# Patient Record
Sex: Female | Born: 1935 | Race: White | Hispanic: No | Smoking: Never smoker
Health system: Southern US, Community
[De-identification: ages and names within clinical notes are randomized; demographics above are authoritative.]

## PROBLEM LIST (undated history)

## (undated) DIAGNOSIS — F0390 Unspecified dementia without behavioral disturbance: Secondary | ICD-10-CM

## (undated) DIAGNOSIS — M810 Age-related osteoporosis without current pathological fracture: Secondary | ICD-10-CM

## (undated) DIAGNOSIS — I4821 Permanent atrial fibrillation: Secondary | ICD-10-CM

## (undated) DIAGNOSIS — W19XXXA Unspecified fall, initial encounter: Secondary | ICD-10-CM

## (undated) DIAGNOSIS — S22000A Wedge compression fracture of unspecified thoracic vertebra, initial encounter for closed fracture: Secondary | ICD-10-CM

## (undated) DIAGNOSIS — I5022 Chronic systolic (congestive) heart failure: Secondary | ICD-10-CM

## (undated) DIAGNOSIS — I110 Hypertensive heart disease with heart failure: Secondary | ICD-10-CM

## (undated) DIAGNOSIS — E785 Hyperlipidemia, unspecified: Secondary | ICD-10-CM

## (undated) DIAGNOSIS — E039 Hypothyroidism, unspecified: Secondary | ICD-10-CM

## (undated) DIAGNOSIS — I272 Pulmonary hypertension, unspecified: Secondary | ICD-10-CM

## (undated) DIAGNOSIS — R600 Localized edema: Secondary | ICD-10-CM

## (undated) DIAGNOSIS — I10 Essential (primary) hypertension: Secondary | ICD-10-CM

## (undated) HISTORY — DX: Permanent atrial fibrillation: I48.21

## (undated) HISTORY — DX: Pulmonary hypertension, unspecified: I27.20

## (undated) HISTORY — DX: Localized edema: R60.0

## (undated) HISTORY — PX: BREAST LUMPECTOMY: SHX2

---

## 1959-08-13 HISTORY — PX: OOPHORECTOMY: SHX86

## 1997-10-31 ENCOUNTER — Other Ambulatory Visit: Admission: RE | Admit: 1997-10-31 | Discharge: 1997-10-31 | Payer: Self-pay | Admitting: Endocrinology

## 1999-01-10 ENCOUNTER — Ambulatory Visit (HOSPITAL_COMMUNITY): Admission: RE | Admit: 1999-01-10 | Discharge: 1999-01-10 | Payer: Self-pay | Admitting: Endocrinology

## 1999-01-10 ENCOUNTER — Encounter: Payer: Self-pay | Admitting: Endocrinology

## 1999-03-01 ENCOUNTER — Other Ambulatory Visit: Admission: RE | Admit: 1999-03-01 | Discharge: 1999-03-01 | Payer: Self-pay | Admitting: Endocrinology

## 1999-08-17 ENCOUNTER — Ambulatory Visit (HOSPITAL_COMMUNITY): Admission: RE | Admit: 1999-08-17 | Discharge: 1999-08-17 | Payer: Self-pay | Admitting: *Deleted

## 1999-08-17 ENCOUNTER — Encounter (INDEPENDENT_AMBULATORY_CARE_PROVIDER_SITE_OTHER): Payer: Self-pay | Admitting: Specialist

## 2000-03-12 ENCOUNTER — Encounter: Payer: Self-pay | Admitting: Endocrinology

## 2000-03-12 ENCOUNTER — Ambulatory Visit (HOSPITAL_COMMUNITY): Admission: RE | Admit: 2000-03-12 | Discharge: 2000-03-12 | Payer: Self-pay | Admitting: Endocrinology

## 2000-03-24 ENCOUNTER — Other Ambulatory Visit: Admission: RE | Admit: 2000-03-24 | Discharge: 2000-03-24 | Payer: Self-pay | Admitting: Endocrinology

## 2001-04-01 ENCOUNTER — Other Ambulatory Visit: Admission: RE | Admit: 2001-04-01 | Discharge: 2001-04-01 | Payer: Self-pay | Admitting: Endocrinology

## 2001-04-08 ENCOUNTER — Ambulatory Visit (HOSPITAL_COMMUNITY): Admission: RE | Admit: 2001-04-08 | Discharge: 2001-04-08 | Payer: Self-pay | Admitting: Endocrinology

## 2001-04-08 ENCOUNTER — Encounter: Payer: Self-pay | Admitting: Endocrinology

## 2001-05-18 ENCOUNTER — Encounter: Payer: Self-pay | Admitting: Endocrinology

## 2001-05-18 ENCOUNTER — Ambulatory Visit (HOSPITAL_COMMUNITY): Admission: RE | Admit: 2001-05-18 | Discharge: 2001-05-18 | Payer: Self-pay | Admitting: Endocrinology

## 2001-05-21 ENCOUNTER — Encounter: Payer: Self-pay | Admitting: Endocrinology

## 2001-05-21 ENCOUNTER — Encounter: Admission: RE | Admit: 2001-05-21 | Discharge: 2001-05-21 | Payer: Self-pay | Admitting: Endocrinology

## 2002-04-05 ENCOUNTER — Other Ambulatory Visit: Admission: RE | Admit: 2002-04-05 | Discharge: 2002-04-05 | Payer: Self-pay | Admitting: Endocrinology

## 2002-06-02 ENCOUNTER — Ambulatory Visit (HOSPITAL_COMMUNITY): Admission: RE | Admit: 2002-06-02 | Discharge: 2002-06-02 | Payer: Self-pay | Admitting: *Deleted

## 2002-06-08 ENCOUNTER — Encounter: Admission: RE | Admit: 2002-06-08 | Discharge: 2002-06-08 | Payer: Self-pay | Admitting: Endocrinology

## 2002-06-08 ENCOUNTER — Encounter: Payer: Self-pay | Admitting: Endocrinology

## 2003-01-13 ENCOUNTER — Emergency Department (HOSPITAL_COMMUNITY): Admission: EM | Admit: 2003-01-13 | Discharge: 2003-01-13 | Payer: Self-pay | Admitting: Emergency Medicine

## 2003-01-13 ENCOUNTER — Encounter: Payer: Self-pay | Admitting: Emergency Medicine

## 2003-06-15 ENCOUNTER — Ambulatory Visit (HOSPITAL_COMMUNITY): Admission: RE | Admit: 2003-06-15 | Discharge: 2003-06-15 | Payer: Self-pay | Admitting: Endocrinology

## 2003-06-29 ENCOUNTER — Encounter: Admission: RE | Admit: 2003-06-29 | Discharge: 2003-06-29 | Payer: Self-pay | Admitting: Endocrinology

## 2004-06-18 ENCOUNTER — Ambulatory Visit (HOSPITAL_COMMUNITY): Admission: RE | Admit: 2004-06-18 | Discharge: 2004-06-18 | Payer: Self-pay | Admitting: Endocrinology

## 2004-06-29 ENCOUNTER — Encounter: Admission: RE | Admit: 2004-06-29 | Discharge: 2004-06-29 | Payer: Self-pay | Admitting: Endocrinology

## 2005-04-25 ENCOUNTER — Encounter: Admission: RE | Admit: 2005-04-25 | Discharge: 2005-04-25 | Payer: Self-pay | Admitting: Endocrinology

## 2005-08-14 ENCOUNTER — Encounter: Admission: RE | Admit: 2005-08-14 | Discharge: 2005-08-14 | Payer: Self-pay | Admitting: Endocrinology

## 2005-09-02 ENCOUNTER — Encounter: Admission: RE | Admit: 2005-09-02 | Discharge: 2005-09-02 | Payer: Self-pay | Admitting: Endocrinology

## 2006-08-12 HISTORY — PX: CATARACT EXTRACTION: SUR2

## 2007-07-24 ENCOUNTER — Ambulatory Visit (HOSPITAL_COMMUNITY): Admission: RE | Admit: 2007-07-24 | Discharge: 2007-07-24 | Payer: Self-pay | Admitting: *Deleted

## 2007-07-24 ENCOUNTER — Encounter (INDEPENDENT_AMBULATORY_CARE_PROVIDER_SITE_OTHER): Payer: Self-pay | Admitting: *Deleted

## 2007-08-07 ENCOUNTER — Encounter: Admission: RE | Admit: 2007-08-07 | Discharge: 2007-08-07 | Payer: Self-pay | Admitting: Endocrinology

## 2008-08-08 ENCOUNTER — Encounter: Admission: RE | Admit: 2008-08-08 | Discharge: 2008-08-08 | Payer: Self-pay | Admitting: Endocrinology

## 2009-05-12 ENCOUNTER — Encounter: Admission: RE | Admit: 2009-05-12 | Discharge: 2009-05-12 | Payer: Self-pay | Admitting: Endocrinology

## 2009-08-25 ENCOUNTER — Encounter: Admission: RE | Admit: 2009-08-25 | Discharge: 2009-08-25 | Payer: Self-pay | Admitting: Endocrinology

## 2009-08-31 ENCOUNTER — Encounter: Admission: RE | Admit: 2009-08-31 | Discharge: 2009-08-31 | Payer: Self-pay | Admitting: Endocrinology

## 2010-01-31 ENCOUNTER — Encounter: Payer: Self-pay | Admitting: Pulmonary Disease

## 2010-02-15 ENCOUNTER — Encounter: Payer: Self-pay | Admitting: Pulmonary Disease

## 2010-02-15 ENCOUNTER — Ambulatory Visit (HOSPITAL_COMMUNITY): Admission: RE | Admit: 2010-02-15 | Discharge: 2010-02-15 | Payer: Self-pay | Admitting: Cardiovascular Disease

## 2010-03-05 ENCOUNTER — Encounter: Payer: Self-pay | Admitting: Pulmonary Disease

## 2010-03-05 ENCOUNTER — Encounter: Admission: RE | Admit: 2010-03-05 | Discharge: 2010-03-05 | Payer: Self-pay | Admitting: Cardiovascular Disease

## 2010-03-21 DIAGNOSIS — I4821 Permanent atrial fibrillation: Secondary | ICD-10-CM | POA: Insufficient documentation

## 2010-03-22 ENCOUNTER — Ambulatory Visit: Payer: Self-pay | Admitting: Pulmonary Disease

## 2010-03-22 DIAGNOSIS — R0609 Other forms of dyspnea: Secondary | ICD-10-CM | POA: Insufficient documentation

## 2010-03-22 DIAGNOSIS — I10 Essential (primary) hypertension: Secondary | ICD-10-CM | POA: Insufficient documentation

## 2010-03-22 DIAGNOSIS — R0989 Other specified symptoms and signs involving the circulatory and respiratory systems: Secondary | ICD-10-CM

## 2010-03-22 DIAGNOSIS — I2721 Secondary pulmonary arterial hypertension: Secondary | ICD-10-CM | POA: Insufficient documentation

## 2010-03-22 DIAGNOSIS — J309 Allergic rhinitis, unspecified: Secondary | ICD-10-CM | POA: Insufficient documentation

## 2010-03-23 ENCOUNTER — Ambulatory Visit: Payer: Self-pay | Admitting: Internal Medicine

## 2010-04-05 ENCOUNTER — Ambulatory Visit: Payer: Self-pay | Admitting: Pulmonary Disease

## 2010-04-24 ENCOUNTER — Telehealth (INDEPENDENT_AMBULATORY_CARE_PROVIDER_SITE_OTHER): Payer: Self-pay | Admitting: *Deleted

## 2010-05-07 ENCOUNTER — Telehealth (INDEPENDENT_AMBULATORY_CARE_PROVIDER_SITE_OTHER): Payer: Self-pay | Admitting: *Deleted

## 2010-05-16 ENCOUNTER — Ambulatory Visit (HOSPITAL_COMMUNITY): Admission: RE | Admit: 2010-05-16 | Discharge: 2010-05-16 | Payer: Self-pay | Admitting: Cardiovascular Disease

## 2010-05-16 ENCOUNTER — Encounter (INDEPENDENT_AMBULATORY_CARE_PROVIDER_SITE_OTHER): Payer: Self-pay | Admitting: Internal Medicine

## 2010-06-08 ENCOUNTER — Encounter: Admission: RE | Admit: 2010-06-08 | Discharge: 2010-06-08 | Payer: Self-pay | Admitting: Cardiovascular Disease

## 2010-06-13 ENCOUNTER — Ambulatory Visit (HOSPITAL_COMMUNITY): Admission: RE | Admit: 2010-06-13 | Discharge: 2010-06-14 | Payer: Self-pay | Admitting: Cardiovascular Disease

## 2010-06-13 HISTORY — PX: CARDIAC CATHETERIZATION: SHX172

## 2010-06-20 ENCOUNTER — Encounter: Payer: Self-pay | Admitting: Pulmonary Disease

## 2010-07-27 ENCOUNTER — Ambulatory Visit: Payer: Self-pay | Admitting: Pulmonary Disease

## 2010-07-30 ENCOUNTER — Telehealth: Payer: Self-pay | Admitting: Pulmonary Disease

## 2010-08-24 ENCOUNTER — Ambulatory Visit
Admission: RE | Admit: 2010-08-24 | Discharge: 2010-08-24 | Payer: Self-pay | Source: Home / Self Care | Attending: Pulmonary Disease | Admitting: Pulmonary Disease

## 2010-09-11 NOTE — Progress Notes (Signed)
Summary: PT HAS STOPPED USING FORADIL  Phone Note Call from Patient Call back at Home Phone [phone]   Caller: Patient Call For: [facility] Summary of Call: pt states that since speaking to nurse on 9/13 (re: foradil) pt had problems w/ foradil. says that her bp on 9/20 was 159 / 102 and pulse was 67. pt says that on 9/19 she was very dizzy after using foradil and so she has not used it since 9/20. pt also states that she had mucle cramps in both arms after taking the foradil 9/19 and still had them the next day as well. (hasn't had since).  Initial call taken by: Tivis Ringer, CNA,  May 07, 2010 2:37 PM  Follow-up for Phone Call        called and spoke with pt.  pt states when she last called KC to give an update on Foradil (see phone note from 9-13 for complete details) pt states the Foradil was helping her breathing.  However, pt states on 04/30/2010 she took her morning dose of Foradil and states shortly after that she got dizzy and noticed muscle cramps in both arms.  Her bp was also elevated to 159/102.  Pt states she stopped taking the Foradil after this happened (hasn't restarted) and states her dizziness is gone, muscle cramps are gone and bp is back down to "normal."  Pt wonders if these were side effects to the [place].  pt would like to try something different if possible. Will forward message to [facility] to address.  Aundra Millet Reynolds LPN  May 07, 2010 2:54 PM   Additional Follow-up for Phone Call Additional follow up Details #1::        tell her it is ok to stay off foradil.  I do not think her lungs are the main culprit with her sob. Additional Follow-up by: Barbaraann Share MD,  May 07, 2010 4:52 PM    Additional Follow-up for Phone Call Additional follow up Details #2::    called and spoke with pt.  pt aware of KC's recs to ok to stay of Foradil.  Pt verbalized understanding.  Instructed pt to call our office if she has increased sob and feels she needs to be seen  sooner than her scheduled ov with [facility] in Jan 2012.  Pt was agreeable to this.  Aundra Millet Reynolds LPN  May 07, 2010 4:58 PM

## 2010-09-11 NOTE — Progress Notes (Signed)
Summary: foradil helping dyspnea  Phone Note Call from Patient   Caller: Patient Call For: clance Summary of Call: pt was told to call with update on foradil , breathing better should she continue taking . has note been sent to cardiologist? Initial call taken by: Rickard Patience,  April 24, 2010 10:58 AM  Follow-up for Phone Call        pt states she has seen some inprovement on foradil she states on a scale of 1-10 one being no SOB, 10 being the worst ever, she states she feels she is between a 4-5. She is able to walk further before she has to stop when she is exercising. Pt is requesting refill of foradil be sent to [facility] [address]. Carron Curie CMA  April 24, 2010 11:05 AM   Additional Follow-up for Phone Call Additional follow up Details #1::        let her know that a note has already been sent to cardiologist. she can have a refill on foradil #60, 3 fills she needs ov with me in 4mos to check on her progress. Additional Follow-up by: Barbaraann Share MD,  April 25, 2010 4:18 PM    Additional Follow-up for Phone Call Additional follow up Details #2::    Rx sent.  Called, spoke with pt. Informed her of above per KC.  She verbalized understanding.  ROV scheduled for 1.13.12 at 11am -- pt aware.   Follow-up by: Gweneth Dimitri RN,  April 25, 2010 4:27 PM  New/Updated Medications: FORADIL AEROLIZER 12 MCG CAPS (FORMOTEROL FUMARATE) Inhale 1 inhalation in am and in the pm Prescriptions: FORADIL AEROLIZER 12 MCG CAPS (FORMOTEROL FUMARATE) Inhale 1 inhalation in am and in the pm  #60 x 3   Entered by:   Gweneth Dimitri RN   Authorized by:   Barbaraann Share MD   Signed by:   Gweneth Dimitri RN on 04/25/2010   Method used:   Electronically to        [facility] [phone]* (retail)       [address]       [place], [place]  [address]       Ph: [phone] or [phone]       Fax: [phone]   RxID:   [id number]

## 2010-09-11 NOTE — Miscellaneous (Signed)
Summary: Orders Update pft charges  Clinical Lists Changes  Orders: Added new Service order of Carbon Monoxide diffusing w/capacity (94720) - Signed Added new Service order of Lung Volumes (94240) - Signed Added new Service order of Spirometry (Pre & Post) (94060) - Signed 

## 2010-09-11 NOTE — Assessment & Plan Note (Signed)
Summary: rov for dyspnea   Visit Type:  Follow-up Copy to:  [facility] Primary Provider/Referring Provider:  Dr. Juleen China  CC:  Dyspnea follow-up with PFT's.  the patient says her breathing is the same...no better or worse.Marland Kitchen  History of Present Illness: The pt comes in today for f/u of her recent ct chest and pfts as part of a w/u for doe.  She was found to have no ISLD on HRCT, and minimal scarring.  She did have a 3mm nodule noted, and significant kyphosis.  Her pfts showed minimal obstruction, no restriction, and a moderate decrease in dlco which corrected with alveolar volume adjustment.  I have reviewed the data in detail with her, and answered all of her questions.  Current Medications (verified): 1)  Digoxin 0.125 Mg Tabs (Digoxin) .... Take 1 Tablet By Mouth Once A Day 2)  Simvastatin 40 Mg Tabs (Simvastatin) .... Take 1 Tablet By Mouth Once A Day 3)  Metoprolol Tartrate 100 Mg Tabs (Metoprolol Tartrate) .... Take 1 Tablet By Mouth Two Times A Day 4)  Synthroid 125 Mcg Tabs (Levothyroxine Sodium) .... Take 1 Tablet By Mouth Once A Day 5)  Coumadin 2 Mg Tabs (Warfarin Sodium) .... Take As Directed By Coumadin Clinic 6)  Furosemide 40 Mg Tabs (Furosemide) .... Take 1 Tablet By Mouth Once A Day 7)  Fish Oil 1200 Mg Caps (Omega-3 Fatty Acids) .... Take 1 Tablet By Mouth Once A Day 8)  Calcium Plus Vitamin D3 600-500 Mg-Unit Caps (Calcium Carb-Cholecalciferol) .... Take 1 Tablet By Mouth Two Times A Day  Allergies (verified): No Known Drug Allergies  Past History:  Past medical, surgical, family and social histories (including risk factors) reviewed, and no changes noted (except as noted below).  Past Medical History: Reviewed history from 03/22/2010 and no changes required.  HYPERTENSION (ICD-401.9) ALLERGIC RHINITIS (ICD-477.9) Hx of ATRIAL FIBRILLATION (ICD-427.31)    Past Surgical History: Reviewed history from 03/22/2010 and no changes required. L breast surgery  1970s L ovary and tube removed 1974  Family History: Reviewed history from 03/22/2010 and no changes required. heart disease: mother- deceased at age 39 cancer: brother (melanoma) TB: paternal uncle  Social History: Reviewed history from 03/22/2010 and no changes required. Patient never smoked.  pt is widowed and lives alone. pt had 1 adopted son, now deceased. pt is retired.  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   Review of Systems       The patient complains of irregular heartbeats, hand/feet swelling, and joint stiffness or pain.  The patient denies shortness of breath with activity, shortness of breath at rest, productive cough, non-productive cough, coughing up blood, chest pain, acid heartburn, indigestion, loss of appetite, weight change, abdominal pain, difficulty swallowing, sore throat, tooth/dental problems, headaches, nasal congestion/difficulty breathing through nose, sneezing, itching, ear ache, anxiety, depression, rash, change in color of mucus, and fever.    Vital Signs:  Patient profile:   77 year old female Height:      63 inches (160.02 cm) Weight:      157 pounds (71.36 kg) BMI:     27.91 O2 Sat:      95 % on Room air Temp:     97.7 degrees F (36.50 degrees C) oral Pulse rate:   101 / minute BP sitting:   128 / 78  (left arm) Cuff size:   regular  Vitals Entered By: Michel Bickers CMA (April 05, 2010 9:56 AM)  O2 Sat  at Rest %:  95 O2 Flow:  Room air CC: Dyspnea follow-up with PFT's.  the patient says her breathing is the same...no better or worse. Comments Medications reviewed with the patient. Daytime phone verified. Michel Bickers CMA  April 05, 2010 10:03 AM   Physical Exam  General:  wd female in nad Lungs:  faint basilar crackles, no wheezing or rhonchi Heart:  irreg rhythm with rate approx 110. Extremities:  + edema, no cyanosis  Neurologic:  alert and oriented, moves all 4.   Impression &  Recommendations:  Problem # 1:  DYSPNEA ON EXERTION (ICD-786.09) the pt has no ISLD on HRCT, and her pfts show minimal obstruction and no restricton.  I suspect her degree of obstruction has nothing to do with her symptom of dyspnea.  I am willing to give her a trial of a LABA to see if she sees improvement in her breathing, but if she does not, no further pulmonary workup is recommended.  I believe she has pulmonary venous htn that is cardiac in origin, but will leave the decision regarding right heart cath to her cardiologist.    Other Orders: Est. Patient Level IV ([address])  Patient Instructions: 1)  will give you a trial of foradil  one inhalation am and pm for next 2 weeks.  Please call and let me know if you think things are improved. 2)  will send a note back to your cardiologist detailing my findings so that he can review.

## 2010-09-11 NOTE — Assessment & Plan Note (Signed)
Summary: consult for dyspnea   Copy to:  [facility] Primary Provider/Referring Provider:  Dr. Juleen China  CC:  Pulmonary Consult.  History of Present Illness: The pt is an 77y/o female who I have been asked to see for dyspnea.  She has noticed over the last 2 years a gradual onset of doe.  She has no sob at rest, and is able to walk distances if she is able to take breaks and rest stops.  She describes a one block doe on flat ground at a moderate pace, and will get winded bringing groceries in from the car and with sweeping a room of her house.  She denies any cough or congestion, and has no mucus production.  She has had a recent cxr which shows prominent bronchovascular markings bilaterally, CM, and significant kyphosis.  She has had a recent echo which showed normal LV systolic function, but ?impaired relaxation.  Her LA was moderate to severely dilated, and her PA pressure was estimated at 36mm (mildly elevated).  She has also had spirometry which shows a decrease in her FVC and FEV1, but her ratio was normal.  She did not have an expiratory time of 6 seconds, therefore the results are essentially uninterpretable.  The pt is unsure if she has ever been on amiodarone, but denies chronic macrodantin use.  She has not had any unusual occupational exposures.  She denies any h/o autoimmune disease, and only has minimal arthritis symptoms.  She has noted an unusual feeling in her rib cage during breathing, and felt it was due to her worsening spine issues.  Preventive Screening-Counseling & Management  Alcohol-Tobacco     Smoking Status: never  Medications Prior to Update: 1)  Digoxin 0.125 Mg Tabs (Digoxin) .... Take 1 Tablet By Mouth Once A Day 2)  Simvastatin 40 Mg Tabs (Simvastatin) .... Take 1 Tablet By Mouth Once A Day 3)  Metoprolol Tartrate 100 Mg Tabs (Metoprolol Tartrate) .... Take 1 Tablet By Mouth Two Times A Day 4)  Synthroid 125 Mcg Tabs (Levothyroxine Sodium) .... Take 1 Tablet By  Mouth Once A Day 5)  Coumadin 2 Mg Tabs (Warfarin Sodium) .... Take As Directed By Coumadin Clinic 6)  Furosemide 40 Mg Tabs (Furosemide) .... Take 1 Tablet By Mouth Once A Day 7)  Fish Oil 1200 Mg Caps (Omega-3 Fatty Acids) .... Take 1 Tablet By Mouth Once A Day 8)  Calcium Plus Vitamin D3 600-500 Mg-Unit Caps (Calcium Carb-Cholecalciferol) .... Take 1 Tablet By Mouth Two Times A Day  Allergies (verified): No Known Drug Allergies  Past History:  Past Medical History:  HYPERTENSION (ICD-401.9) ALLERGIC RHINITIS (ICD-477.9) Hx of ATRIAL FIBRILLATION (ICD-427.31)    Past Surgical History: L breast surgery 1970s L ovary and tube removed 1974  Family History: heart disease: mother- deceased at age 53 cancer: brother (melanoma) TB: paternal uncle  Social History: Patient never smoked.  pt is widowed and lives alone. pt had 1 adopted son, now deceased. pt is retired.  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 Smoking Status:  never  Review of Systems       The patient complains of shortness of breath with activity, irregular heartbeats, loss of appetite, sneezing, itching, and hand/feet swelling.  The patient denies shortness of breath at rest, productive cough, non-productive cough, coughing up blood, chest pain, acid heartburn, indigestion, weight change, abdominal pain, difficulty swallowing, sore throat, tooth/dental problems, headaches, nasal congestion/difficulty breathing through nose, ear ache, anxiety, depression,  joint stiffness or pain, rash, change in color of mucus, and fever.    Vital Signs:  Patient profile:   77 year old female Height:      63 inches Weight:      156.50 pounds BMI:     27.82 O2 Sat:      94 % on Room air Temp:     97.8 degrees F oral Pulse rate:   94 / minute BP sitting:   132 / 76  (left arm) Cuff size:   regular  Vitals Entered By: Arman Filter LPN (March 22, 2010 9:48 AM)  O2 Flow:  Room air CC:  Pulmonary Consult Comments Medications reviewed with patient Arman Filter LPN  March 22, 2010 9:48 AM    Physical Exam  General:  ow female in nad Eyes:  PERRLA and EOMI.   Nose:  narrowed but patent Mouth:  clear  Neck:  no jvd, LN.  mild prominence of thyroid Chest Wall:  significant kyphosis Lungs:  minimal left basilar crackles, decreased depth inspiration, otherwise clear Heart:  irreg rate and rhythm, cvr. Abdomen:  soft and nontender, bs+ Extremities:  1-2+  edema, no cyanosis pulses intact distally Neurologic:  alert and oriented, moves    Impression & Recommendations:  Problem # 1:  DYSPNEA ON EXERTION (ICD-786.09) the pt has significant doe, but it is unclear if this is due to a pulmonary issue.  Her spirometry is really uninterpretable due to effort and technique, and she will need full pfts anyway for evaluation.  Her cxr shows prominent bronchovascular markings, commonly seen in pulmonary venous hypertension ( esp. seen with MV disease), but I will reserve judgement until I see a HRCT to evaluate lung parenchyma.  She also has signficant kyphosis, and this is a common cause of dyspnea related to restictive disease as well.  Will see the pt back once her ct and full pfts are done.  Problem # 2:  PULMONARY HYPERTENSION, SECONDARY (ICD-416.8) the pt has mildly elevated pulmonary artery pressures by echo, and in the face of a significantly dilated LA, is most likely pulmonary VENOUS in origin.  This could be verified by right heart catheterization.    Other Orders: Consultation Level V [phone]) Radiology Referral (Radiology) Pulmonary Referral (Pulmonary)  Patient Instructions: 1)  will check a scan of lung to evaluate for possible scarring 2)  will schedule for full breathing tests, and see you back on same day to review.

## 2010-09-11 NOTE — Letter (Signed)
Summary: [facility]  [facility]   Imported By: Lester Grayhawk 03/28/2010 08:57:14  _____________________________________________________________________  External Attachment:    Type:   Image     Comment:   External Document

## 2010-09-13 NOTE — Progress Notes (Signed)
Summary: nos appt  Phone Note Call from Patient   Caller: [email or web address]  Call For: clance Summary of Call: In ref to nos from 12/16, pt has appt on 1/13. Initial call taken by: Darletta Moll,  July 30, 2010 11:29 AM

## 2010-09-13 NOTE — Letter (Signed)
Summary: [facility]  [facility]   Imported By: Lester Weston 07/25/2010 10:39:11  _____________________________________________________________________  External Attachment:    Type:   Image     Comment:   External Document

## 2010-09-13 NOTE — Assessment & Plan Note (Signed)
Summary: rov for dyspnea   Visit Type:  Follow-up Copy to:  [facility] Primary Provider/Referring Provider:  Dr. Juleen China  CC:  4 month follow up. Pt states her breathing has been doing "good". Pt denies any cough. Pt states she wheezes when she is lying flat on her back. . Pt states she stopped her inhaler bc she had hand pain. Pt is never smoker.  History of Present Illness: The pt comes in today for f/u of her doe.  She has had pfts and other pulmonary w/u, with very mild airflow obstruction on pfts.  She was tried on foradil and felt there was some improvement in her breathing, but did not think the change was worth the side effects from the medication.  She has also had a right heart cath, and this showed mild pulmonary htn (mean 30) and mildly elevated PCWP.  She has a history of mitral insuff. that is mild to moderate by echo, and did have a mild v-wave at RHC of 21mm.  Her PVR calculated to be < 3 wood units, which really doesn't qualify her for vasodilator medications, especially given her age and mild pulmonary htn.  The pt denies any cough or congestion at this time.  Current Medications (verified): 1)  Digoxin 0.125 Mg Tabs (Digoxin) .... Take 1 Tablet By Mouth Once A Day 2)  Simvastatin 40 Mg Tabs (Simvastatin) .... Take 1 Tablet By Mouth Once A Day 3)  Metoprolol Tartrate 100 Mg Tabs (Metoprolol Tartrate) .... Take 1 Tablet By Mouth Two Times A Day 4)  Synthroid 125 Mcg Tabs (Levothyroxine Sodium) .... Take 1 Tablet By Mouth Once A Day 5)  Coumadin 2 Mg Tabs (Warfarin Sodium) .... Take As Directed By Coumadin Clinic.Marland Kitchen 4 Mg On Mondays and 2 Mg The Rest of The Week 6)  Furosemide 40 Mg Tabs (Furosemide) .... Take 1 Tablet By Mouth Once A Day 7)  Fish Oil 1200 Mg Caps (Omega-3 Fatty Acids) .... Take 1 Tablet By Mouth Once A Day 8)  Calcium Plus Vitamin D3 600-500 Mg-Unit Caps (Calcium Carb-Cholecalciferol) .... Take 1 Tablet By Mouth Two Times A Day 9)  Tylenol Extra Strength 500 Mg  Tabs (Acetaminophen) .... As Needed  Allergies (verified): No Known Drug Allergies  Past History:  Past medical, surgical, family and social histories (including risk factors) reviewed, and no changes noted (except as noted below).  Past Medical History:  HYPERTENSION (ICD-401.9) ALLERGIC RHINITIS (ICD-477.9) Hx of ATRIAL FIBRILLATION (ICD-427.31) DOE--ct chest with ISLD, pfts with mild obst, minimal restriction, and mod decrease dlco.  On coumadin            chronically for afib.  RHC with pcwp 17, v wave 21, mean pa 30, pvr 265 wood units    Past Surgical History: Reviewed history from 03/22/2010 and no changes required. L breast surgery 1970s L ovary and tube removed 1974  Family History: Reviewed history from 03/22/2010 and no changes required. heart disease: mother- deceased at age 53 cancer: brother (melanoma) TB: paternal uncle  Social History: Reviewed history from 03/22/2010 and no changes required. Patient never smoked.  pt is widowed and lives alone. pt had 1 adopted son, now deceased. pt is retired.  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   Review of Systems       The patient complains of shortness of breath with activity, weight change, nasal congestion/difficulty breathing through nose, and joint stiffness or pain.  The patient denies shortness  of breath at rest, productive cough, non-productive cough, coughing up blood, chest pain, irregular heartbeats, acid heartburn, indigestion, loss of appetite, abdominal pain, difficulty swallowing, sore throat, tooth/dental problems, headaches, sneezing, itching, ear ache, anxiety, depression, hand/feet swelling, rash, change in color of mucus, and fever.    Vital Signs:  Patient profile:   77 year old female Height:      63 inches Weight:      149 pounds BMI:     26.49 O2 Sat:      96 % on Room air Temp:     98.4 degrees F oral Pulse rate:   79 / minute BP sitting:   124 / 78   (left arm) Cuff size:   regular  Vitals Entered By: Carver Fila (August 24, 2010 11:06 AM)  O2 Flow:  Room air CC: 4 month follow up. Pt states her breathing has been doing "good". Pt denies any cough. Pt states she wheezes when she is lying flat on her back. . Pt states she stopped her inhaler bc she had hand pain. Pt is never smoker Comments meds and allergies updated Phone number updated Carver Fila  August 24, 2010 11:06 AM    Physical Exam  General:  wd female in nad Nose:  no purulence or discharge noted. Lungs:  totally clear to auscultation Heart:  rrr Extremities:  1+ edema LE, no cyanosis  Neurologic:  alert and oriented, moves all 4.   Impression & Recommendations:  Problem # 1:  DYSPNEA ON EXERTION (ICD-786.09) Assessment Deteriorated I think the pt's doe is multifactorial.  She does have very mild obstructive disease with some response to bronchodilators, but does not want to take chronic meds due to side effects.  The pt also is deconditioned, and has mild pulmonary htn with mitral insuff.  Her pulmonary htn is not overly significant, and given her age would not treat with vasodilators.  I would be curious to know what her V-wave is during exertional activities, and whether this is a larger part of her sob than we think.  I have recommended to her that she have a rescue inhaler available to use as needed.  The only other thing I would consider is whether her Beta Blocker dose should be decreased given her mild obstructive disease??  Will leave that to her cardiologist and primary md.  Problem # 2:  PULMONARY HYPERTENSION, SECONDARY (ICD-416.8) as above.  Medications Added to Medication List This Visit: 1)  Coumadin 2 Mg Tabs (Warfarin sodium) .... Take as directed by coumadin clinic.. 4 mg on mondays and 2 mg the rest of the week 2)  Tylenol Extra Strength 500 Mg Tabs (Acetaminophen) .... As needed 3)  Ventolin Hfa 108 (90 Base) Mcg/act Aers (Albuterol sulfate) ....  2 puffs every 4-6 hours as needed  Other Orders: Est. Patient Level IV (75643)  Patient Instructions: 1)  stay off foradil 2)  will start on ventolin inhaler, 2 puffs up to every 6hrs if needed for worsening sob or emergencies. 3)  work on increasing activities. 4)  would be happy to see again if having worsening symptoms.  Just let me know.  Prescriptions: VENTOLIN HFA 108 (90 BASE) MCG/ACT  AERS (ALBUTEROL SULFATE) 2 puffs every 4-6 hours as needed  #1 x 6   Entered and Authorized by:   Barbaraann Share MD   Signed by:   Barbaraann Share MD on 08/24/2010   Method used:   Print then Give to Patient  RxID:   [id number]    Immunization History:  Influenza Immunization History:    Influenza:  historical (05/12/2010)

## 2010-10-08 ENCOUNTER — Other Ambulatory Visit: Payer: Self-pay | Admitting: Endocrinology

## 2010-10-08 DIAGNOSIS — Z1231 Encounter for screening mammogram for malignant neoplasm of breast: Secondary | ICD-10-CM

## 2010-10-23 ENCOUNTER — Ambulatory Visit
Admission: RE | Admit: 2010-10-23 | Discharge: 2010-10-23 | Disposition: A | Discharge status: Home / Self Care | Payer: Medicare HMO | Source: Ambulatory Visit | Attending: Endocrinology | Admitting: Endocrinology

## 2010-10-23 DIAGNOSIS — Z1231 Encounter for screening mammogram for malignant neoplasm of breast: Secondary | ICD-10-CM

## 2010-10-23 LAB — PROTIME-INR
INR: 1 (ref 0.00–1.49)
INR: 1.07 (ref 0.00–1.49)
Prothrombin Time: 13.4 seconds (ref 11.6–15.2)
Prothrombin Time: 14.1 seconds (ref 11.6–15.2)

## 2010-10-23 LAB — POCT I-STAT 3, VENOUS BLOOD GAS (G3P V)
Acid-Base Excess: 2 mmol/L (ref 0.0–2.0)
Acid-base deficit: 2 mmol/L (ref 0.0–2.0)
Bicarbonate: 23.5 mEq/L (ref 20.0–24.0)
Bicarbonate: 26.8 mEq/L — ABNORMAL HIGH (ref 20.0–24.0)
O2 Saturation: 66 %
O2 Saturation: 69 %
TCO2: 25 mmol/L (ref 0–100)
TCO2: 28 mmol/L (ref 0–100)
pCO2, Ven: 42.4 mmHg — ABNORMAL LOW (ref 45.0–50.0)
pCO2, Ven: 42.7 mmHg — ABNORMAL LOW (ref 45.0–50.0)
pH, Ven: 7.352 — ABNORMAL HIGH (ref 7.250–7.300)
pH, Ven: 7.406 — ABNORMAL HIGH (ref 7.250–7.300)
pO2, Ven: 36 mmHg (ref 30.0–45.0)
pO2, Ven: 36 mmHg (ref 30.0–45.0)

## 2010-10-23 LAB — BASIC METABOLIC PANEL
BUN: 10 mg/dL (ref 6–23)
CO2: 29 mEq/L (ref 19–32)
Calcium: 8.9 mg/dL (ref 8.4–10.5)
Chloride: 108 mEq/L (ref 96–112)
Creatinine, Ser: 0.78 mg/dL (ref 0.4–1.2)
GFR calc Af Amer: >60 mL/min (ref >60)
GFR calc non Af Amer: >60 mL/min (ref >60)
Glucose, Bld: 97 mg/dL (ref 70–99)
Potassium: 3.6 mEq/L (ref 3.5–5.1)
Sodium: 142 mEq/L (ref 135–145)

## 2010-10-23 LAB — POCT I-STAT 3, ART BLOOD GAS (G3+)
Acid-Base Excess: 1 mmol/L (ref 0.0–2.0)
Bicarbonate: 24.7 mEq/L — ABNORMAL HIGH (ref 20.0–24.0)
O2 Saturation: 95 %
TCO2: 26 mmol/L (ref 0–100)
pCO2 arterial: 37 mmHg (ref 35.0–45.0)
pH, Arterial: 7.433 — ABNORMAL HIGH (ref 7.350–7.400)
pO2, Arterial: 71 mmHg — ABNORMAL LOW (ref 80.0–100.0)

## 2010-10-23 LAB — CBC
HCT: 35.4 % — ABNORMAL LOW (ref 36.0–46.0)
Hemoglobin: 11.6 g/dL — ABNORMAL LOW (ref 12.0–15.0)
MCH: 29.5 pg (ref 26.0–34.0)
MCHC: 32.8 g/dL (ref 30.0–36.0)
MCV: 90.1 fL (ref 78.0–100.0)
Platelets: 160 10*3/uL (ref 150–400)
RBC: 3.93 MIL/uL (ref 3.87–5.11)
RDW: 14.1 % (ref 11.5–15.5)
WBC: 5.2 10*3/uL (ref 4.0–10.5)

## 2010-11-14 ENCOUNTER — Other Ambulatory Visit: Payer: Self-pay | Admitting: Endocrinology

## 2010-11-14 DIAGNOSIS — E041 Nontoxic single thyroid nodule: Secondary | ICD-10-CM

## 2010-11-16 ENCOUNTER — Ambulatory Visit
Admission: RE | Admit: 2010-11-16 | Discharge: 2010-11-16 | Disposition: A | Discharge status: Home / Self Care | Payer: Medicare HMO | Source: Ambulatory Visit | Attending: Endocrinology | Admitting: Endocrinology

## 2010-11-16 DIAGNOSIS — E041 Nontoxic single thyroid nodule: Secondary | ICD-10-CM

## 2010-12-25 NOTE — Op Note (Signed)
NAME:  Wendy Flowers, Wendy Flowers                   ACCOUNT NO.:  [id number]   MEDICAL RECORD NO.:  [id number]          PATIENT TYPE:  AMB   LOCATION:  ENDO                         FACILITY:  [facility]   PHYSICIAN:  Georgiana Spinner, M.D.    DATE OF BIRTH:  [birth year]   DATE OF PROCEDURE:  DATE OF DISCHARGE:                               OPERATIVE REPORT   PROCEDURE:  Colonoscopy.   INDICATIONS:  Colon polyps.   ANESTHESIA:  Fentanyl 70 mcg, Versed 5 mg.   PROCEDURE:  With the patient mildly sedated in the left lateral  decubitus position, the Pentax videoscopic colonoscope was inserted in  the rectum and passed under direct vision through a diverticular filled  sigmoid colon to reach the cecum, identified by ileocecal valve and  appendiceal orifice, both of which were photographed.  From this point  the colonoscope was slowly withdrawn, taking circumferential views of  the colonic mucosa, stopping in the descending colon where two small  polyps were seen, photographed, and removed using hot biopsy forceps  technique, setting of 20/150 blended current.  We then stopped in the  rectum which appeared normal on direct and showed hemorrhoids on  retroflexed view.  The endoscope was straightened and withdrawn.  The  patient's vital signs and pulse oximeter remained stable.  The patient  tolerated the procedure well without apparent complications.   FINDINGS:  1. Significant diverticulosis of the sigmoid colon.  2. Polyps of the descending colon.  3. Internal hemorrhoids.   PLAN:  Await biopsy report.  The patient will call me for results and  follow up with me as an outpatient.  Of note, the polyp tissue bled just  from touching the polyp, so we will check a pro time to determine the  anticoagulation status before recommending when to restart the Coumadin.           ______________________________  Georgiana Spinner, M.D.     GMO/MEDQ  D:  07/24/2007  T:  07/24/2007  Job:  [id number]

## 2010-12-28 NOTE — Op Note (Signed)
   NAME:  Wendy Flowers, Wendy Flowers                             ACCOUNT NO.:  [id number]   MEDICAL RECORD NO.:  [id number]                   PATIENT TYPE:  AMB   LOCATION:  ENDO                                 FACILITY:  [facility]   PHYSICIAN:  Georgiana Spinner, M.D.                 DATE OF BIRTH:  [birth year]   DATE OF PROCEDURE:  DATE OF DISCHARGE:                                 OPERATIVE REPORT   PROCEDURE:  Colonoscopy.   INDICATIONS FOR PROCEDURE:  Colon polyps.   ANESTHESIA:  Demerol 50, Versed 6 mg.   DESCRIPTION OF PROCEDURE:  With the patient mildly sedated in the left  lateral decubitus position, the Olympus videoscopic colonoscope was inserted  in the rectum and passed through a tortuous diverticular filled sigmoid  colon to the cecum identified by the ileocecal valve and appendiceal orifice  both of which were photographed. From this point, the colonoscope was slowly  withdrawn taking circumferential views of the entire colonic mucosa stopping  in the rectum which appeared normal on direct and retroflexed view. The  endoscope was straightened and withdrawn. The patient's vital signs and  pulse oximeter remained stable. The patient tolerated the procedure well  without apparent complications.   FINDINGS:  Diverticulosis of sigmoid colon, otherwise, unremarkable exam.   PLAN:  Repeat examination in 5-10 years.                                                 Georgiana Spinner, M.D.    GMO/MEDQ  D:  06/02/2002  T:  06/02/2002  Job:  [id number]

## 2012-02-04 HISTORY — PX: US ECHOCARDIOGRAPHY: HXRAD669

## 2012-02-14 ENCOUNTER — Other Ambulatory Visit: Payer: Self-pay | Admitting: Endocrinology

## 2012-02-14 DIAGNOSIS — Z1231 Encounter for screening mammogram for malignant neoplasm of breast: Secondary | ICD-10-CM

## 2012-02-25 ENCOUNTER — Ambulatory Visit
Admission: RE | Admit: 2012-02-25 | Discharge: 2012-02-25 | Disposition: A | Discharge status: Home / Self Care | Payer: Medicare HMO | Source: Ambulatory Visit | Attending: Endocrinology | Admitting: Endocrinology

## 2012-02-25 DIAGNOSIS — Z1231 Encounter for screening mammogram for malignant neoplasm of breast: Secondary | ICD-10-CM

## 2013-03-16 ENCOUNTER — Encounter: Payer: Self-pay | Admitting: *Deleted

## 2013-03-17 ENCOUNTER — Ambulatory Visit (INDEPENDENT_AMBULATORY_CARE_PROVIDER_SITE_OTHER): Payer: Medicare HMO | Admitting: Cardiovascular Disease

## 2013-03-17 ENCOUNTER — Encounter: Payer: Self-pay | Admitting: Cardiovascular Disease

## 2013-03-17 VITALS — BP 124/82 | HR 75 | Resp 16 | Ht 61.25 in | Wt 152.8 lb

## 2013-03-17 DIAGNOSIS — R42 Dizziness and giddiness: Secondary | ICD-10-CM

## 2013-03-17 DIAGNOSIS — I4891 Unspecified atrial fibrillation: Secondary | ICD-10-CM

## 2013-03-17 DIAGNOSIS — R0602 Shortness of breath: Secondary | ICD-10-CM

## 2013-03-17 DIAGNOSIS — I2789 Other specified pulmonary heart diseases: Secondary | ICD-10-CM

## 2013-03-17 MED ORDER — MECLIZINE HCL 32 MG PO TABS: 32.0000 mg | ORAL_TABLET | Freq: Three times a day (TID) | ORAL | Status: DC | PRN

## 2013-03-17 NOTE — Patient Instructions (Addendum)
Your physician recommends that you schedule a follow-up appointment in: 6 months - call if you don't start feeling better.  Non-Cardiac CT scanning, (CAT scanning), is a noninvasive, special x-ray that produces cross-sectional images of the body using x-rays and a computer. CT scans help physicians diagnose and treat medical conditions. For some CT exams, a contrast material is used to enhance visibility in the area of the body being studied. CT scans provide greater clarity and reveal more details than regular x-ray exams.   A prescription for Meclinizine has been sent to your pharmacy for pick-up.  Take 3 times a day for dizziness as needed.

## 2013-03-18 ENCOUNTER — Other Ambulatory Visit: Payer: Self-pay | Admitting: Cardiovascular Disease

## 2013-03-18 ENCOUNTER — Encounter: Payer: Self-pay | Admitting: Cardiovascular Disease

## 2013-03-18 DIAGNOSIS — R42 Dizziness and giddiness: Secondary | ICD-10-CM | POA: Insufficient documentation

## 2013-03-18 MED ORDER — MECLIZINE HCL 25 MG PO TABS: 25.0000 mg | ORAL_TABLET | Freq: Three times a day (TID) | ORAL | Status: DC | PRN

## 2013-03-18 NOTE — Assessment & Plan Note (Signed)
She has mild to moderate pulmonary hypertension of uncertain etiology, possibly related to restrictive lung disease secondary to kyphosis. This does not appear to be a progressive problem. There was no evidence of heart failure by right heart catheterization. Degree of dyspnea is unchanged over time

## 2013-03-18 NOTE — Telephone Encounter (Signed)
New Rx sent in (corrected)

## 2013-03-18 NOTE — Progress Notes (Signed)
Patient ID: Wendy Flowers, female   DOB: 07-28-1927, 79 y.o.   MRN: [id number]     Reason for office visit New onset of vertigo  Wendy Flowers has long-standing mild to moderate pulmonary artery hypertension and permanent atrial fibrillation but presents today with complaints of vertigo. She was lying in bed on Monday morning and all of a sudden experienced a sensation of a "jolt". Immediately the room started spinning around her and she seems to very consistently described a counterclockwise pattern of rotation. She had the desire to immediately get out of bed but motion made her symptoms worse. She managed to get out of bed to a chair and sit down and check her heart rate which was 53 beats per minutes. She had mild nausea. She found her gait to be unsteady in her ability to judge distances altered. She seems to be describing some degree of ataxia. Her symptoms have improved but she still feels unsteady and uncoordinated. She did not have any vomiting although she did have some mild nausea at the peak of her symptoms.  Her chronic functional class I shortness of breath has not changed. She has intermittent, mild lower extremity edema, She has not had any bleeding problems while on warfarin anticoagulation. Her anticoagulation is followed by Virgina Evener at [facility].    Allergies  Allergen Reactions  . Foradil (Formoterol)     Current Outpatient Prescriptions  Medication Sig Dispense Refill  . Calcium Carb-Cholecalciferol (CALCIUM + D3) 600-200 MG-UNIT TABS Take 1 tablet by mouth 2 (two) times daily.      . digoxin (LANOXIN) 0.125 MG tablet Take 0.125 mg by mouth daily.      . furosemide (LASIX) 40 MG tablet Take 40 mg by mouth.      . levothyroxine (SYNTHROID, LEVOTHROID) 125 MCG tablet Take 125 mcg by mouth daily before breakfast.      . metoprolol (LOPRESSOR) 100 MG tablet Take 50 mg by mouth 2 (two) times daily.      . simvastatin (ZOCOR) 40 MG tablet Take 40 mg by mouth every evening.       . warfarin (COUMADIN) 4 MG tablet Take 2-4 mg by mouth as directed.      . meclizine (ANTIVERT) 25 MG tablet Take 1 tablet (25 mg total) by mouth 3 (three) times daily as needed.  30 tablet  0   No current facility-administered medications for this visit.    Past Medical History  Diagnosis Date  . Permanent atrial fibrillation   . Pulmonary hypertension   . Lower extremity edema     Past Surgical History  Procedure Laterality Date  . Oophorectomy  1961    left  . Breast lumpectomy  1073    left  . Cataract extraction  2008    bilateral  . Cardiac catheterization  06/13/2010    R & LHC: no CAD  . US echocardiography  02/04/2012    LA mod. dilated,RA mild-mod dilated,mild MR,mod. TR    Family History  Problem Relation Age of Onset  . Heart attack Mother   . Heart attack Father   . Diabetes Brother   . Cancer Brother     History   Social History  . Marital Status: Widowed    Spouse Name: N/A    Number of Children: N/A  . Years of Education: N/A   Occupational History  . Not on file.   Social History Main Topics  . Smoking status: Never Smoker   .  Smokeless tobacco: Never Used  . Alcohol Use: Yes     Comment: seldom; 03/17/2013-occasionally drinks a glass of wine before bed  . Drug Use: No  . Sexually Active: Not on file   Other Topics Concern  . Not on file   Social History Narrative  . No narrative on file    Review of systems: The patient specifically denies any chest pain at rest or with exertion, dyspnea at rest, orthopnea, paroxysmal nocturnal dyspnea, syncope, palpitations, intermittent claudication, unexplained weight gain, cough, hemoptysis or wheezing.  The patient also denies abdominal pain,  vomiting, dysphagia, diarrhea, constipation, polyuria, polydipsia, dysuria, hematuria, frequency, urgency, abnormal bleeding or bruising, fever, chills, unexpected weight changes, mood swings, change in skin or hair texture, change in voice quality, auditory  or visual problems, allergic reactions or rashes, new musculoskeletal complaints other than usual "aches and pains".   PHYSICAL EXAM BP 124/82  Pulse 75  Resp 16  Ht 5' 1.25" (1.556 m)  Wt 152 lb 12.8 oz (69.31 kg)  BMI 28.63 kg/m2  General: Alert, oriented x3, no distress Head: no evidence of trauma, PERRL, EOMI, no exophtalmos or lid lag, no myxedema, no xanthelasma; normal ears, nose and oropharynx Neck: normal jugular venous pulsations and no hepatojugular reflux; brisk carotid pulses without delay and no carotid bruits Chest: clear to auscultation, no signs of consolidation by percussion or palpation, normal fremitus, symmetrical and full respiratory excursions Cardiovascular: normal position and quality of the apical impulse, irregular rhythm, normal first and loud second heart sounds, no murmurs, rubs or gallops Abdomen: no tenderness or distention, no masses by palpation, no abnormal pulsatility or arterial bruits, normal bowel sounds, no hepatosplenomegaly Extremities: no clubbing, cyanosis; 1+ ankle bilateral edema; 2+ radial, ulnar and brachial pulses bilaterally; 2+ right femoral, posterior tibial and dorsalis pedis pulses; 2+ left femoral, posterior tibial and dorsalis pedis pulses; no subclavian or femoral bruits Neurological: grossly nonfocal   EKG: Atrial fibrillation with slow ventricular response otherwise normal tracing  Lipid Panel  Cholesterol 140, triglycerides 51, HDL 65, LDL 65 (October 2013)  BMET October 2013 creatinine 1.1 sodium 141 potassium 3.9 normal liver function tests glucose 103 normal thyroid function tests    Component Value Date/Time   NA 142 06/14/2010 0410   K 3.6 06/14/2010 0410   CL 108 06/14/2010 0410   CO2 29 06/14/2010 0410   GLUCOSE 97 06/14/2010 0410   BUN 10 06/14/2010 0410   CREATININE 0.78 06/14/2010 0410   CALCIUM 8.9 06/14/2010 0410   GFRNONAA >60 06/14/2010 0410   GFRAA  Value: >60        The eGFR has been calculated using the MDRD  equation. This calculation has not been validated in all clinical situations. eGFR's persistently <60 mL/min signify possible Chronic Kidney Disease. 06/14/2010 0410     ASSESSMENT AND PLAN PULMONARY HYPERTENSION, SECONDARY She has mild to moderate pulmonary hypertension of uncertain etiology, possibly related to restrictive lung disease secondary to kyphosis. This does not appear to be a progressive problem. There was no evidence of heart failure by right heart catheterization. Degree of dyspnea is unchanged over time  ATRIAL FIBRILLATION Permanent atrial fibrillation on chronic warfarin anticoagulation and beta blockers and digoxin for rate control. Her heart rate is actually excessively slow today and I worry about the potential toxic effects of digoxin in this octogenarian: have asked her to discontinue digoxin  Vertigo This may represent benign positional vertigo but the very abrupt onset of her symptoms and the absence of nausea  and nystagmus would be a little atypical. She may have some mild ataxia . She has permanent atrial fibrillation and is therefore at risk of stroke. I recommended that she have a head CT to exclude an embolic event. I have given her a prescription for meclizine to use as needed. If this doesn't help the problem and the CT is negative , she should see an ear nose and throat specialist.  Orders Placed This Encounter  Procedures  . CT Head Wo Contrast  . EKG 12-Lead   Meds ordered this encounter  Medications  . metoprolol (LOPRESSOR) 100 MG tablet    Sig: Take 50 mg by mouth 2 (two) times daily.  Marland Kitchen warfarin (COUMADIN) 4 MG tablet    Sig: Take 2-4 mg by mouth as directed.  . Calcium Carb-Cholecalciferol (CALCIUM + D3) 600-200 MG-UNIT TABS    Sig: Take 1 tablet by mouth 2 (two) times daily.  Marland Kitchen DISCONTD: meclizine (ANTIVERT) 32 MG tablet    Sig: Take 1 tablet (32 mg total) by mouth 3 (three) times daily as needed for dizziness.    Dispense:  30 tablet    Refill:   0    Arionna Hoggard  Thurmon Fair, MD, [facility] [phone] office [phone] pager

## 2013-03-18 NOTE — Assessment & Plan Note (Addendum)
This may represent benign positional vertigo but the very abrupt onset of her symptoms and the absence of nausea and nystagmus would be a little atypical. She may have some mild ataxia . She has permanent atrial fibrillation and is therefore at risk of stroke. I recommended that she have a head CT to exclude an embolic event. I have given her a prescription for meclizine to use as needed. If this doesn't help the problem and the CT is negative , she should see an ear nose and throat specialist.

## 2013-03-18 NOTE — Assessment & Plan Note (Signed)
Permanent atrial fibrillation on chronic warfarin anticoagulation and beta blockers and digoxin for rate control. Her heart rate is actually excessively slow today and I worry about the potential toxic effects of digoxin in this octogenarian: have asked her to discontinue digoxin

## 2013-03-18 NOTE — Telephone Encounter (Signed)
Was her yesterday-had an ear infection-Dr C said he was going to call her a prescription in-the medicine he prescribed the pharmacist said it did not come in that milligram-still have not gotten her mediiine.She says she really needs this asap.

## 2013-03-18 NOTE — Telephone Encounter (Signed)
New Rx sent in for Meclizine 25mg   TID PRN w/no refills.

## 2013-03-18 NOTE — Telephone Encounter (Signed)
Call to pharmacy and informed Meclizine comes in 12.5 mg or 25 mg.  Will need to resend Rx.    Message forwarded to Dr. Royann Shivers.

## 2013-03-22 ENCOUNTER — Ambulatory Visit
Admission: RE | Admit: 2013-03-22 | Discharge: 2013-03-22 | Disposition: A | Discharge status: Home / Self Care | Payer: Medicare HMO | Source: Ambulatory Visit | Attending: Cardiovascular Disease | Admitting: Cardiovascular Disease

## 2013-03-22 DIAGNOSIS — R42 Dizziness and giddiness: Secondary | ICD-10-CM

## 2013-05-14 ENCOUNTER — Ambulatory Visit (INDEPENDENT_AMBULATORY_CARE_PROVIDER_SITE_OTHER): Payer: Medicare HMO | Admitting: Cardiovascular Disease

## 2013-05-14 VITALS — BP 136/86 | Resp 16 | Ht 64.0 in | Wt 149.0 lb

## 2013-05-14 DIAGNOSIS — I2789 Other specified pulmonary heart diseases: Secondary | ICD-10-CM

## 2013-05-14 DIAGNOSIS — R413 Other amnesia: Secondary | ICD-10-CM

## 2013-05-14 DIAGNOSIS — I4891 Unspecified atrial fibrillation: Secondary | ICD-10-CM

## 2013-05-14 NOTE — Patient Instructions (Addendum)
Your physician recommends that you schedule a follow-up appointment in: 6 months  

## 2013-05-15 ENCOUNTER — Encounter: Payer: Self-pay | Admitting: Cardiovascular Disease

## 2013-05-15 DIAGNOSIS — R413 Other amnesia: Secondary | ICD-10-CM | POA: Insufficient documentation

## 2013-05-15 NOTE — Assessment & Plan Note (Signed)
Barring any bleeding complications she should remain on lifelong warfarin anticoagulation. Ventricular rate is well controlled.

## 2013-05-15 NOTE — Assessment & Plan Note (Signed)
Stable, mild to moderate pulmonary hypertension may be secondary to restrictive lung disease secondary to kyphosis. Continue observation. Vasodilator therapy is unlikely to be a beneficial intervention.

## 2013-05-15 NOTE — Assessment & Plan Note (Signed)
She has noticed short-term memory lapses recently. She believes this might be related to her vertigo, but I told her that this is unlikely. I am unable to discern whether she truly has some issues with dementia or simple absentmindedness by our interview today. I have encouraged her to discuss a Mini-Mental Status Examination when she next needs with her primary care physician, Dr. Juleen China.

## 2013-05-15 NOTE — Progress Notes (Signed)
Patient ID: Wendy Flowers, female   DOB: 07/24/1927, 79 y.o.   MRN: [id number]     Reason for office visit Pulmonary hypertension, permanent atrial fibrillation  Wendy Flowers has no change in her clinical status compared with her visit in October last year. She continues to have mild to moderate shortness of breath on exertion (functional class II) and mild to moderate lower shunting edema. She remains in atrial fibrillation which appears to be a permanent arrhythmia and has not had any bleeding complications while on warfarin anticoagulation. She has not had a stroke or TIA. She continues to have problems with ankle swelling but it is no different than the past. She has not had any sores on her legs or episodes of cellulitis.  She has mild to moderate pulmonary hypertension confirmed by right heart catheterization (PA pressure 49/20, mean 30, PA wedge pressure mean 16 mm Hg), but this appears to be stable in severity and is not clearly explained. The most likely cause is restrictive lung disease from fairly significant thoracic kyphoscoliosis in turn related to osteoporosis. She does not have a history of smoking does not have signs or symptoms of obstructive sleep apnea and has never had known venous thromboembolic events. A summary workup for inflammatory/autoimmune disorders in the past has been unrevealing. Her forced vital capacity is roughly 62% of predicted and does not change with bronchodilator therapy.    Allergies  Allergen Reactions  . Foradil [Formoterol]     Current Outpatient Prescriptions  Medication Sig Dispense Refill  . Calcium Carb-Cholecalciferol (CALCIUM + D3) 600-200 MG-UNIT TABS Take 1 tablet by mouth 2 (two) times daily.      . furosemide (LASIX) 40 MG tablet Take 40 mg by mouth.      . levothyroxine (SYNTHROID, LEVOTHROID) 125 MCG tablet Take 125 mcg by mouth daily before breakfast.      . metoprolol (LOPRESSOR) 100 MG tablet Take 50 mg by mouth 2 (two) times daily.      .  simvastatin (ZOCOR) 40 MG tablet Take 40 mg by mouth every evening.      . warfarin (COUMADIN) 4 MG tablet Take 2-4 mg by mouth as directed.      . meclizine (ANTIVERT) 25 MG tablet Take 1 tablet (25 mg total) by mouth 3 (three) times daily as needed.  30 tablet  0   No current facility-administered medications for this visit.    Past Medical History  Diagnosis Date  . Permanent atrial fibrillation   . Pulmonary hypertension   . Lower extremity edema     Past Surgical History  Procedure Laterality Date  . Oophorectomy  1961    left  . Breast lumpectomy  1073    left  . Cataract extraction  2008    bilateral  . Cardiac catheterization  06/13/2010    R & LHC: no CAD  . US echocardiography  02/04/2012    LA mod. dilated,RA mild-mod dilated,mild MR,mod. TR    Family History  Problem Relation Age of Onset  . Heart attack Mother   . Heart attack Father   . Diabetes Brother   . Cancer Brother     History   Social History  . Marital Status: Widowed    Spouse Name: N/A    Number of Children: N/A  . Years of Education: N/A   Occupational History  . Not on file.   Social History Main Topics  . Smoking status: Never Smoker   . Smokeless tobacco: Never  Used  . Alcohol Use: Yes     Comment: seldom; 03/17/2013-occasionally drinks a glass of wine before bed  . Drug Use: No  . Sexual Activity: Not on file   Other Topics Concern  . Not on file   Social History Narrative  . No narrative on file    Review of systems: The patient specifically denies any chest pain at rest or with exertion, dyspnea at rest, orthopnea, paroxysmal nocturnal dyspnea, syncope, palpitations, focal neurological deficits, intermittent claudication,unexplained weight gain, cough, hemoptysis or wheezing.  The patient also denies abdominal pain, nausea, vomiting, dysphagia, diarrhea, constipation, polyuria, polydipsia, dysuria, hematuria, frequency, urgency, abnormal bleeding or bruising, fever, chills,  unexpected weight changes, mood swings, change in skin or hair texture, change in voice quality, auditory or visual problems, allergic reactions or rashes, new musculoskeletal complaints other than usual "aches and pains".   PHYSICAL EXAM BP 136/86  Resp 16  Ht 5\' 4"  (1.626 m)  Wt 149 lb (67.586 kg)  BMI 25.56 kg/m2  General: Alert, oriented x3, no distress Head: no evidence of trauma, PERRL, EOMI, no exophtalmos or lid lag, no myxedema, no xanthelasma; normal ears, nose and oropharynx Neck: normal jugular venous pulsations and no hepatojugular reflux; brisk carotid pulses without delay and no carotid bruits Chest: clear to auscultation, no signs of consolidation by percussion or palpation, normal fremitus, symmetrical and full respiratory excursions; she has moderate to severe thoracic kyphoscoliosis Cardiovascular: normal position and quality of the apical impulse, regular rhythm, normal first and second heart sounds, no  rubs or gallops, grade 2/6 holosystolic murmur at the left lower sternal border Abdomen: no tenderness or distention, no masses by palpation, no abnormal pulsatility or arterial bruits, normal bowel sounds, no hepatosplenomegaly Extremities: no clubbing, cyanosis or edema; 2+ radial, ulnar and brachial pulses bilaterally; 2+ right femoral, posterior tibial and dorsalis pedis pulses; 2+ left femoral, posterior tibial and dorsalis pedis pulses; no subclavian or femoral bruits Neurological: grossly nonfocal   EKG: Atrial fibrillation otherwise unremarkable  Lipid Panel  No results found for this basename: chol, trig, hdl, cholhdl, vldl, ldlcalc    BMET    Component Value Date/Time   NA 142 06/14/2010 0410   K 3.6 06/14/2010 0410   CL 108 06/14/2010 0410   CO2 29 06/14/2010 0410   GLUCOSE 97 06/14/2010 0410   BUN 10 06/14/2010 0410   CREATININE 0.78 06/14/2010 0410   CALCIUM 8.9 06/14/2010 0410   GFRNONAA >60 06/14/2010 0410   GFRAA  Value: >60        The eGFR has been  calculated using the MDRD equation. This calculation has not been validated in all clinical situations. eGFR's persistently <60 mL/min signify possible Chronic Kidney Disease. 06/14/2010 0410     ASSESSMENT AND PLAN PULMONARY HYPERTENSION, SECONDARY Stable, mild to moderate pulmonary hypertension may be secondary to restrictive lung disease secondary to kyphosis. Continue observation. Vasodilator therapy is unlikely to be a beneficial intervention.  ATRIAL FIBRILLATION Barring any bleeding complications she should remain on lifelong warfarin anticoagulation. Ventricular rate is well controlled.    Junious Silk, MD, [facility] [phone] office [phone] pager

## 2013-05-17 ENCOUNTER — Encounter: Payer: Self-pay | Admitting: Cardiovascular Disease

## 2013-11-09 ENCOUNTER — Telehealth: Payer: Self-pay | Admitting: Cardiovascular Disease

## 2013-11-25 ENCOUNTER — Ambulatory Visit: Payer: Medicare HMO | Admitting: Cardiovascular Disease

## 2013-12-02 NOTE — Telephone Encounter (Signed)
Closed encounter °

## 2013-12-20 ENCOUNTER — Ambulatory Visit (INDEPENDENT_AMBULATORY_CARE_PROVIDER_SITE_OTHER): Payer: Medicare HMO | Admitting: Cardiovascular Disease

## 2013-12-20 ENCOUNTER — Encounter: Payer: Self-pay | Admitting: Cardiovascular Disease

## 2013-12-20 VITALS — BP 124/88 | HR 97 | Resp 20 | Ht 62.0 in | Wt 154.2 lb

## 2013-12-20 DIAGNOSIS — I2789 Other specified pulmonary heart diseases: Secondary | ICD-10-CM

## 2013-12-20 DIAGNOSIS — I4891 Unspecified atrial fibrillation: Secondary | ICD-10-CM

## 2013-12-20 NOTE — Assessment & Plan Note (Signed)
Ventricular rate control is borderline at almost 100 beats per minute. She'll call us after she gets home to tell us whether she is taking the tartrate or the succinate. May have to slightly increase the dose. She is appropriately anticoagulated

## 2013-12-20 NOTE — Assessment & Plan Note (Signed)
Suspect this is related to restrictive lung disease. 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 She has no history of acute pericarditis, chest trauma or surgery or other potential etiologies. Even at this diagnosis was confirmed, she would not be a good candidate for surgery. Continue observation.

## 2013-12-20 NOTE — Progress Notes (Signed)
Patient ID: Wendy Flowers, female   DOB: 1926/12/26, 80 y.o.   MRN: [id number]     Reason for office visit Atrial fibrillation, pulmonary hypertension  Mrs. Behler has not had any new health challenges since her last appointment in October 2014.She continues to have mild to moderate shortness of breath on exertion (functional class II) and mild to moderate lower extremity edema. She remains in permanent atrial fibrillation and has not had any bleeding complications while on warfarin anticoagulation. She has not had a stroke or TIA. She continues to have problems with ankle swelling but it is no different than the past. She has not had any sores on her legs or episodes of cellulitis.  She has mild to moderate pulmonary hypertension confirmed by right heart catheterization (PA pressure 49/20, mean 30, PA wedge pressure mean 16 mm Hg), but this appears to be stable in severity and is not clearly explained. The most likely cause is restrictive lung disease from fairly significant thoracic kyphoscoliosis in turn related to osteoporosis. She does not have a history of smoking does not have signs or symptoms of obstructive sleep apnea and has never had known venous thromboembolic events. A summary workup for inflammatory/autoimmune disorders in the past has been unrevealing. Her forced vital capacity is roughly 62% of predicted and does not change with bronchodilator therapy. For the first time today I noticed very prominent jugular venous distention with overt Kussmaul sign. Previous studies have not really suggested a diagnosis of constrictive pericarditis, although the ability to make this diagnosis is hampered by her irregular rhythm. No mention is made of a pericardial abnormalities on the CT of the chest from 2011. As a little confusion as to whether her current med metoprolol prescription is for the tartrate or for the succinate formulation. The pharmacy records suggest that she has recently picked up the  metoprolol succinate 50 mg once daily. She will check her bottle when she gets home and call us.    Allergies  Allergen Reactions  . Foradil [Formoterol]     Current Outpatient Prescriptions  Medication Sig Dispense Refill  . Calcium Carb-Cholecalciferol (CALCIUM + D3) 600-200 MG-UNIT TABS Take 1 tablet by mouth 2 (two) times daily.      . furosemide (LASIX) 40 MG tablet Take 40 mg by mouth.      . levothyroxine (SYNTHROID, LEVOTHROID) 125 MCG tablet Take 125 mcg by mouth daily before breakfast.      . PROLIA 60 MG/ML SOLN injection Inject 60 mg into the skin every 6 (six) months.      . simvastatin (ZOCOR) 40 MG tablet Take 40 mg by mouth every evening.      . warfarin (COUMADIN) 4 MG tablet Take 2-4 mg by mouth as directed.      . metoprolol (LOPRESSOR) 100 MG tablet Take 50 mg by mouth 2 (two) times daily.      . metoprolol succinate (TOPROL-XL) 50 MG 24 hr tablet        No current facility-administered medications for this visit.    Past Medical History  Diagnosis Date  . Permanent atrial fibrillation   . Pulmonary hypertension   . Lower extremity edema     Past Surgical History  Procedure Laterality Date  . Oophorectomy  1961    left  . Breast lumpectomy  1073    left  . Cataract extraction  2008    bilateral  . Cardiac catheterization  06/13/2010    R & LHC: no CAD  .  US echocardiography  02/04/2012    LA mod. dilated,RA mild-mod dilated,mild MR,mod. TR    Family History  Problem Relation Age of Onset  . Heart attack Mother   . Heart attack Father   . Diabetes Brother   . Cancer Brother     History   Social History  . Marital Status: Widowed    Spouse Name: N/A    Number of Children: N/A  . Years of Education: N/A   Occupational History  . Not on file.   Social History Main Topics  . Smoking status: Never Smoker   . Smokeless tobacco: Never Used  . Alcohol Use: Yes     Comment: seldom; 03/17/2013-occasionally drinks a glass of wine before bed  .  Drug Use: No  . Sexual Activity: Not on file   Other Topics Concern  . Not on file   Social History Narrative  . No narrative on file    Review of systems: The patient specifically denies any chest pain at rest or with exertion, dyspnea at rest, orthopnea, paroxysmal nocturnal dyspnea, syncope, palpitations, focal neurological deficits, intermittent claudication,unexplained weight gain, cough, hemoptysis or wheezing.  The patient also denies abdominal pain, nausea, vomiting, dysphagia, diarrhea, constipation, polyuria, polydipsia, dysuria, hematuria, frequency, urgency, abnormal bleeding or bruising, fever, chills, unexpected weight changes, mood swings, change in skin or hair texture, change in voice quality, auditory or visual problems, allergic reactions or rashes, new musculoskeletal complaints other than usual "aches and pains".   PHYSICAL EXAM BP 124/88  Pulse 97  Resp 20  Ht $R'5\' 2"'ha$  (1.575 m)  Wt 154 lb 3.2 oz (69.945 kg)  BMI 28.20 kg/m2 General: Alert, oriented x3, no distress  Head: no evidence of trauma, PERRL, EOMI, no exophtalmos or lid lag, no myxedema, no xanthelasma; normal ears, nose and oropharynx  Neck: Marked Kussmaul's sign, with flat jugular veins during expiration and 6-7 cm elevation in jugular venous pulsations during inspiration and no hepatojugular reflux; brisk carotid pulses without delay and no carotid bruits  Chest: clear to auscultation, no signs of consolidation by percussion or palpation, normal fremitus, symmetrical and full respiratory excursions; she has moderate to severe thoracic kyphoscoliosis  Cardiovascular: normal position and quality of the apical impulse, irregular rhythm, normal first and second heart sounds, no rubs or gallops, grade 2/6 holosystolic murmur at the left lower sternal border  Abdomen: no tenderness or distention, no masses by palpation, no abnormal pulsatility or arterial bruits, normal bowel sounds, no hepatosplenomegaly    Extremities: no clubbing, cyanosis or edema; 2+ radial, ulnar and brachial pulses bilaterally; 2+ right femoral, posterior tibial and dorsalis pedis pulses; 2+ left femoral, posterior tibial and dorsalis pedis pulses; no subclavian or femoral bruits  Neurological: grossly nonfocal  EKG: Atrial fibrillation otherwise unremarkable   BMET    Component Value Date/Time   NA 142 06/14/2010 0410   K 3.6 06/14/2010 0410   CL 108 06/14/2010 0410   CO2 29 06/14/2010 0410   GLUCOSE 97 06/14/2010 0410   BUN 10 06/14/2010 0410   CREATININE 0.78 06/14/2010 0410   CALCIUM 8.9 06/14/2010 0410   GFRNONAA >60 06/14/2010 0410   GFRAA  Value: >60        The eGFR has been calculated using the MDRD equation. This calculation has not been validated in all clinical situations. eGFR's persistently <60 mL/min signify possible Chronic Kidney Disease. 06/14/2010 0410     ASSESSMENT AND PLAN ATRIAL FIBRILLATION Ventricular rate control is borderline at almost 100 beats per  minute. She'll call us after she gets home to tell us whether she is taking the tartrate or the succinate. May have to slightly increase the dose. She is appropriately anticoagulated  PULMONARY HYPERTENSION, SECONDARY Suspect this is related to restrictive lung disease. 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 She has no history of acute pericarditis, chest trauma or surgery or other potential etiologies. Even at this diagnosis was confirmed, she would not be a good candidate for surgery. Continue observation.   Orders Placed This Encounter  Procedures  . EKG 12-Lead   Meds ordered this encounter  Medications  . PROLIA 60 MG/ML SOLN injection    Sig: Inject 60 mg into the skin every 6 (six) months.  . metoprolol succinate (TOPROL-XL) 50 MG 24 hr tablet    Sig:     Elaysia Devargas  Sanda Klein, MD, [facility] [phone] office [phone] pager

## 2013-12-20 NOTE — Patient Instructions (Signed)
Your physician wants you to follow-up in: 1 year. You will receive a reminder letter in the mail two months in advance. If you don't receive a letter, please call our office to schedule the follow-up appointment.  ** please call our office to clarify which metoprolol you take (metoprolol tartrate or metoprolol succinate).

## 2013-12-22 ENCOUNTER — Telehealth: Payer: Self-pay | Admitting: Cardiovascular Disease

## 2013-12-22 NOTE — Telephone Encounter (Signed)
Good. Continue same medication.

## 2013-12-22 NOTE — Telephone Encounter (Signed)
RN called patient. Patient is aware to continue with current medication

## 2013-12-22 NOTE — Telephone Encounter (Signed)
She was suppose to let Dr C know which Metoprolol she was taking. She takes Metoprolol Succ-ER.

## 2013-12-22 NOTE — Telephone Encounter (Signed)
RN spoke to patient. RN confirmed with Wendy Flowers. She states she has been taking METOPROLOL SUCC 50 mg (taking 1/2 of 50 mg tablet in the morning and 1/2 of 50 mg tablet in the evening.) RN corrected patient 's medication list.Discontinue lopressor on her list  Defer to Dr Sallyanne Kuster, if he wants to Register inform RN.

## 2014-02-05 ENCOUNTER — Inpatient Hospital Stay (HOSPITAL_COMMUNITY): Payer: Medicare HMO

## 2014-02-05 ENCOUNTER — Encounter (HOSPITAL_COMMUNITY): Payer: Self-pay | Admitting: Emergency Medicine

## 2014-02-05 ENCOUNTER — Inpatient Hospital Stay (HOSPITAL_COMMUNITY)
Admission: EM | Admit: 2014-02-05 | Discharge: 2014-02-06 | DRG: 194 | Disposition: A | Discharge status: Home / Self Care | Payer: Medicare HMO | Attending: Internal Medicine | Admitting: Internal Medicine

## 2014-02-05 ENCOUNTER — Emergency Department (HOSPITAL_COMMUNITY): Payer: Medicare HMO

## 2014-02-05 DIAGNOSIS — Z79899 Other long term (current) drug therapy: Secondary | ICD-10-CM

## 2014-02-05 DIAGNOSIS — I311 Chronic constrictive pericarditis: Secondary | ICD-10-CM | POA: Diagnosis present

## 2014-02-05 DIAGNOSIS — Z7901 Long term (current) use of anticoagulants: Secondary | ICD-10-CM

## 2014-02-05 DIAGNOSIS — I428 Other cardiomyopathies: Secondary | ICD-10-CM | POA: Diagnosis present

## 2014-02-05 DIAGNOSIS — I4891 Unspecified atrial fibrillation: Secondary | ICD-10-CM

## 2014-02-05 DIAGNOSIS — R0989 Other specified symptoms and signs involving the circulatory and respiratory systems: Secondary | ICD-10-CM

## 2014-02-05 DIAGNOSIS — R42 Dizziness and giddiness: Secondary | ICD-10-CM

## 2014-02-05 DIAGNOSIS — I059 Rheumatic mitral valve disease, unspecified: Secondary | ICD-10-CM | POA: Diagnosis present

## 2014-02-05 DIAGNOSIS — I2789 Other specified pulmonary heart diseases: Secondary | ICD-10-CM

## 2014-02-05 DIAGNOSIS — I482 Chronic atrial fibrillation, unspecified: Secondary | ICD-10-CM

## 2014-02-05 DIAGNOSIS — I4821 Permanent atrial fibrillation: Secondary | ICD-10-CM | POA: Diagnosis present

## 2014-02-05 DIAGNOSIS — R413 Other amnesia: Secondary | ICD-10-CM

## 2014-02-05 DIAGNOSIS — I10 Essential (primary) hypertension: Secondary | ICD-10-CM

## 2014-02-05 DIAGNOSIS — H8110 Benign paroxysmal vertigo, unspecified ear: Secondary | ICD-10-CM | POA: Diagnosis present

## 2014-02-05 DIAGNOSIS — J309 Allergic rhinitis, unspecified: Secondary | ICD-10-CM

## 2014-02-05 DIAGNOSIS — R0609 Other forms of dyspnea: Secondary | ICD-10-CM

## 2014-02-05 DIAGNOSIS — I5023 Acute on chronic systolic (congestive) heart failure: Secondary | ICD-10-CM

## 2014-02-05 DIAGNOSIS — I509 Heart failure, unspecified: Secondary | ICD-10-CM

## 2014-02-05 DIAGNOSIS — J189 Pneumonia, unspecified organism: Principal | ICD-10-CM

## 2014-02-05 LAB — COMPREHENSIVE METABOLIC PANEL
ALBUMIN: 3.6 g/dL (ref 3.5–5.2)
ALK PHOS: 61 U/L (ref 39–117)
ALT: 15 U/L (ref 0–35)
AST: 20 U/L (ref 0–37)
BUN: 18 mg/dL (ref 6–23)
CO2: 27 mEq/L (ref 19–32)
Calcium: 9.1 mg/dL (ref 8.4–10.5)
Chloride: 104 mEq/L (ref 96–112)
Creatinine, Ser: 0.79 mg/dL (ref 0.50–1.10)
GFR calc Af Amer: 86 mL/min — ABNORMAL LOW (ref >90)
GFR calc non Af Amer: 74 mL/min — ABNORMAL LOW (ref >90)
GLUCOSE: 114 mg/dL — AB (ref 70–99)
POTASSIUM: 3.8 meq/L (ref 3.7–5.3)
SODIUM: 144 meq/L (ref 137–147)
TOTAL PROTEIN: 6.9 g/dL (ref 6.0–8.3)
Total Bilirubin: 0.7 mg/dL (ref 0.3–1.2)

## 2014-02-05 LAB — CBC WITH DIFFERENTIAL/PLATELET
BASOS PCT: 1 % (ref 0–1)
Basophils Absolute: 0 10*3/uL (ref 0.0–0.1)
EOS ABS: 0.1 10*3/uL (ref 0.0–0.7)
Eosinophils Relative: 1 % (ref 0–5)
HCT: 43.2 % (ref 36.0–46.0)
Hemoglobin: 14.1 g/dL (ref 12.0–15.0)
LYMPHS ABS: 1.1 10*3/uL (ref 0.7–4.0)
Lymphocytes Relative: 25 % (ref 12–46)
MCH: 29.6 pg (ref 26.0–34.0)
MCHC: 32.6 g/dL (ref 30.0–36.0)
MCV: 90.6 fL (ref 78.0–100.0)
Monocytes Absolute: 0.5 10*3/uL (ref 0.1–1.0)
Monocytes Relative: 11 % (ref 3–12)
NEUTROS ABS: 2.8 10*3/uL (ref 1.7–7.7)
NEUTROS PCT: 62 % (ref 43–77)
PLATELETS: 196 10*3/uL (ref 150–400)
RBC: 4.77 MIL/uL (ref 3.87–5.11)
RDW: 14.5 % (ref 11.5–15.5)
WBC: 4.4 10*3/uL (ref 4.0–10.5)

## 2014-02-05 LAB — TROPONIN I

## 2014-02-05 LAB — PROTIME-INR
INR: 2.28 — ABNORMAL HIGH (ref 0.00–1.49)
Prothrombin Time: 25.1 seconds — ABNORMAL HIGH (ref 11.6–15.2)

## 2014-02-05 LAB — URINALYSIS, ROUTINE W REFLEX MICROSCOPIC
BILIRUBIN URINE: NEGATIVE
GLUCOSE, UA: NEGATIVE mg/dL
Hgb urine dipstick: NEGATIVE
KETONES UR: NEGATIVE mg/dL
Leukocytes, UA: NEGATIVE
Nitrite: NEGATIVE
PH: 7 (ref 5.0–8.0)
Protein, ur: NEGATIVE mg/dL
Specific Gravity, Urine: 1.016 (ref 1.005–1.030)
Urobilinogen, UA: 1 mg/dL (ref 0.0–1.0)

## 2014-02-05 LAB — I-STAT TROPONIN, ED: Troponin i, poc: 0 ng/mL (ref 0.00–0.08)

## 2014-02-05 LAB — PRO B NATRIURETIC PEPTIDE: Pro B Natriuretic peptide (BNP): 899.9 pg/mL — ABNORMAL HIGH (ref 0–450)

## 2014-02-05 LAB — TSH: TSH: 0.897 u[IU]/mL (ref 0.350–4.500)

## 2014-02-05 MED ORDER — DEXTROSE 5 % IV SOLN
500.0000 mg | INTRAVENOUS | Status: DC
  Filled 2014-02-05: qty 500

## 2014-02-05 MED ORDER — WARFARIN SODIUM 2 MG PO TABS
2.0000 mg | ORAL_TABLET | ORAL | Status: DC
  Administered 2014-02-05: 2 mg via ORAL
  Filled 2014-02-05 (×2): qty 1

## 2014-02-05 MED ORDER — CALCIUM CARBONATE-VITAMIN D 500-200 MG-UNIT PO TABS
1.0000 | ORAL_TABLET | Freq: Two times a day (BID) | ORAL | Status: DC
  Administered 2014-02-05 – 2014-02-06 (×2): 1 via ORAL
  Filled 2014-02-05 (×3): qty 1

## 2014-02-05 MED ORDER — ACETAMINOPHEN 325 MG PO TABS: 650.0000 mg | ORAL_TABLET | Freq: Four times a day (QID) | ORAL | Status: DC | PRN

## 2014-02-05 MED ORDER — SODIUM CHLORIDE 0.9 % IJ SOLN
3.0000 mL | Freq: Two times a day (BID) | INTRAMUSCULAR | Status: DC
  Administered 2014-02-05: 3 mL via INTRAVENOUS
  Administered 2014-02-06: 10:00:00 via INTRAVENOUS

## 2014-02-05 MED ORDER — CEFTRIAXONE SODIUM 1 G IJ SOLR: 1.0000 g | Freq: Once | INTRAMUSCULAR | Status: DC

## 2014-02-05 MED ORDER — WARFARIN SODIUM 4 MG PO TABS: 4.0000 mg | ORAL_TABLET | ORAL | Status: DC

## 2014-02-05 MED ORDER — ACETAMINOPHEN 650 MG RE SUPP: 650.0000 mg | Freq: Four times a day (QID) | RECTAL | Status: DC | PRN

## 2014-02-05 MED ORDER — FUROSEMIDE 40 MG PO TABS
40.0000 mg | ORAL_TABLET | Freq: Every day | ORAL | Status: DC
  Administered 2014-02-06: 40 mg via ORAL
  Filled 2014-02-05 (×2): qty 1

## 2014-02-05 MED ORDER — WARFARIN - PHARMACIST DOSING INPATIENT: Freq: Every day | Status: DC

## 2014-02-05 MED ORDER — ONDANSETRON HCL 4 MG/2ML IJ SOLN: 4.0000 mg | Freq: Four times a day (QID) | INTRAMUSCULAR | Status: DC | PRN

## 2014-02-05 MED ORDER — SIMVASTATIN 40 MG PO TABS
40.0000 mg | ORAL_TABLET | Freq: Every evening | ORAL | Status: DC
  Filled 2014-02-05 (×2): qty 1

## 2014-02-05 MED ORDER — CEFTRIAXONE SODIUM 1 G IJ SOLR
1.0000 g | INTRAMUSCULAR | Status: DC
  Filled 2014-02-05: qty 10

## 2014-02-05 MED ORDER — LEVOTHYROXINE SODIUM 125 MCG PO TABS
125.0000 ug | ORAL_TABLET | Freq: Every day | ORAL | Status: DC
  Administered 2014-02-06: 125 ug via ORAL
  Filled 2014-02-05 (×2): qty 1

## 2014-02-05 MED ORDER — METOPROLOL SUCCINATE ER 50 MG PO TB24
50.0000 mg | ORAL_TABLET | Freq: Every day | ORAL | Status: DC
  Administered 2014-02-06: 50 mg via ORAL
  Filled 2014-02-05 (×2): qty 1

## 2014-02-05 MED ORDER — AZITHROMYCIN 250 MG PO TABS: 500.0000 mg | ORAL_TABLET | Freq: Once | ORAL | Status: DC

## 2014-02-05 MED ORDER — ONDANSETRON HCL 4 MG PO TABS: 4.0000 mg | ORAL_TABLET | Freq: Four times a day (QID) | ORAL | Status: DC | PRN

## 2014-02-05 NOTE — ED Notes (Signed)
Pt has returned from being out of the department; pt placed back on monitor, continuous pulse oximetry and blood pressure cuff 

## 2014-02-05 NOTE — ED Notes (Signed)
Pt is aware of need of urine specimen; does not have to urinate at this time; family at bedside

## 2014-02-05 NOTE — ED Notes (Signed)
Patient did not have LOC however she complained of dizziness and lightheadedness.  She denies pain, N/V or any other symptoms.  She advised EMS she was driving to [place] to get something to eat and she started feeling lightheadedness and dizzy so she pulled over.  The patient called EMS herself.  The patient does have a #18 to the left AC no meds given.  The patient was transported here to be evaluated.

## 2014-02-05 NOTE — ED Provider Notes (Signed)
CSN: [id number]     Arrival date & time 02/05/14  1125 History   First MD Initiated Contact with Patient 02/05/14 1132     Chief Complaint  Patient presents with  . Dizziness    Patient did not have LOC however she complained of dizziness and lightheadedness.  She denies pain, N/V or any other symptoms.     (Consider location/radiation/quality/duration/timing/severity/associated sxs/prior Treatment) HPI Comments: The patient is an 80 year old female past history of chronic A. fib currently on Coumadin presents emergency room chief complaint of dizziness since today. The patient reports while driving she had an abrupt onset of and vertigo-like symptoms. She reports similar symptoms in the past approximately 2 years ago and was diagnosed with vertigo. She reports lightheadedness for the past 2 days without syncope. The patient reports increase in lower extremity edema for the last several days. She denies headache, numbness, weakness, abdominal pain, nausea, vomiting, shortness breath, chest pain. PCP: Dwan Bolt, MD  The history is provided by the patient. No language interpreter was used.    Past Medical History  Diagnosis Date  . Permanent atrial fibrillation   . Pulmonary hypertension   . Lower extremity edema    Past Surgical History  Procedure Laterality Date  . Oophorectomy  1961    left  . Breast lumpectomy  1073    left  . Cataract extraction  2008    bilateral  . Cardiac catheterization  06/13/2010    R & LHC: no CAD  . US echocardiography  02/04/2012    LA mod. dilated,RA mild-mod dilated,mild MR,mod. TR   Family History  Problem Relation Age of Onset  . Heart attack Mother   . Heart attack Father   . Diabetes Brother   . Cancer Brother    History  Substance Use Topics  . Smoking status: Never Smoker   . Smokeless tobacco: Never Used  . Alcohol Use: Yes     Comment: seldom; 03/17/2013-occasionally drinks a glass of wine before bed   OB History   Grav  Para Term Preterm Abortions TAB SAB Ect Mult Living                 Review of Systems  Constitutional: Negative for fever and chills.  Respiratory: Negative for shortness of breath.   Cardiovascular: Positive for leg swelling. Negative for chest pain.  Gastrointestinal: Negative for nausea, vomiting and abdominal pain.  Neurological: Positive for dizziness and light-headedness. Negative for weakness, numbness and headaches.      Allergies  Foradil  Home Medications   Prior to Admission medications   Medication Sig Start Date End Date Taking? Authorizing Marriah Sanderlin  Calcium Carb-Cholecalciferol (CALCIUM + D3) 600-200 MG-UNIT TABS Take 1 tablet by mouth 2 (two) times daily.    Historical Zenola Dezarn, MD  furosemide (LASIX) 40 MG tablet Take 40 mg by mouth.    Historical Rajendra Spiller, MD  levothyroxine (SYNTHROID, LEVOTHROID) 125 MCG tablet Take 125 mcg by mouth daily before breakfast.    Historical Christia Coaxum, MD  metoprolol succinate (TOPROL-XL) 50 MG 24 hr tablet Take 1/2 tablet in morning and 1/2 tablet in the evening 11/17/13   Historical Laksh Hinners, MD  PROLIA 60 MG/ML SOLN injection Inject 60 mg into the skin every 6 (six) months. 12/08/13   Historical Izaiyah Kleinman, MD  simvastatin (ZOCOR) 40 MG tablet Take 40 mg by mouth every evening.    Historical Keymoni Mccaster, MD  warfarin (COUMADIN) 4 MG tablet Take 2-4 mg by mouth as directed.    Historical  Faith Patricelli, MD   BP 152/95  Pulse 104  Temp(Src) 98 F (36.7 C) (Oral)  Resp 20  SpO2 96% Physical Exam  Nursing note and vitals reviewed. Constitutional: She is oriented to person, place, and time. She appears well-developed and well-nourished.  Non-toxic appearance. She does not have a sickly appearance. She does not appear ill. No distress.  HENT:  Head: Normocephalic and atraumatic.  Eyes: EOM are normal. Pupils are equal, round, and reactive to light. Right eye exhibits no discharge. Left eye exhibits no discharge. No scleral icterus.  Neck: Normal  range of motion. Neck supple.  Cardiovascular: Normal rate and regular rhythm.   No murmur heard. 1+ pitting edema bilaterally, R>L.  Pulmonary/Chest: Effort normal. No respiratory distress. She has no wheezes. She has no rales. She exhibits no tenderness.  Crackles heard at bases L>R.  Abdominal: Soft. Bowel sounds are normal. She exhibits no distension. There is no tenderness. There is no rebound and no guarding.  Musculoskeletal: Normal range of motion.  Moves all 4 extremities.  Neurological: She is alert and oriented to person, place, and time. No cranial nerve deficit. Coordination normal.  Speech is clear and goal oriented, follows commands Cranial nerves III - XII grossly intact, no facial droop Normal strength in upper and lower extremities bilaterally, strong and equal grip strength Sensation normal to light touch Moves all 4 extremities without ataxia, coordination intact Normal finger to nose and rapid alternating movements  Skin: Skin is warm and dry. No rash noted. She is not diaphoretic.  Psychiatric: She has a normal mood and affect. Her behavior is normal. Thought content normal.    ED Course  Procedures (including critical care time) Labs Review Labs Reviewed - No data to display  Results for orders placed during the hospital encounter of 02/05/14  CBC WITH DIFFERENTIAL      Result Value Ref Range   WBC 4.4  4.0 - 10.5 K/uL   RBC 4.77  3.87 - 5.11 MIL/uL   Hemoglobin 14.1  12.0 - 15.0 g/dL   HCT 43.2  36.0 - 46.0 %   MCV 90.6  78.0 - 100.0 fL   MCH 29.6  26.0 - 34.0 pg   MCHC 32.6  30.0 - 36.0 g/dL   RDW 14.5  11.5 - 15.5 %   Platelets 196  150 - 400 K/uL   Neutrophils Relative % 62  43 - 77 %   Neutro Abs 2.8  1.7 - 7.7 K/uL   Lymphocytes Relative 25  12 - 46 %   Lymphs Abs 1.1  0.7 - 4.0 K/uL   Monocytes Relative 11  3 - 12 %   Monocytes Absolute 0.5  0.1 - 1.0 K/uL   Eosinophils Relative 1  0 - 5 %   Eosinophils Absolute 0.1  0.0 - 0.7 K/uL   Basophils  Relative 1  0 - 1 %   Basophils Absolute 0.0  0.0 - 0.1 K/uL  COMPREHENSIVE METABOLIC PANEL      Result Value Ref Range   Sodium 144  137 - 147 mEq/L   Potassium 3.8  3.7 - 5.3 mEq/L   Chloride 104  96 - 112 mEq/L   CO2 27  19 - 32 mEq/L   Glucose, Bld 114 (*) 70 - 99 mg/dL   BUN 18  6 - 23 mg/dL   Creatinine, Ser 0.79  0.50 - 1.10 mg/dL   Calcium 9.1  8.4 - 10.5 mg/dL   Total Protein 6.9  6.0 -  8.3 g/dL   Albumin 3.6  3.5 - 5.2 g/dL   AST 20  0 - 37 U/L   ALT 15  0 - 35 U/L   Alkaline Phosphatase 61  39 - 117 U/L   Total Bilirubin 0.7  0.3 - 1.2 mg/dL   GFR calc non Af Amer 74 (*) >90 mL/min   GFR calc Af Amer 86 (*) >90 mL/min  URINALYSIS, ROUTINE W REFLEX MICROSCOPIC      Result Value Ref Range   Color, Urine YELLOW  YELLOW   APPearance CLEAR  CLEAR   Specific Gravity, Urine 1.016  1.005 - 1.030   pH 7.0  5.0 - 8.0   Glucose, UA NEGATIVE  NEGATIVE mg/dL   Hgb urine dipstick NEGATIVE  NEGATIVE   Bilirubin Urine NEGATIVE  NEGATIVE   Ketones, ur NEGATIVE  NEGATIVE mg/dL   Protein, ur NEGATIVE  NEGATIVE mg/dL   Urobilinogen, UA 1.0  0.0 - 1.0 mg/dL   Nitrite NEGATIVE  NEGATIVE   Leukocytes, UA NEGATIVE  NEGATIVE  PROTIME-INR      Result Value Ref Range   Prothrombin Time 25.1 (*) 11.6 - 15.2 seconds   INR 2.28 (*) 0.00 - 1.49  PRO B NATRIURETIC PEPTIDE      Result Value Ref Range   Pro B Natriuretic peptide (BNP) 899.9 (*) 0 - 450 pg/mL  TROPONIN I      Result Value Ref Range   Troponin I <0.30  <0.30 ng/mL  I-STAT TROPOININ, ED      Result Value Ref Range   Troponin i, poc 0.00  0.00 - 0.08 ng/mL   Comment 3            Dg Chest 2 View  02/05/2014   CLINICAL DATA:  DIZZINESS  EXAM: CHEST - 2 VIEW  COMPARISON:  06/08/2010  FINDINGS: Nodular airspace consolidation in the lateral basal segment left lower lobe. Some increase in central pulmonary vascular congestion and coarse perihilar interstitial markings. Moderate cardiomegaly stable. Suspect small bilateral pleural  effusions. Multiple compression deformities in the mid and lower thoracic and lumbar spine.  IMPRESSION: 1. New left lower lobe focal consolidation, possibly early pneumonia. Recommend followup to confirm appropriate resolution and exclude neoplasm. 2. Small pleural effusions and cardiomegaly.   Electronically Signed   By: Arne Cleveland M.D.   On: 02/05/2014 13:29    Date: 02/05/2014  Rate: 108  Rhythm: atrial fibrillation  QRS Axis: right  Intervals: normal  ST/T Wave abnormalities: normal  Conduction Disutrbances:first-degree A-V block   Narrative Interpretation:   Old EKG Reviewed:       EKG Interpretation None      MDM   Final diagnoses:  Community acquired pneumonia  Atrial fibrillation, unspecified  Vertigo   Patient with history of A. fib complains of vertigo symptoms and lightheadedness for 2 days. Mild pitting edema on exam. Denies chest pain or shortness of breath. Her HR ranged from 100-115. X-ray shows left lower lobe consolidation, possible early pneumonia. And a small pleural effusion. BNP 899, elevated no previous value. UA without signs of infection. CBC unremarkable. CMP without concerning electrolyte abnormalities.  INR  2.28, therapeutic. Plan to admit the patient for CAP and lightheadedness.  Meds given in ED:  Medications  azithromycin (ZITHROMAX) 500 mg in dextrose 5 % 250 mL IVPB (not administered)  cefTRIAXone (ROCEPHIN) 1 g in dextrose 5 % 50 mL IVPB (not administered)    New Prescriptions   No medications on file  Lorrine Kin, PA-C 02/05/14 1627

## 2014-02-05 NOTE — ED Notes (Signed)
Pt wheeled to the bathroom to attempt to provide an urine specimen; family at bedside

## 2014-02-05 NOTE — ED Notes (Signed)
Family at bedside. 

## 2014-02-05 NOTE — H&P (Signed)
[facility] History and Physical  Wendy Flowers WUJ:[id number] DOB: 1926/09/22 DOA: 02/05/2014  Referring physician:  PCP: Dwan Bolt, MD  Specialists:   Chief Complaint: vertigo   HPI: Wendy Flowers is a 80 y.o. female with PMH of HTN,HPL, A fib on AC-coumadin, CHF, pulmonary HTN, ? constrictive pericarditis presented with sudden vertigo while driving;  She reports similar symptoms in the past approximately 2 years ago and was diagnosed with vertigo. She reports lightheadedness for the past 2 days without syncope; she also reported some mild productive cough, with exertional dyspnea; CXR done in ED showed possible pneumonia, and hospitalist called for admission; Pt denies fever, chill,s no chest pain;  No PND, orthopnea, +chronic pedal edema ;    Review of Systems: The patient denies anorexia, fever, weight loss,, vision loss, decreased hearing, hoarseness, chest pain, syncope, dyspnea on exertion, peripheral edema, balance deficits, hemoptysis, abdominal pain, melena, hematochezia, severe indigestion/heartburn, hematuria, incontinence, genital sores, muscle weakness, suspicious skin lesions, transient blindness, difficulty walking, depression, unusual weight change, abnormal bleeding, enlarged lymph nodes, angioedema, and breast masses.    Past Medical History  Diagnosis Date  . Permanent atrial fibrillation   . Pulmonary hypertension   . Lower extremity edema    Past Surgical History  Procedure Laterality Date  . Oophorectomy  1961    left  . Breast lumpectomy  1073    left  . Cataract extraction  2008    bilateral  . Cardiac catheterization  06/13/2010    R & LHC: no CAD  . US echocardiography  02/04/2012    LA mod. dilated,RA mild-mod dilated,mild MR,mod. TR   Social History:  reports that she has never smoked. She has never used smokeless tobacco. She reports that she drinks alcohol. She reports that she does not use illicit drugs. Home;  where does patient  live--home, ALF, SNF? and with whom if at home? Yes;  Can patient participate in ADLs?  Allergies  Allergen Reactions  . Foradil [Formoterol] Other (See Comments)    unknown    Family History  Problem Relation Age of Onset  . Heart attack Mother   . Heart attack Father   . Diabetes Brother   . Cancer Brother     (be sure to complete)  Prior to Admission medications   Medication Sig Start Date End Date Taking? Authorizing [name]  Calcium Carb-Cholecalciferol (CALCIUM + D3) 600-200 MG-UNIT TABS Take 1 tablet by mouth 2 (two) times daily.   Yes Historical Sergio Zawislak, MD  furosemide (LASIX) 40 MG tablet Take 40 mg by mouth.   Yes Historical Havilah Topor, MD  levothyroxine (SYNTHROID, LEVOTHROID) 125 MCG tablet Take 125 mcg by mouth daily before breakfast.   Yes Historical Laymond Postle, MD  metoprolol succinate (TOPROL-XL) 50 MG 24 hr tablet Take 1/2 tablet in morning and 1/2 tablet in the evening 11/17/13  Yes Historical Raysha Tilmon, MD  PROLIA 60 MG/ML SOLN injection Inject 60 mg into the skin every 6 (six) months. 12/08/13  Yes Historical Azariel Banik, MD  simvastatin (ZOCOR) 40 MG tablet Take 40 mg by mouth every evening.   Yes Historical Othal Kubitz, MD  warfarin (COUMADIN) 4 MG tablet Take 2-4 mg by mouth every morning. On Monday and Thursday 4mg .  All other days take 2mg    Yes Historical Maxamilian Amadon, MD   Physical Exam: Filed Vitals:   02/05/14 1312  BP: 146/90  Pulse: 111  Temp:   Resp: 18     General:  alert  Eyes: EOM-I  ENT: no oral  ulcers   Neck: supple, +JVD  Cardiovascular: s1,s2 irregular   Respiratory: BL crackles LL  Abdomen: soft, nt,nd   Skin: no rash   Musculoskeletal: pedal edema  Psychiatric: no hallucinations   Neurologic: CN 2-12 intact, motor 5/5 BL symmetric; +dix haulpike   Labs on Admission:  Basic Metabolic Panel:  Recent Labs Lab 02/05/14 1210  NA 144  K 3.8  CL 104  CO2 27  GLUCOSE 114*  BUN 18  CREATININE 0.79  CALCIUM 9.1   Liver Function  Tests:  Recent Labs Lab 02/05/14 1210  AST 20  ALT 15  ALKPHOS 61  BILITOT 0.7  PROT 6.9  ALBUMIN 3.6   No results found for this basename: LIPASE, AMYLASE,  in the last 168 hours No results found for this basename: AMMONIA,  in the last 168 hours CBC:  Recent Labs Lab 02/05/14 1210  WBC 4.4  NEUTROABS 2.8  HGB 14.1  HCT 43.2  MCV 90.6  PLT 196   Cardiac Enzymes:  Recent Labs Lab 02/05/14 1210  TROPONINI <0.30    BNP (last 3 results)  Recent Labs  02/05/14 1210  PROBNP 899.9*   CBG: No results found for this basename: GLUCAP,  in the last 168 hours  Radiological Exams on Admission: Dg Chest 2 View  02/05/2014   CLINICAL DATA:  DIZZINESS  EXAM: CHEST - 2 VIEW  COMPARISON:  06/08/2010  FINDINGS: Nodular airspace consolidation in the lateral basal segment left lower lobe. Some increase in central pulmonary vascular congestion and coarse perihilar interstitial markings. Moderate cardiomegaly stable. Suspect small bilateral pleural effusions. Multiple compression deformities in the mid and lower thoracic and lumbar spine.  IMPRESSION: 1. New left lower lobe focal consolidation, possibly early pneumonia. Recommend followup to confirm appropriate resolution and exclude neoplasm. 2. Small pleural effusions and cardiomegaly.   Electronically Signed   By: Arne Cleveland M.D.   On: 02/05/2014 13:29    EKG: Independently reviewed.   Assessment/Plan Principal Problem:   Community acquired pneumonia Active Problems:   Vertigo   Atrial fibrillation  80 y.o. female with PMH of HTN, HPL, A fib on AC-coumadin, CHF, pulmonary HTN, ? constrictive pericarditis presented with sudden vertigo while driving, CXR suspicious for developing pneumonia   1. Vertigo, positional likely BPPV; "+" dix haul pike; neuro exam no focal;  -obtain PT eval/epley; obtain MRI r/o CVA due to +a fib, although INR is therapeutic   2. ? Developing pneumonia; CXR LLQ infiltrate; ? underlying CHF;  afebrile; no leukocytosis  -started IV atx, obtain CT chest for better eval (h/o pulmonary nodule); if no infitrates deescalate atx  3. Acute on chronic CHF, ? Constrictive pericarditis;  -echo (2013) LVEF 55%, pulmonary HTN; dilated cardiomyopathy  -Pt is not in acute distress; cont PO diuresis, obtain echo ; daily weight, I/O 4. Permanent a fib; on coaumdin; cont BB, coumadin per pharmacy; check tsh    None;  if consultant consulted, please document name and whether formally or informally consulted  Code Status: full (must indicate code status--if unknown or must be presumed, indicate so) Family Communication:  D/w patient  (indicate person spoken with, if applicable, with phone number if by telephone) Disposition Plan: home pend clinical improvement  (indicate anticipated LOS)  Time spent: >35 minutes   Kinnie Feil [facility] Pager [phone]  If 7PM-7AM, please contact night-coverage [email or web address] Password [facility] 02/05/2014, 2:43 PM

## 2014-02-05 NOTE — ED Notes (Signed)
Pt undressed, in gown, on monitor, continuous pulse oximetry and blood pressure cuff 

## 2014-02-05 NOTE — Progress Notes (Signed)
ANTICOAGULATION CONSULT NOTE - Initial Consult  Pharmacy Consult for Warfarin Indication: atrial fibrillation  Allergies  Allergen Reactions  . Foradil [Formoterol] Other (See Comments)    unknown   Patient Measurements: Height: 5\' 2"  (157.5 cm) Weight: 152 lb 14.4 oz (69.355 kg) (scale C) IBW/kg (Calculated) : 50.1  Vital Signs: Temp: 98.7 F (37.1 C) (06/27 1700) Temp src: Oral (06/27 1700) BP: 152/93 mmHg (06/27 1700) Pulse Rate: 111 (06/27 1700)  Labs:  Recent Labs  02/05/14 1210  HGB 14.1  HCT 43.2  PLT 196  LABPROT 25.1*  INR 2.28*  CREATININE 0.79  TROPONINI <0.30   Estimated Creatinine Clearance: 46.9 ml/min (by C-G formula based on Cr of 0.79).  Medical History: Past Medical History  Diagnosis Date  . Permanent atrial fibrillation   . Pulmonary hypertension   . Lower extremity edema    Medications:  Prescriptions prior to admission  Medication Sig Dispense Refill  . warfarin (COUMADIN) 4 MG tablet Take 2-4 mg by mouth every morning. On Monday and Thursday 4mg .  All other days take 2mg        Assessment: 80 yo female with Afib on chronic Warfarin therapy.  She presents with vertigo which she had a similar episode a few years ago.  Her H/H is stable as well as her platelets.  No noted bleeding complications.  Her current PT/INR is therapeutic at 25.1sec/2.28.  Goal of Therapy:  INR 2-3 Monitor platelets by anticoagulation protocol: Yes   Plan:  1.  Will continue her home regimen of Warfarin 2mg  daily except 4 mg on Monday's and Thursday. 2.  Monitor daily PT/INR for now 3.  F/U CBC and s/s of bleeding.  Rober Minion, PharmD., MS Clinical Pharmacist Pager:  [phone] Thank you for allowing pharmacy to be part of this patients care team. 02/05/2014,5:30 PM

## 2014-02-05 NOTE — Progress Notes (Signed)
1610 tel report received from ED RN

## 2014-02-06 ENCOUNTER — Inpatient Hospital Stay (HOSPITAL_COMMUNITY): Payer: Medicare HMO

## 2014-02-06 DIAGNOSIS — I369 Nonrheumatic tricuspid valve disorder, unspecified: Secondary | ICD-10-CM

## 2014-02-06 LAB — PROTIME-INR
INR: 1.97 — ABNORMAL HIGH (ref 0.00–1.49)
Prothrombin Time: 22.4 seconds — ABNORMAL HIGH (ref 11.6–15.2)

## 2014-02-06 MED ORDER — WARFARIN SODIUM 4 MG PO TABS
4.0000 mg | ORAL_TABLET | Freq: Once | ORAL | Status: DC
  Filled 2014-02-06: qty 1

## 2014-02-06 MED ORDER — METOPROLOL SUCCINATE ER 50 MG PO TB24
50.0000 mg | ORAL_TABLET | Freq: Every day | ORAL | Status: DC
Start: 2014-02-06 — End: 2014-06-01

## 2014-02-06 NOTE — Evaluation (Signed)
Physical Therapy Evaluation Patient Details Name: Wendy Flowers MRN: [id number] DOB: 24-Feb-1927 Today's Date: 02/06/2014   History of Present Illness  HPI: Wendy Flowers is a 80 y.o. female with PMH of HTN,HPL, A fib on AC-coumadin, CHF, pulmonary HTN, ? constrictive pericarditis presented with sudden vertigo while driving;  She reports similar symptoms in the past approximately 2 years ago and was diagnosed with vertigo. She reports lightheadedness for the past 2 days without syncope; she also reported some mild productive cough, with exertional dyspnea; CXR done in ED showed possible pneumonia, and hospitalist called for admission; Pt denies fever, chill,s no chest pain;  No PND, orthopnea, +chronic pedal edema ;  Clinical Impression   Pt admitted with above. Pt currently with functional limitations due to the deficits listed below (see PT Problem List).  Pt will benefit from skilled PT to increase their independence and safety with mobility to allow discharge to the venue listed below.       Follow Up Recommendations Outpatient PT;Other (comment) (for veritgo)    Equipment Recommendations  None recommended by PT    Recommendations for Other Services       Precautions / Restrictions Precautions Precautions: Fall Precaution Comments: symptomatic for dizziness with transitions supine <> sit      Mobility  Bed Mobility Overal bed mobility: Needs Assistance Bed Mobility: Supine to Sit;Sit to Supine     Supine to sit: Supervision Sit to supine: Supervision   General bed mobility comments: Cues to self-monitor for activity tolerance/vertigo; Pt reported in particular feeling "not normal" with coming to sit and laying back down; no obvious nystagmus noted; cues to wait on getting up until feeling of dizziness goes away  Transfers Overall transfer level: Needs assistance Equipment used: None Transfers: Sit to/from Stand Sit to Stand: Supervision         General transfer comment:  Cues to self-monitor for activity tolerance/vertigo  Ambulation/Gait Ambulation/Gait assistance: Min guard Ambulation Distance (Feet): 110 Feet Assistive device: None Gait Pattern/deviations: Step-through pattern Gait velocity: slowed   General Gait Details: No gross loss of balance, but noted tendency to reach out for UE support, especially with fatigue (after stairs)  Stairs Stairs: Yes Stairs assistance: Min guard Stair Management: Two rails;Alternating pattern;Forwards Number of Stairs: 6 General stair comments: Cues to self-monitor for activity tolerance/vertigo  Wheelchair Mobility    Modified Rankin (Stroke Patients Only)       Balance Overall balance assessment: Needs assistance   Sitting balance-Leahy Scale: Good     Standing balance support: No upper extremity supported Standing balance-Leahy Scale: Good                               Pertinent Vitals/Pain no apparent distress     Home Living Family/patient expects to be discharged to:: Private residence Living Arrangements: Alone Available Help at Discharge: Friend(s);Family (nephew lives across the street) Type of Home: House Home Access: Stairs to enter Entrance Stairs-Rails: None Technical brewer of Steps: 4 Home Layout: One level Home Equipment: Cane - single point Additional Comments: has 2 large dogs    Prior Function Level of Independence: Independent;Independent with assistive device(s)         Comments[place]     Hand Dominance        Extremity/Trunk Assessment   Upper Extremity Assessment: Overall WFL for tasks assessed  Lower Extremity Assessment: Overall WFL for tasks assessed         Communication   Communication: No difficulties  Cognition Arousal/Alertness: Awake/alert Behavior During Therapy: WFL for tasks assessed/performed Overall Cognitive Status: Within Functional Limits for tasks  assessed                      General Comments      Exercises        Assessment/Plan    PT Assessment Patient needs continued PT services  PT Diagnosis Other (comment) (vertigo/ questionable vestibular dysfunction)   PT Problem List Decreased balance;Decreased mobility;Decreased knowledge of precautions;Other (comment) (vertigo)  PT Treatment Interventions DME instruction;Gait training;Stair training;Functional mobility training;Therapeutic activities;Therapeutic exercise;Neuromuscular re-education;Patient/family education;Other (comment) (Vestibular rehab)   PT Goals (Current goals can be found in the Care Plan section) Acute Rehab PT Goals Patient Stated Goal: Hopes to go home today PT Goal Formulation: With patient Time For Goal Achievement: 02/13/14 Potential to Achieve Goals: Good    Frequency Min 3X/week   Barriers to discharge Decreased caregiver support Lives alone, but nephew can sheck in on pt (he lives across the street)    Co-evaluation               End of Session Equipment Utilized During Treatment: Gait belt Activity Tolerance: Patient tolerated treatment well Patient left: in bed;with call bell/phone within reach;with nursing/sitter in room Nurse Communication: Mobility status         Time: 0910-0936 PT Time Calculation (min): 26 min   Charges:   PT Evaluation $Initial PT Evaluation Tier I: 1 Procedure PT Treatments $Gait Training: 8-22 mins   PT G Codes:          Quin Hoop 02/06/2014, 12:41 PM Roney Marion, [place] Pager [phone] Office [phone]

## 2014-02-06 NOTE — Progress Notes (Signed)
[facility] PROGRESS NOTE  Wendy Flowers PTW:[id number] DOB: 09-Jul-1927 DOA: 02/05/2014 PCP: Dwan Bolt, MD  Assessment/Plan: Principal Problem:  Community acquired pneumonia  Active Problems:  Vertigo  Atrial fibrillation   80 y.o. female with PMH of HTN, HPL, A fib on AC-coumadin, CHF, pulmonary HTN, ? constrictive pericarditis presented with sudden vertigo while driving   1. Vertigo, positional likely BPPV; "+" dix haul pike; neuro exam no focal;  -obtain PT eval/epley; pend MRI r/o CVA due to +a fib, although INR was therapeutic on admit 2. Initially thought ? pneumonia; CXR LLQ infiltrate; but afebrile; no leukocytosis  -CT chest: no infiltrates, no edema; d/c atx  3. Acute on chronic CHF, ? Constrictive pericarditis;  -echo (2013) LVEF 55%, pulmonary HTN; dilated cardiomyopathy  -Pt is not in acute distress; cont PO diuresis, obtain echo ; daily weight, I/O  4. Permanent a fib-RVR; on coaumdin; cont BB (Pt was confused with BB, 25 XL taking BID),  -cont metoprolol 50 XL QD/adjuast as needed;  coumadin per pharmacy; check tsh -0.8   None; if consultant consulted, please document name and whether formally or informally consulted  Code Status: full (must indicate code status--if unknown or must be presumed, indicate so)  Family Communication: D/w patient (indicate person spoken with, if applicable, with phone number if by telephone)  Disposition Plan: home pend clinical improvement (indicate anticipated LOS)   Consultants:  none  Procedures:  Echo pend   Antibiotics:  None  (indicate start date, and stop date if known)  HPI/Subjective: alert  Objective: Filed Vitals:   02/06/14 0537  BP: 111/61  Pulse: 122  Temp: 97.6 F (36.4 C)  Resp: 18   No intake or output data in the 24 hours ending 02/06/14 0824 Filed Weights   02/05/14 1700 02/06/14 0537  Weight: 69.355 kg (152 lb 14.4 oz) 68.584 kg (151 lb 3.2 oz)    Exam:   General:   alert  Cardiovascular: s1,s2 rrr  Respiratory: few LL crackles   Abdomen: soft, nt,nd   Musculoskeletal: midl pedal edema   Data Reviewed: Basic Metabolic Panel:  Recent Labs Lab 02/05/14 1210  NA 144  K 3.8  CL 104  CO2 27  GLUCOSE 114*  BUN 18  CREATININE 0.79  CALCIUM 9.1   Liver Function Tests:  Recent Labs Lab 02/05/14 1210  AST 20  ALT 15  ALKPHOS 61  BILITOT 0.7  PROT 6.9  ALBUMIN 3.6   No results found for this basename: LIPASE, AMYLASE,  in the last 168 hours No results found for this basename: AMMONIA,  in the last 168 hours CBC:  Recent Labs Lab 02/05/14 1210  WBC 4.4  NEUTROABS 2.8  HGB 14.1  HCT 43.2  MCV 90.6  PLT 196   Cardiac Enzymes:  Recent Labs Lab 02/05/14 1210  TROPONINI <0.30   BNP (last 3 results)  Recent Labs  02/05/14 1210  PROBNP 899.9*   CBG: No results found for this basename: GLUCAP,  in the last 168 hours  No results found for this or any previous visit (from the past 240 hour(s)).   Studies: Dg Chest 2 View  02/05/2014   CLINICAL DATA:  DIZZINESS  EXAM: CHEST - 2 VIEW  COMPARISON:  06/08/2010  FINDINGS: Nodular airspace consolidation in the lateral basal segment left lower lobe. Some increase in central pulmonary vascular congestion and coarse perihilar interstitial markings. Moderate cardiomegaly stable. Suspect small bilateral pleural effusions. Multiple compression deformities in the mid and lower thoracic and  lumbar spine.  IMPRESSION: 1. New left lower lobe focal consolidation, possibly early pneumonia. Recommend followup to confirm appropriate resolution and exclude neoplasm. 2. Small pleural effusions and cardiomegaly.   Electronically Signed   By: Arne Cleveland M.D.   On: 02/05/2014 13:29   Ct Chest Wo Contrast  02/05/2014   CLINICAL DATA:  Evaluate "Infiltrates" versus edema. Atrial fibrillation. Pulmonary hypertension. Extreme dizziness.  EXAM: CT CHEST WITHOUT CONTRAST  TECHNIQUE: Multidetector CT  imaging of the chest was performed following the standard protocol without IV contrast.  COMPARISON:  Chest radiograph of earlier today. Chest CT of 03/23/2010  FINDINGS: Lungs/Pleura: Patchy areas of subsegmental atelectasis in the lung bases bilaterally. 3 mm lingular nodule on image 23 is unchanged, consistent with a benign etiology. No evidence of pneumonia.  Small left greater than right bilateral pleural effusions.  Heart/Mediastinum: Tortuous descending thoracic aorta. Mild cardiomegaly, without pericardial effusion. No mediastinal or definite hilar adenopathy, given limitations of unenhanced CT.  Upper Abdomen: Bilateral hepatic cysts. Normal imaged portions of the spleen, stomach, adrenal glands.  Bones/Musculoskeletal: Accentuation of thoracic kyphosis. Compression deformities at multiple thoracic levels. Similar in configuration on the prior exam. Most severe at T6. No ventral canal encroachment.  IMPRESSION: 1. No evidence of pneumonia or pulmonary edema. 2. Small bilateral pleural effusions with patchy areas of lower lobe subsegmental atelectasis. 3. Cardiomegaly. 4. Osteopenia with similar multilevel thoracic compression deformities.   Electronically Signed   By: Abigail Miyamoto M.D.   On: 02/05/2014 19:24    Scheduled Meds: . azithromycin  500 mg Intravenous Q24H  . calcium-vitamin D  1 tablet Oral BID  . cefTRIAXone (ROCEPHIN)  IV  1 g Intravenous Q24H  . furosemide  40 mg Oral Daily  . levothyroxine  125 mcg Oral QAC breakfast  . metoprolol succinate  50 mg Oral Daily  . simvastatin  40 mg Oral QPM  . sodium chloride  3 mL Intravenous Q12H  . warfarin  2 mg Oral Once per day on Sun Tue Wed Fri Sat   And  . [START ON 02/07/2014] warfarin  4 mg Oral Once per day on Mon Thu  . Warfarin - Pharmacist Dosing Inpatient   Does not apply q1800   Continuous Infusions:   Principal Problem:   Community acquired pneumonia Active Problems:   Vertigo   Atrial fibrillation   CAP (community  acquired pneumonia)    Time spent: >35 minutes     Kinnie Feil  [facility] Pager [phone]. If 7PM-7AM, please contact night-coverage at [email or web address], password [facility] 02/06/2014, 8:24 AM  LOS: 1 day

## 2014-02-06 NOTE — Discharge Summary (Addendum)
Physician Discharge Summary  Wendy Flowers IZT:[id number] DOB: 1926-11-05 DOA: 02/05/2014  PCP: Dwan Bolt, MD  Admit date: 02/05/2014 Discharge date: 02/06/2014  Time spent: >35 minutes  Recommendations for Outpatient Follow-up:  F/u with cardiologist in 1-2 week s F/u with PCP in 1 week  Outpatient PT Discharge Diagnoses:  Principal Problem:   Community acquired pneumonia Active Problems:   Vertigo   Atrial fibrillation   CAP (community acquired pneumonia)   Discharge Condition: stable   Diet recommendation: low sodium   Filed Weights   02/05/14 1700 02/06/14 0537  Weight: 69.355 kg (152 lb 14.4 oz) 68.584 kg (151 lb 3.2 oz)    History of present illness:  Principal Problem:  Community acquired pneumonia  Active Problems:  Vertigo  Atrial fibrillation    80 y.o. female with PMH of HTN, HPL, A fib on AC-coumadin, CHF, pulmonary HTN, ? constrictive pericarditis presented with sudden vertigo while driving   Hospital Course:  1. Vertigo, positional likely BPPV; "+" dix [place]; neuro exam no focal;  -resolved; cont OP/PT eval/epley; MRI neg a for acute findings; recommended avoid driving until reevaluated by PCP  2. Initially thought ? pneumonia; CXR LLQ infiltrate; but afebrile; no leukocytosis; denies productive cough  -CT chest: no infiltrates/edema; d/c atx; OP follow up   3. Mild acute on chronic CHF, per recent cardiology notes suspected restrictive CHF, but not a good candidate for surgery;  -echo (2013) LVEF 55%, pulmonary HTN; dilated cardiomyopathy  -echo (6/28): LVEF 45-50%, restrictive physiology -Pt is not in acute distress; cont PO diuresis, recommended to take extra 20 mg as needed;   4. Permanent a fib-RVR; on coaumdin; cont BB (Pt was confused with BB, 25 XL taking BID),  -cont metoprolol 50 XL QD/adjuast as needed OP; cont coumadin per pharmacy; tsh -0.8  D/w patient, recommended to f/u with cardiology with Dr. Sallyanne Kuster in 2 weeks     Echo: Study Conclusions  - Left ventricle: The cavity size was normal. Wall thickness was increased in a pattern of moderate LVH. Systolic function was mildly reduced. The estimated ejection fraction was in the range of 45% to 50%. Diffuse hypokinesis. - Aortic valve: Valve area (Vmax): 2.2 cm^2. - Mitral valve: There was mild regurgitation. - Left atrium: The atrium was severely dilated. - Right atrium: The atrium was severely dilated. - Tricuspid valve: There was mild-moderate regurgitation. - Impressions: Severe biatrial enlargement suggestive of restrictive physiology.  Impressions:  - Severe biatrial enlargement suggestive of restrictive physiology.   Procedures:  Echo  (i.e. Studies not automatically included, echos, thoracentesis, etc; not x-rays)  Consultations:  none  Discharge Exam: Filed Vitals:   02/06/14 1300  BP: 115/67  Pulse: 104  Temp: 97.4 F (36.3 C)  Resp: 20    General: alert Cardiovascular: s1,s2 rrr Respiratory: crackles improved   Discharge Instructions  Discharge Instructions   Diet - low sodium heart healthy    Complete by:  As directed      Discharge instructions    Complete by:  As directed   Please follow up with cardiologist in 2 weeks     Increase activity slowly    Complete by:  As directed             Medication List         Calcium + D3 600-200 MG-UNIT Tabs  Take 1 tablet by mouth 2 (two) times daily.     furosemide 40 MG tablet  Commonly known as:  LASIX  Take  40 mg by mouth.     levothyroxine 125 MCG tablet  Commonly known as:  SYNTHROID, LEVOTHROID  Take 125 mcg by mouth daily before breakfast.     metoprolol succinate 50 MG 24 hr tablet  Commonly known as:  TOPROL-XL  Take 1 tablet (50 mg total) by mouth daily. Take 1/2 tablet in morning and 1/2 tablet in the evening     PROLIA 60 MG/ML Soln injection  Generic drug:  denosumab  Inject 60 mg into the skin every 6 (six) months.     simvastatin 40 MG  tablet  Commonly known as:  ZOCOR  Take 40 mg by mouth every evening.     warfarin 4 MG tablet  Commonly known as:  COUMADIN  Take 2-4 mg by mouth every morning. On Monday and Thursday 4mg .  All other days take 2mg        Allergies  Allergen Reactions  . Foradil [Formoterol] Other (See Comments)    unknown       Follow-up Information   Follow up with CROITORU,MIHAI, MD. Schedule an appointment as soon as possible for a visit in 2 weeks.   Specialty:  Cardiology   Contact information:   [address] [place] [address] [phone]       Follow up with Dwan Bolt, MD In 1 week.   Specialty:  Endocrinology   Contact information:   [address] [place] [address] [phone]        The results of significant diagnostics from this hospitalization (including imaging, microbiology, ancillary and laboratory) are listed below for reference.    Significant Diagnostic Studies: Dg Chest 2 View  02/05/2014   CLINICAL DATA:  DIZZINESS  EXAM: CHEST - 2 VIEW  COMPARISON:  06/08/2010  FINDINGS: Nodular airspace consolidation in the lateral basal segment left lower lobe. Some increase in central pulmonary vascular congestion and coarse perihilar interstitial markings. Moderate cardiomegaly stable. Suspect small bilateral pleural effusions. Multiple compression deformities in the mid and lower thoracic and lumbar spine.  IMPRESSION: 1. New left lower lobe focal consolidation, possibly early pneumonia. Recommend followup to confirm appropriate resolution and exclude neoplasm. 2. Small pleural effusions and cardiomegaly.   Electronically Signed   By: Arne Cleveland M.D.   On: 02/05/2014 13:29   Ct Chest Wo Contrast  02/05/2014   CLINICAL DATA:  Evaluate "Infiltrates" versus edema. Atrial fibrillation. Pulmonary hypertension. Extreme dizziness.  EXAM: CT CHEST WITHOUT CONTRAST  TECHNIQUE: Multidetector CT imaging of the chest was performed  following the standard protocol without IV contrast.  COMPARISON:  Chest radiograph of earlier today. Chest CT of 03/23/2010  FINDINGS: Lungs/Pleura: Patchy areas of subsegmental atelectasis in the lung bases bilaterally. 3 mm lingular nodule on image 23 is unchanged, consistent with a benign etiology. No evidence of pneumonia.  Small left greater than right bilateral pleural effusions.  Heart/Mediastinum: Tortuous descending thoracic aorta. Mild cardiomegaly, without pericardial effusion. No mediastinal or definite hilar adenopathy, given limitations of unenhanced CT.  Upper Abdomen: Bilateral hepatic cysts. Normal imaged portions of the spleen, stomach, adrenal glands.  Bones/Musculoskeletal: Accentuation of thoracic kyphosis. Compression deformities at multiple thoracic levels. Similar in configuration on the prior exam. Most severe at T6. No ventral canal encroachment.  IMPRESSION: 1. No evidence of pneumonia or pulmonary edema. 2. Small bilateral pleural effusions with patchy areas of lower lobe subsegmental atelectasis. 3. Cardiomegaly. 4. Osteopenia with similar multilevel thoracic compression deformities.   Electronically Signed   By: Adria Devon.D.  On: 02/05/2014 19:24   Mr Brain Wo Contrast  02/06/2014   CLINICAL DATA:  80 year old female with vertigo of sudden onset. Initial encounter.  EXAM: MRI HEAD WITHOUT CONTRAST  TECHNIQUE: Multiplanar, multiecho pulse sequences of the brain and surrounding structures were obtained without intravenous contrast.  COMPARISON:  Head CT without contrast 03/22/2013.  FINDINGS: Cerebral volume is within normal limits for age. No restricted diffusion to suggest acute infarction. No midline shift, mass effect, evidence of mass lesion, ventriculomegaly, extra-axial collection or acute intracranial hemorrhage. Cervicomedullary junction and pituitary are within normal limits. Negative visualized cervical spine. Normal bone marrow signal. Major intracranial vascular  flow voids are preserved.  Scattered and patchy cerebral white matter T2 and FLAIR hyperintensity. Some involvement of the deep white matter capsules. Extent is moderate for age. No cortical encephalomalacia. Mild for age T2 heterogeneity in the deep gray matter nuclei, in part due to perivascular spaces. There might be a tiny chronic micro hemorrhage in the right thalamus seen on series 8, image 86. Brainstem and cerebellum are within normal limits. Visible internal auditory structures appear normal.  Mastoids are clear. Minor paranasal sinus mucosal thickening. Postoperative changes to the globes. Visualized scalp soft tissues are within normal limits.  IMPRESSION: 1.  No acute intracranial abnormality. 2. Mild to moderate for age signal changes most commonly seen with chronic small vessel disease.   Electronically Signed   By: Lars Pinks M.D.   On: 02/06/2014 11:31    Microbiology: No results found for this or any previous visit (from the past 240 hour(s)).   Labs: Basic Metabolic Panel:  Recent Labs Lab 02/05/14 1210  NA 144  K 3.8  CL 104  CO2 27  GLUCOSE 114*  BUN 18  CREATININE 0.79  CALCIUM 9.1   Liver Function Tests:  Recent Labs Lab 02/05/14 1210  AST 20  ALT 15  ALKPHOS 61  BILITOT 0.7  PROT 6.9  ALBUMIN 3.6   No results found for this basename: LIPASE, AMYLASE,  in the last 168 hours No results found for this basename: AMMONIA,  in the last 168 hours CBC:  Recent Labs Lab 02/05/14 1210  WBC 4.4  NEUTROABS 2.8  HGB 14.1  HCT 43.2  MCV 90.6  PLT 196   Cardiac Enzymes:  Recent Labs Lab 02/05/14 1210  TROPONINI <0.30   BNP: BNP (last 3 results)  Recent Labs  02/05/14 1210  PROBNP 899.9*   CBG: No results found for this basename: GLUCAP,  in the last 168 hours     Signed:  Kinnie Feil  [facility] 02/06/2014, 3:38 PM

## 2014-02-06 NOTE — Progress Notes (Signed)
Echocardiogram 2D Echocardiogram has been performed.  Doyle Askew 02/06/2014, 12:24 PM

## 2014-02-06 NOTE — Progress Notes (Signed)
Utilization Review Completed.[name] T6/28/2015  

## 2014-02-06 NOTE — Progress Notes (Signed)
ANTICOAGULATION CONSULT NOTE - Initial Consult  Pharmacy Consult for Warfarin Indication: atrial fibrillation  Allergies  Allergen Reactions  . Foradil [Formoterol] Other (See Comments)    unknown   Patient Measurements: Height: 5\' 2"  (157.5 cm) Weight: 151 lb 3.2 oz (68.584 kg) (scale c) IBW/kg (Calculated) : 50.1  Vital Signs: Temp: 97.6 F (36.4 C) (06/28 0537) Temp src: Oral (06/28 0537) BP: 111/61 mmHg (06/28 0537) Pulse Rate: 122 (06/28 0537)  Labs:  Recent Labs  02/05/14 1210 02/06/14 0440  HGB 14.1  --   HCT 43.2  --   PLT 196  --   LABPROT 25.1* 22.4*  INR 2.28* 1.97*  CREATININE 0.79  --   TROPONINI <0.30  --    Estimated Creatinine Clearance: 46.7 ml/min (by C-G formula based on Cr of 0.79).  Medical History: Past Medical History  Diagnosis Date  . Permanent atrial fibrillation   . Pulmonary hypertension   . Lower extremity edema    Medications:  Prescriptions prior to admission  Medication Sig Dispense Refill  . warfarin (COUMADIN) 4 MG tablet Take 2-4 mg by mouth every morning. On Monday and Thursday 4mg .  All other days take 2mg        Assessment: 80 yo female with Afib on chronic Warfarin therapy.  She presents with vertigo which she had a similar episode a few years ago.  Her H/H is wnl as well as her platelets.  No noted bleeding complications. INR on admit was therapeutic at 2.28 on admit so home dose was restarted. Today her INR has decreased and is slightly SUBtherapeutic at 1.97 so will give slightly increased dose tonight.   Home dose: 2 mg daily except 4 mg Mon, Thurs  Goal of Therapy:  INR 2-3 Monitor platelets by anticoagulation protocol: Yes   Plan:  - Coumadin 4 mg PO x 1 tonight - Monitor daily PT/INR for now - F/U CBC and s/s of bleeding  Harolyn Rutherford, PharmD Clinical Pharmacist - Resident Pager: [phone] Pharmacy: [phone] 02/06/2014 11:37 AM

## 2014-02-06 NOTE — Progress Notes (Signed)
Upon initial assessment, pts HR 120-130's. Am Metoprolol given with hr down to around 100

## 2014-02-06 NOTE — Discharge Instructions (Signed)
Heart Failure  Heart failure is a condition in which the heart has trouble pumping blood. This means your heart does not pump blood efficiently for your body to work well. In some cases of heart failure, fluid may back up into your lungs or you may have swelling (edema) in your lower legs. Heart failure is usually a long-term (chronic) condition. It is important for you to take good care of yourself and follow your caregiver's treatment plan.  CAUSES   Some health conditions can cause heart failure. Those health conditions include:  · High blood pressure (hypertension) causes the heart muscle to work harder than normal. When pressure in the blood vessels is high, the heart needs to pump (contract) with more force in order to circulate blood throughout the body. High blood pressure eventually causes the heart to become stiff and weak.  · Coronary artery disease (CAD) is the buildup of cholesterol and fat (plaque) in the arteries of the heart. The blockage in the arteries deprives the heart muscle of oxygen and blood. This can cause chest pain and may lead to a heart attack. High blood pressure can also contribute to CAD.  · Heart attack (myocardial infarction) occurs when 1 or more arteries in the heart become blocked. The loss of oxygen damages the muscle tissue of the heart. When this happens, part of the heart muscle dies. The injured tissue does not contract as well and weakens the heart's ability to pump blood.  · Abnormal heart valves can cause heart failure when the heart valves do not open and close properly. This makes the heart muscle pump harder to keep the blood flowing.  · Heart muscle disease (cardiomyopathy or myocarditis) is damage to the heart muscle from a variety of causes. These can include drug or alcohol abuse, infections, or unknown reasons. These can increase the risk of heart failure.  · Lung disease makes the heart work harder because the lungs do not work properly. This can cause a strain  on the heart, leading it to fail.  · Diabetes increases the risk of heart failure. High blood sugar contributes to high fat (lipid) levels in the blood. Diabetes can also cause slow damage to tiny blood vessels that carry important nutrients to the heart muscle. When the heart does not get enough oxygen and food, it can cause the heart to become weak and stiff. This leads to a heart that does not contract efficiently.  · Other conditions can contribute to heart failure. These include abnormal heart rhythms, thyroid problems, and low blood counts (anemia).  Certain unhealthy behaviors can increase the risk of heart failure. Those unhealthy behaviors include:  · Being overweight.  · Smoking or chewing tobacco.  · Eating foods high in fat and cholesterol.  · Abusing illicit drugs or alcohol.  · Lacking physical activity.  SYMPTOMS   Heart failure symptoms may vary and can be hard to detect. Symptoms may include:  · Shortness of breath with activity, such as climbing stairs.  · Persistent cough.  · Swelling of the feet, ankles, legs, or abdomen.  · Unexplained weight gain.  · Difficulty breathing when lying flat (orthopnea).  · Waking from sleep because of the need to sit up and get more air.  · Rapid heartbeat.  · Fatigue and loss of energy.  · Feeling lightheaded, dizzy, or close to fainting.  · Loss of appetite.  · Nausea.  · Increased urination during the night (nocturia).  DIAGNOSIS   A diagnosis   of heart failure is based on your history, symptoms, physical examination, and diagnostic tests.  Diagnostic tests for heart failure may include:  · Echocardiography.  · Electrocardiography.  · Chest X-ray.  · Blood tests.  · Exercise stress test.  · Cardiac angiography.  · Radionuclide scans.  TREATMENT   Treatment is aimed at managing the symptoms of heart failure. Medicines, behavioral changes, or surgical intervention may be necessary to treat heart failure.  · Medicines to help treat heart failure may  include:  ¨ Angiotensin-converting enzyme (ACE) inhibitors. This type of medicine blocks the effects of a blood protein called angiotensin-converting enzyme. ACE inhibitors relax (dilate) the blood vessels and help lower blood pressure.  ¨ Angiotensin receptor blockers. This type of medicine blocks the actions of a blood protein called angiotensin. Angiotensin receptor blockers dilate the blood vessels and help lower blood pressure.  ¨ Water pills (diuretics). Diuretics cause the kidneys to remove salt and water from the blood. The extra fluid is removed through urination. This loss of extra fluid lowers the volume of blood the heart pumps.  ¨ Beta blockers. These prevent the heart from beating too fast and improve heart muscle strength.  ¨ Digitalis. This increases the force of the heartbeat.  · Healthy behavior changes include:  ¨ Obtaining and maintaining a healthy weight.  ¨ Stopping smoking or chewing tobacco.  ¨ Eating heart healthy foods.  ¨ Limiting or avoiding alcohol.  ¨ Stopping illicit drug use.  ¨ Physical activity as directed by your caregiver.  · Surgical treatment for heart failure may include:  ¨ A procedure to open blocked arteries, repair damaged heart valves, or remove damaged heart muscle tissue.  ¨ A pacemaker to improve heart muscle function and control certain abnormal heart rhythms.  ¨ An internal cardioverter defibrillator to treat certain serious abnormal heart rhythms.  ¨ A left ventricular assist device to assist the pumping ability of the heart.  HOME CARE INSTRUCTIONS   · Take your medicine as directed by your caregiver. Medicines are important in reducing the workload of your heart, slowing the progression of heart failure, and improving your symptoms.  ¨ Do not stop taking your medicine unless directed by your caregiver.  ¨ Do not skip any dose of medicine.  ¨ Refill your prescriptions before you run out of medicine. Your medicines are needed every day.  ¨ Take over-the-counter  medicine only as directed by your caregiver or pharmacist.  · Engage in moderate physical activity if directed by your caregiver. Moderate physical activity can benefit some people. The elderly and people with severe heart failure should consult with a caregiver for physical activity recommendations.  · Eat heart healthy foods. Food choices should be free of trans fat and low in saturated fat, cholesterol, and salt (sodium). Healthy choices include fresh or frozen fruits and vegetables, fish, lean meats, legumes, fat-free or low-fat dairy products, and whole grain or high fiber foods. Talk to a dietitian to learn more about heart healthy foods.  · Limit sodium if directed by your caregiver. Sodium restriction may reduce symptoms of heart failure in some people. Talk to a dietitian to learn more about heart healthy seasonings.  · Use healthy cooking methods. Healthy cooking methods include roasting, grilling, broiling, baking, poaching, steaming, or stir-frying. Talk to a dietitian to learn more about healthy cooking methods.  · Limit fluids if directed by your caregiver. Fluid restriction may reduce symptoms of heart failure in some people.  · Weigh yourself every day.   Daily weights are important in the early recognition of excess fluid. You should weigh yourself every morning after you urinate and before you eat breakfast. Wear the same amount of clothing each time you weigh yourself. Record your daily weight. Provide your caregiver with your weight record.  · Monitor and record your blood pressure if directed by your caregiver.  · Check your pulse if directed by your caregiver.  · Lose weight if directed by your caregiver. Weight loss may reduce symptoms of heart failure in some people.  · Stop smoking or chewing tobacco. Nicotine makes your heart work harder by causing your blood vessels to constrict. Do not use nicotine gum or patches before talking to your caregiver.  · Schedule and attend follow-up visits as  directed by your caregiver. It is important to keep all your appointments.  · Limit alcohol intake to no more than 1 drink per day for nonpregnant women and 2 drinks per day for men. Drinking more than that is harmful to your heart. Tell your caregiver if you drink alcohol several times a week. Talk with your caregiver about whether alcohol is safe for you. If your heart has already been damaged by alcohol or you have severe heart failure, drinking alcohol should be stopped completely.  · Stop illicit drug use.  · Stay up-to-date with immunizations. It is especially important to prevent respiratory infections through current pneumococcal and influenza immunizations.  · Manage other health conditions such as hypertension, diabetes, thyroid disease, or abnormal heart rhythms as directed by your caregiver.  · Learn to manage stress.  · Plan rest periods when fatigued.  · Learn strategies to manage high temperatures. If the weather is extremely hot:  ¨ Avoid vigorous physical activity.  ¨ Use air conditioning or fans or seek a cooler location.  ¨ Avoid caffeine and alcohol.  ¨ Wear loose-fitting, lightweight, and light-colored clothing.  · Learn strategies to manage cold temperatures. If the weather is extremely cold:  ¨ Avoid vigorous physical activity.  ¨ Layer clothes.  ¨ Wear mittens or gloves, a hat, and a scarf when going outside.  ¨ Avoid alcohol.  · Obtain ongoing education and support as needed.  · Participate or seek rehabilitation as needed to maintain or improve independence and quality of life.  SEEK MEDICAL CARE IF:   · Your weight increases by 03 lb/1.4 kg in 1 day or 05 lb/2.3 kg in a week.  · You have increasing shortness of breath that is unusual for you.  · You are unable to participate in your usual physical activities.  · You tire easily.  · You cough more than normal, especially with physical activity.  · You have any or more swelling in areas such as your hands, feet, ankles, or abdomen.  · You  are unable to sleep because it is hard to breathe.  · You feel like your heart is beating fast (palpitations).  · You become dizzy or lightheaded upon standing up.  SEEK IMMEDIATE MEDICAL CARE IF:   · You have difficulty breathing.  · There is a change in mental status such as decreased alertness or difficulty with concentration.  · You have a pain or discomfort in your chest.  · You have an episode of fainting (syncope).  MAKE SURE YOU:   · Understand these instructions.  · Will watch your condition.  · Will get help right away if you are not doing well or get worse.  Document Released: 07/29/2005 Document Revised: 11/23/2012   Document Reviewed: 08/20/2012  [employer]® Patient Information ©2015 [employer]. This information is not intended to replace advice given to you by your health care provider. Make sure you discuss any questions you have with your health care provider.

## 2014-02-08 NOTE — ED Provider Notes (Signed)
80 y.o.  Female with lightheadedness, vertigo, and generalized weakness presents for same.  EKG  EKG Interpretation  Date/Time:  Saturday February 05 2014 11:38:44 EDT Ventricular Rate:  108 PR Interval:    QRS Duration: 92 QT Interval:  353 QTC Calculation: 473 R Axis:   91 Text Interpretation:  Atrial fibrillation Right axis deviation ED PHYSICIAN INTERPRETATION AVAILABLE IN [facility] Confirmed by TEST, Record ([id number]) on 02/07/2014 7:47:03 AM      Dg Chest 2 View  02/05/2014   CLINICAL DATA:  DIZZINESS  EXAM: CHEST - 2 VIEW  COMPARISON:  06/08/2010  FINDINGS: Nodular airspace consolidation in the lateral basal segment left lower lobe. Some increase in central pulmonary vascular congestion and coarse perihilar interstitial markings. Moderate cardiomegaly stable. Suspect small bilateral pleural effusions. Multiple compression deformities in the mid and lower thoracic and lumbar spine.  IMPRESSION: 1. New left lower lobe focal consolidation, possibly early pneumonia. Recommend followup to confirm appropriate resolution and exclude neoplasm. 2. Small pleural effusions and cardiomegaly.   Electronically Signed   By: Arne Cleveland M.D.   On: 02/05/2014 13:29   Patient with new infiltrate on cxr and pleural effusion.  Treated here for cap.   I performed a history and physical examination of Wendy Flowers and discussed her management with Ms. Jerline Pain.  I agree with the history, physical, assessment, and plan of care, with the following exceptions: None  I was present for the following procedures: None Time Spent in Critical Care of the patient: None Time spent in discussions with the patient and family: 5  RAY,DANIELLE S    Shaune Pollack, MD 02/08/14 1335

## 2014-02-10 ENCOUNTER — Telehealth: Payer: Self-pay | Admitting: Cardiovascular Disease

## 2014-02-12 LAB — CULTURE, BLOOD (ROUTINE X 2)
CULTURE: NO GROWTH
Culture: NO GROWTH

## 2014-02-14 NOTE — Telephone Encounter (Signed)
Closed encounter °

## 2014-02-28 ENCOUNTER — Encounter: Payer: Self-pay | Admitting: Cardiology

## 2014-02-28 ENCOUNTER — Ambulatory Visit (INDEPENDENT_AMBULATORY_CARE_PROVIDER_SITE_OTHER): Payer: Medicare HMO | Admitting: Cardiology

## 2014-02-28 VITALS — BP 141/94 | HR 103 | Ht 62.0 in | Wt 152.7 lb

## 2014-02-28 DIAGNOSIS — M40209 Unspecified kyphosis, site unspecified: Secondary | ICD-10-CM | POA: Insufficient documentation

## 2014-02-28 DIAGNOSIS — I4891 Unspecified atrial fibrillation: Secondary | ICD-10-CM

## 2014-02-28 DIAGNOSIS — Z7901 Long term (current) use of anticoagulants: Secondary | ICD-10-CM

## 2014-02-28 DIAGNOSIS — R42 Dizziness and giddiness: Secondary | ICD-10-CM

## 2014-02-28 DIAGNOSIS — I2789 Other specified pulmonary heart diseases: Secondary | ICD-10-CM

## 2014-02-28 DIAGNOSIS — M4000 Postural kyphosis, site unspecified: Secondary | ICD-10-CM

## 2014-02-28 DIAGNOSIS — M40204 Unspecified kyphosis, thoracic region: Secondary | ICD-10-CM

## 2014-02-28 DIAGNOSIS — I4821 Permanent atrial fibrillation: Secondary | ICD-10-CM

## 2014-02-28 NOTE — Assessment & Plan Note (Signed)
Mild to moderate
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
## 2014-02-28 NOTE — Patient Instructions (Signed)
Your physician recommends that you schedule a follow-up appointment in: 3 Months with Dr Croitoru  

## 2014-02-28 NOTE — Assessment & Plan Note (Signed)
Admitted overnight 02/05/14. Antivert has helped.

## 2014-02-28 NOTE — Assessment & Plan Note (Signed)
Asymptomatic. 

## 2014-02-28 NOTE — Assessment & Plan Note (Signed)
Warfarin 

## 2014-02-28 NOTE — Assessment & Plan Note (Signed)
Some degree of restrictive lung diease

## 2014-06-01 ENCOUNTER — Ambulatory Visit (INDEPENDENT_AMBULATORY_CARE_PROVIDER_SITE_OTHER): Payer: Medicare HMO | Admitting: Cardiovascular Disease

## 2014-06-01 VITALS — BP 134/84 | HR 118 | Resp 16 | Ht 62.0 in | Wt 159.4 lb

## 2014-06-01 DIAGNOSIS — M419 Scoliosis, unspecified: Secondary | ICD-10-CM

## 2014-06-01 DIAGNOSIS — I482 Chronic atrial fibrillation, unspecified: Secondary | ICD-10-CM

## 2014-06-01 DIAGNOSIS — Z7901 Long term (current) use of anticoagulants: Secondary | ICD-10-CM

## 2014-06-01 DIAGNOSIS — I10 Essential (primary) hypertension: Secondary | ICD-10-CM

## 2014-06-01 DIAGNOSIS — I2721 Secondary pulmonary arterial hypertension: Secondary | ICD-10-CM

## 2014-06-01 DIAGNOSIS — I272 Other secondary pulmonary hypertension: Secondary | ICD-10-CM

## 2014-06-01 DIAGNOSIS — J984 Other disorders of lung: Secondary | ICD-10-CM

## 2014-06-01 DIAGNOSIS — I4821 Permanent atrial fibrillation: Secondary | ICD-10-CM

## 2014-06-01 MED ORDER — METOPROLOL SUCCINATE ER 100 MG PO TB24: ORAL_TABLET | ORAL | Status: DC

## 2014-06-01 NOTE — Patient Instructions (Signed)
INCREASE METOPROLOL SUCC TO 100MG  TAKE 1/2 TABLET TWICE A DAY.  PLEASE HAVE DR. America Brown OFFICE DO AN EKG TOMORROW AND FAX RESULTS TO DR. RAQTMAUQ AT [phone].  Dr. Sallyanne Kuster recommends that you schedule a follow-up appointment in: 3-4 MONTHS

## 2014-06-02 ENCOUNTER — Encounter: Payer: Self-pay | Admitting: Cardiovascular Disease

## 2014-06-02 DIAGNOSIS — M419 Scoliosis, unspecified: Secondary | ICD-10-CM

## 2014-06-02 DIAGNOSIS — J984 Other disorders of lung: Secondary | ICD-10-CM | POA: Insufficient documentation

## 2014-06-02 NOTE — Progress Notes (Signed)
Patient ID: Wendy Flowers, female   DOB: [birth year], 80 y.o.   MRN: [id number]     Reason for office visit atrial fibrillation, pulmonary HTN  Mrs. Fambro continues to have mild to moderate shortness of breath on exertion (functional class II) and mild to moderate lower extremity edema. She remains in permanent atrial fibrillation and has not had any bleeding complications while on warfarin anticoagulation. She has not had a stroke or TIA.   She has mild to moderate pulmonary hypertension confirmed by right heart catheterization (PA pressure 49/20, mean 30, PA wedge pressure mean 16 mm Hg), but this appears to be stable in severity and is not clearly explained. The most likely cause is restrictive lung disease from fairly significant thoracic kyphoscoliosis in turn related to osteoporosis. She does not have a history of smoking does not have signs or symptoms of obstructive sleep apnea and has never had known venous thromboembolic events. A summary workup for inflammatory/autoimmune disorders in the past has been unrevealing. Her forced vital capacity is roughly 62% of predicted and does not change with bronchodilator therapy. No mention is made of a pericardial abnormalities on the CT of the chest from 2011.   Confusion persists around her metoprolol prescription and I think this is the reason why she is tachycardic today -AF with RVR 118 bpm. She is taking metoprolol succinate 25 mg BID (had been on metoprolol tartrate in the past, 50 mg BID).  Allergies  Allergen Reactions  . Foradil [Formoterol] Other (See Comments)    unknown    Current Outpatient Prescriptions  Medication Sig Dispense Refill  . Calcium Carb-Cholecalciferol (CALCIUM + D3) 600-200 MG-UNIT TABS Take 1 tablet by mouth 2 (two) times daily.      . furosemide (LASIX) 40 MG tablet Take 40 mg by mouth.      . levothyroxine (SYNTHROID, LEVOTHROID) 125 MCG tablet Take 125 mcg by mouth daily before breakfast.      . metoprolol succinate  (TOPROL-XL) 100 MG 24 hr tablet Take 1/2 tablet in morning and 1/2 tablet in the evening  30 tablet  11  . PROLIA 60 MG/ML SOLN injection Inject 60 mg into the skin every 6 (six) months.      . simvastatin (ZOCOR) 40 MG tablet Take 40 mg by mouth every evening.      . warfarin (COUMADIN) 4 MG tablet Take 2-4 mg by mouth every morning. On Monday and Thursday 4mg .  All other days take 2mg        No current facility-administered medications for this visit.    Past Medical History  Diagnosis Date  . Permanent atrial fibrillation   . Pulmonary hypertension   . Lower extremity edema     Past Surgical History  Procedure Laterality Date  . Oophorectomy  1961    left  . Breast lumpectomy  1073    left  . Cataract extraction  2008    bilateral  . Cardiac catheterization  06/13/2010    R & LHC: no CAD  . US echocardiography  02/04/2012    LA mod. dilated,RA mild-mod dilated,mild MR,mod. TR    Family History  Problem Relation Age of Onset  . Heart attack Mother   . Heart attack Father   . Diabetes Brother   . Cancer Brother     History   Social History  . Marital Status: Widowed    Spouse Name: N/A    Number of Children: N/A  . Years of Education: N/A  Occupational History  . Not on file.   Social History Main Topics  . Smoking status: Never Smoker   . Smokeless tobacco: Never Used  . Alcohol Use: Yes     Comment: seldom; 03/17/2013-occasionally drinks a glass of wine before bed  . Drug Use: No  . Sexual Activity: Not on file   Other Topics Concern  . Not on file   Social History Narrative  . No narrative on file    Review of systems: The patient specifically denies any chest pain at rest or with exertion, dyspnea at rest, orthopnea, paroxysmal nocturnal dyspnea, syncope, palpitations, focal neurological deficits, intermittent claudication,unexplained weight gain, cough, hemoptysis or wheezing.  The patient also denies abdominal pain, nausea, vomiting, dysphagia,  diarrhea, constipation, polyuria, polydipsia, dysuria, hematuria, frequency, urgency, abnormal bleeding or bruising, fever, chills, unexpected weight changes, mood swings, change in skin or hair texture, change in voice quality, auditory or visual problems, allergic reactions or rashes, new musculoskeletal complaints other than usual "aches and pains".      PHYSICAL EXAM BP 134/84  Pulse 118  Resp 16  Ht 5\' 2"  (1.575 m)  Wt 159 lb 6.4 oz (72.303 kg)  BMI 29.15 kg/m2 General: Alert, oriented x3, no distress  Head: no evidence of trauma, PERRL, EOMI, no exophtalmos or lid lag, no myxedema, no xanthelasma; normal ears, nose and oropharynx  Neck: Marked Kussmaul's sign, with flat jugular veins during expiration and 6-7 cm elevation in jugular venous pulsations during inspiration and no hepatojugular reflux; brisk carotid pulses without delay and no carotid bruits  Chest: clear to auscultation, no signs of consolidation by percussion or palpation, normal fremitus, symmetrical and full respiratory excursions; she has moderate to severe thoracic kyphoscoliosis  Cardiovascular: normal position and quality of the apical impulse, irregular rhythm, normal first and second heart sounds, no rubs or gallops, grade 2/6 holosystolic murmur at the left lower sternal border  Abdomen: no tenderness or distention, no masses by palpation, no abnormal pulsatility or arterial bruits, normal bowel sounds, no hepatosplenomegaly  Extremities: no clubbing, cyanosis or edema; 2+ radial, ulnar and brachial pulses bilaterally; 2+ right femoral, posterior tibial and dorsalis pedis pulses; 2+ left femoral, posterior tibial and dorsalis pedis pulses; no subclavian or femoral bruits  Neurological: grossly nonfocal    EKG: AF RVR  BMET    Component Value Date/Time   NA 144 02/05/2014 1210   K 3.8 02/05/2014 1210   CL 104 02/05/2014 1210   CO2 27 02/05/2014 1210   GLUCOSE 114* 02/05/2014 1210   BUN 18 02/05/2014 1210    CREATININE 0.79 02/05/2014 1210   CALCIUM 9.1 02/05/2014 1210   GFRNONAA 74* 02/05/2014 1210   GFRAA 86* 02/05/2014 1210     ASSESSMENT AND PLAN  ATRIAL FIBRILLATION  Ventricular rate control is poorat almost 120 beats per minute. She needs a total of 100 mg metoprolol daily and she seems to prefer the divided twice daily dose. Change Rx appropriately. She is therapeutically anticoagulated  PULMONARY HYPERTENSION, SECONDARY  Suspect this is related to restrictive lung disease. Constrictive pericarditis is considered but even if this diagnosis was confirmed, she would not be a good candidate for surgery. Continue observation.   Orders Placed This Encounter  Procedures  . EKG 12-Lead   Meds ordered this encounter  Medications  . metoprolol succinate (TOPROL-XL) 100 MG 24 hr tablet    Sig: Take 1/2 tablet in morning and 1/2 tablet in the evening    Dispense:  30 tablet  Refill:  [address], [place], [facility] [phone] office [phone] pager

## 2014-06-29 ENCOUNTER — Other Ambulatory Visit: Payer: Self-pay | Admitting: Endocrinology

## 2014-06-29 DIAGNOSIS — Z1231 Encounter for screening mammogram for malignant neoplasm of breast: Secondary | ICD-10-CM

## 2014-07-19 ENCOUNTER — Ambulatory Visit
Admission: RE | Admit: 2014-07-19 | Discharge: 2014-07-19 | Disposition: A | Discharge status: Home / Self Care | Payer: Medicare HMO | Source: Ambulatory Visit | Attending: Endocrinology | Admitting: Endocrinology

## 2014-07-19 DIAGNOSIS — Z1231 Encounter for screening mammogram for malignant neoplasm of breast: Secondary | ICD-10-CM

## 2014-09-09 ENCOUNTER — Ambulatory Visit: Payer: Medicare HMO | Admitting: Cardiovascular Disease

## 2014-10-14 ENCOUNTER — Ambulatory Visit: Payer: Medicare HMO | Admitting: Cardiovascular Disease

## 2014-11-21 ENCOUNTER — Encounter: Payer: Self-pay | Admitting: Cardiovascular Disease

## 2014-11-21 ENCOUNTER — Telehealth: Payer: Self-pay | Admitting: Cardiovascular Disease

## 2014-11-23 NOTE — Telephone Encounter (Signed)
Closed encounter °

## 2014-12-02 ENCOUNTER — Ambulatory Visit: Payer: Medicare HMO | Admitting: Cardiovascular Disease

## 2014-12-29 ENCOUNTER — Ambulatory Visit: Payer: Medicare HMO | Admitting: Cardiovascular Disease

## 2015-02-09 NOTE — Progress Notes (Signed)
Patient ID: Wendy Flowers, female   DOB: [birth year], 81 y.o.   MRN: [id number]     Cardiology Office Note   Date:  02/10/2015   ID:  Wendy Flowers, DOB 79/11/1926, MRN [id number]  PCP:  Dwan Bolt, MD  Cardiologist:   Sanda Klein, MD   Chief Complaint  Patient presents with  . Follow-up    no chest discomfort,some swelling but no pain in legs. not sleeping well and a little short breath when she bends. no other concerns       History of Present Illness: Wendy Flowers is a 81 y.o. female who presents for follow up of atrial fibrillation and pulmonary HTN.  There has been no change in a pattern of chronic mild shortness of breath. She mostly complains of dyspnea when bending over. She denies palpitations and is unaware of her arrhythmia. Usually atrial fibrillation rate control is adequate, but today her resting heart rate is 107 bpm.  Denies other cardiovascular complaints.  No bleeding or neurological problems.  Her dose of thyroid supplement has recently been decreased due to excessive suppression of TSH.  She has mild to moderate pulmonary hypertension confirmed by right heart catheterization (PA pressure 49/20, mean 30, PA wedge pressure mean 16 mm Hg), but this appears to be stable in severity and is not clearly explained. The most likely cause is restrictive lung disease from fairly significant thoracic kyphoscoliosis in turn related to osteoporosis. She does not have a history of smoking does not have signs or symptoms of obstructive sleep apnea and has never had known venous thromboembolic events. A summary workup for inflammatory/autoimmune disorders in the past has been unrevealing. Her forced vital capacity is roughly 62% of predicted and does not change with bronchodilator therapy. No mention is made of a pericardial abnormalities on the CT of the chest from 2011.     Past Medical History  Diagnosis Date  . Permanent atrial fibrillation   . Pulmonary hypertension   . Lower  extremity edema     Past Surgical History  Procedure Laterality Date  . Oophorectomy  1961    left  . Breast lumpectomy  1073    left  . Cataract extraction  2008    bilateral  . Cardiac catheterization  06/13/2010    R & LHC: no CAD  . US echocardiography  02/04/2012    LA mod. dilated,RA mild-mod dilated,mild MR,mod. TR     Current Outpatient Prescriptions  Medication Sig Dispense Refill  . Calcium Carb-Cholecalciferol (CALCIUM + D3) 600-200 MG-UNIT TABS Take 1 tablet by mouth 2 (two) times daily.    . furosemide (LASIX) 40 MG tablet Take 40 mg by mouth.    . levothyroxine (SYNTHROID, LEVOTHROID) 125 MCG tablet Take 112 mcg by mouth daily before breakfast.     . metoprolol succinate (TOPROL-XL) 100 MG 24 hr tablet Take 1/2 tablet in morning and 1/2 tablet in the evening 30 tablet 11  . PROLIA 60 MG/ML SOLN injection Inject 60 mg into the skin every 6 (six) months.    . simvastatin (ZOCOR) 40 MG tablet Take 40 mg by mouth every evening.    . warfarin (COUMADIN) 4 MG tablet Take 2-4 mg by mouth every morning. On Monday and Thursday 4mg .  All other days take 2mg      No current facility-administered medications for this visit.    Allergies:   Foradil    Social History:  The patient  reports that she has never smoked. She has  never used smokeless tobacco. She reports that she drinks alcohol. She reports that she does not use illicit drugs.   Family History:  The patient's family history includes Cancer in her brother; Diabetes in her brother; Heart attack in her father and mother.    ROS:  Please see the history of present illness.    Otherwise, review of systems positive for none.   All other systems are reviewed and negative.    PHYSICAL EXAM: VS:  BP 100/60 mmHg  Pulse 107  Ht 5' 4.5" (1.638 m)  Wt 152 lb 3.2 oz (69.037 kg)  BMI 25.73 kg/m2 , BMI Body mass index is 25.73 kg/(m^2).  General: Alert, oriented x3, no distress Head: no evidence of trauma, PERRL, EOMI, no  exophtalmos or lid lag, no myxedema, no xanthelasma; normal ears, nose and oropharynx Neck: normal jugular venous pulsations and no hepatojugular reflux; brisk carotid pulses without delay and no carotid bruits Chest: clear to auscultation, no signs of consolidation by percussion or palpation, normal fremitus, symmetrical and full respiratory excursions Cardiovascular: normal position and quality of the apical impulse, irregular rhythm, normal first and second heart sounds, no   murmurs, rubs or gallops Abdomen: no tenderness or distention, no masses by palpation, no abnormal pulsatility or arterial bruits, normal bowel sounds, no hepatosplenomegaly Extremities: no clubbing, cyanosis or edema; 2+ radial, ulnar and brachial pulses bilaterally; 2+ right femoral, posterior tibial and dorsalis pedis pulses; 2+ left femoral, posterior tibial and dorsalis pedis pulses; no subclavian or femoral bruits Neurological: grossly nonfocal Psych: euthymic mood, full affect   EKG:  EKG is ordered today. The ekg ordered today demonstrates atrial fibrillation with rapid ventricular response 107 bpm   Recent Labs: No results found for requested labs within last 365 days.    Lipid Panel No results found for: CHOL, TRIG, HDL, CHOLHDL, VLDL, LDLCALC, LDLDIRECT    Wt Readings from Last 3 Encounters:  02/10/15 152 lb 3.2 oz (69.037 kg)  06/01/14 159 lb 6.4 oz (72.303 kg)  02/28/14 152 lb 11.2 oz (69.264 kg)      ASSESSMENT AND PLAN:  ATRIAL FIBRILLATION  Ventricular rate control is poor. She takes a total of 100 mg metoprolol daily. She is therapeutically anticoagulated.  She just had repeat thyroid studies performed but does not yet have the results. She is due to see Dr. Wilson Singer on Wednesday. If her thyroid hormone is still excessive , this could explain the rapid rate. I'm reluctant to increase her beta blocker dose since her blood pressure is borderline low. We'll ask Dr. Wilson Singer to follow-up on her  ventricular rate at the upcoming appointment next week.  If heart rate is still greater than 100 I will add a very low dose of digoxin.   PULMONARY HYPERTENSION, SECONDARY  Suspect this is related to restrictive lung disease. Continue observation.    Current medicines are reviewed at length with the patient today.  The patient does not have concerns regarding medicines.  The following changes have been made:  no change  Labs/ tests ordered today include:  No orders of the defined types were placed in this encounter.     Patient Instructions  When you see Dr. Wilson Singer please have them do an EKG and fax this to [place] at [phone] (we are interested in an accurate reading of your heart rate).  Dr. Sallyanne Kuster recommends that you schedule a follow-up appointment in: 6 months.        Mikael Spray, MD  02/10/2015 2:01 PM  Sanda Klein, MD, [facility] [phone] office [phone] pager

## 2015-02-10 ENCOUNTER — Encounter: Payer: Self-pay | Admitting: Cardiovascular Disease

## 2015-02-10 ENCOUNTER — Ambulatory Visit (INDEPENDENT_AMBULATORY_CARE_PROVIDER_SITE_OTHER): Payer: Medicare HMO | Admitting: Cardiovascular Disease

## 2015-02-10 VITALS — BP 100/60 | HR 107 | Ht 64.5 in | Wt 152.2 lb

## 2015-02-10 DIAGNOSIS — I272 Other secondary pulmonary hypertension: Secondary | ICD-10-CM

## 2015-02-10 DIAGNOSIS — I10 Essential (primary) hypertension: Secondary | ICD-10-CM

## 2015-02-10 DIAGNOSIS — Z7901 Long term (current) use of anticoagulants: Secondary | ICD-10-CM | POA: Diagnosis not present

## 2015-02-10 DIAGNOSIS — I2721 Secondary pulmonary arterial hypertension: Secondary | ICD-10-CM

## 2015-02-10 DIAGNOSIS — I4821 Permanent atrial fibrillation: Secondary | ICD-10-CM

## 2015-02-10 DIAGNOSIS — I482 Chronic atrial fibrillation: Secondary | ICD-10-CM

## 2015-02-10 NOTE — Patient Instructions (Signed)
When you see Dr. Wilson Singer please have them do an EKG and fax this to [place] at [phone] (we are interested in an accurate reading of your heart rate).  Dr. Sallyanne Kuster recommends that you schedule a follow-up appointment in: 6 months.

## 2015-02-22 ENCOUNTER — Encounter: Payer: Self-pay | Admitting: Cardiovascular Disease

## 2015-02-22 ENCOUNTER — Telehealth: Payer: Self-pay | Admitting: Cardiovascular Disease

## 2015-02-22 NOTE — Telephone Encounter (Signed)
Pt c/o medication issue:  1. Name of Medication: Coumadin   2. How are you currently taking this medication (dosage and times per day)? Pt is unsure  3. Are you having a reaction (difficulty breathing--STAT)? No  4. What is your medication issue? Pt is needing some clarification on dosage and how to take this medication

## 2015-02-22 NOTE — Telephone Encounter (Signed)
She needs to have Dr. Wilson Singer refill warfarin since he is the one dosing it.

## 2015-02-22 NOTE — Telephone Encounter (Signed)
Spoke w/ patient.  She reports compliance w/ coumadin as reported at recent OV w/ Dr. Sallyanne Kuster - most recent dosage is 4mg  Mon and Thurs, 2mg  all other days.  She reports taking this dose, except she is doing the 4mg  on Tuesdays and Fridays instead of Mondays and Thursdays. Advised this should be fine.   Note she gets her coumadin levels checked at Dr. Eugenio Hoes office - advised her to check w/ them to see if any recent adjustments or near future checks recommended. Pt verbalized understanding.  Routing to to [facility] for any additional concerns.

## 2015-06-10 ENCOUNTER — Other Ambulatory Visit: Payer: Self-pay | Admitting: Cardiovascular Disease

## 2015-06-13 DIAGNOSIS — I4891 Unspecified atrial fibrillation: Secondary | ICD-10-CM | POA: Diagnosis not present

## 2015-06-13 DIAGNOSIS — Z7901 Long term (current) use of anticoagulants: Secondary | ICD-10-CM | POA: Diagnosis not present

## 2015-06-26 DIAGNOSIS — Z7901 Long term (current) use of anticoagulants: Secondary | ICD-10-CM | POA: Diagnosis not present

## 2015-06-26 DIAGNOSIS — E0789 Other specified disorders of thyroid: Secondary | ICD-10-CM | POA: Diagnosis not present

## 2015-06-26 DIAGNOSIS — E032 Hypothyroidism due to medicaments and other exogenous substances: Secondary | ICD-10-CM | POA: Diagnosis not present

## 2015-06-26 DIAGNOSIS — E789 Disorder of lipoprotein metabolism, unspecified: Secondary | ICD-10-CM | POA: Diagnosis not present

## 2015-07-12 DIAGNOSIS — N390 Urinary tract infection, site not specified: Secondary | ICD-10-CM | POA: Diagnosis not present

## 2015-08-01 DIAGNOSIS — I4891 Unspecified atrial fibrillation: Secondary | ICD-10-CM | POA: Diagnosis not present

## 2015-08-01 DIAGNOSIS — Z7901 Long term (current) use of anticoagulants: Secondary | ICD-10-CM | POA: Diagnosis not present

## 2015-09-07 DIAGNOSIS — Z7901 Long term (current) use of anticoagulants: Secondary | ICD-10-CM | POA: Diagnosis not present

## 2015-09-07 DIAGNOSIS — I4891 Unspecified atrial fibrillation: Secondary | ICD-10-CM | POA: Diagnosis not present

## 2015-10-09 DIAGNOSIS — Z7901 Long term (current) use of anticoagulants: Secondary | ICD-10-CM | POA: Diagnosis not present

## 2015-10-09 DIAGNOSIS — I4891 Unspecified atrial fibrillation: Secondary | ICD-10-CM | POA: Diagnosis not present

## 2015-11-07 DIAGNOSIS — I4891 Unspecified atrial fibrillation: Secondary | ICD-10-CM | POA: Diagnosis not present

## 2015-11-07 DIAGNOSIS — Z7901 Long term (current) use of anticoagulants: Secondary | ICD-10-CM | POA: Diagnosis not present

## 2015-12-07 DIAGNOSIS — Z7901 Long term (current) use of anticoagulants: Secondary | ICD-10-CM | POA: Diagnosis not present

## 2015-12-07 DIAGNOSIS — I4891 Unspecified atrial fibrillation: Secondary | ICD-10-CM | POA: Diagnosis not present

## 2015-12-07 DIAGNOSIS — Z79899 Other long term (current) drug therapy: Secondary | ICD-10-CM | POA: Diagnosis not present

## 2015-12-21 DIAGNOSIS — Z0000 Encounter for general adult medical examination without abnormal findings: Secondary | ICD-10-CM | POA: Diagnosis not present

## 2015-12-21 DIAGNOSIS — E039 Hypothyroidism, unspecified: Secondary | ICD-10-CM | POA: Diagnosis not present

## 2015-12-21 DIAGNOSIS — E7800 Pure hypercholesterolemia, unspecified: Secondary | ICD-10-CM | POA: Diagnosis not present

## 2015-12-21 DIAGNOSIS — I10 Essential (primary) hypertension: Secondary | ICD-10-CM | POA: Diagnosis not present

## 2015-12-21 DIAGNOSIS — I481 Persistent atrial fibrillation: Secondary | ICD-10-CM | POA: Diagnosis not present

## 2015-12-25 ENCOUNTER — Other Ambulatory Visit: Payer: Self-pay | Admitting: Cardiovascular Disease

## 2015-12-25 NOTE — Telephone Encounter (Signed)
Rx request sent to pharmacy.  

## 2016-01-11 DIAGNOSIS — Z7901 Long term (current) use of anticoagulants: Secondary | ICD-10-CM | POA: Diagnosis not present

## 2016-01-11 DIAGNOSIS — I4891 Unspecified atrial fibrillation: Secondary | ICD-10-CM | POA: Diagnosis not present

## 2016-01-30 ENCOUNTER — Other Ambulatory Visit: Payer: Self-pay | Admitting: Cardiovascular Disease

## 2016-01-30 NOTE — Telephone Encounter (Signed)
Rx has been sent to the pharmacy electronically. ° °

## 2016-02-19 DIAGNOSIS — I4891 Unspecified atrial fibrillation: Secondary | ICD-10-CM | POA: Diagnosis not present

## 2016-02-19 DIAGNOSIS — Z7901 Long term (current) use of anticoagulants: Secondary | ICD-10-CM | POA: Diagnosis not present

## 2016-02-26 DIAGNOSIS — I4891 Unspecified atrial fibrillation: Secondary | ICD-10-CM | POA: Diagnosis not present

## 2016-02-26 DIAGNOSIS — Z7901 Long term (current) use of anticoagulants: Secondary | ICD-10-CM | POA: Diagnosis not present

## 2016-02-28 DIAGNOSIS — G4700 Insomnia, unspecified: Secondary | ICD-10-CM | POA: Diagnosis not present

## 2016-02-28 DIAGNOSIS — E032 Hypothyroidism due to medicaments and other exogenous substances: Secondary | ICD-10-CM | POA: Diagnosis not present

## 2016-02-28 DIAGNOSIS — I482 Chronic atrial fibrillation: Secondary | ICD-10-CM | POA: Diagnosis not present

## 2016-02-28 DIAGNOSIS — E789 Disorder of lipoprotein metabolism, unspecified: Secondary | ICD-10-CM | POA: Diagnosis not present

## 2016-03-28 DIAGNOSIS — I4891 Unspecified atrial fibrillation: Secondary | ICD-10-CM | POA: Diagnosis not present

## 2016-03-28 DIAGNOSIS — Z7901 Long term (current) use of anticoagulants: Secondary | ICD-10-CM | POA: Diagnosis not present

## 2016-04-11 DIAGNOSIS — I4891 Unspecified atrial fibrillation: Secondary | ICD-10-CM | POA: Diagnosis not present

## 2016-04-11 DIAGNOSIS — Z7901 Long term (current) use of anticoagulants: Secondary | ICD-10-CM | POA: Diagnosis not present

## 2016-05-07 DIAGNOSIS — I4891 Unspecified atrial fibrillation: Secondary | ICD-10-CM | POA: Diagnosis not present

## 2016-05-07 DIAGNOSIS — Z7901 Long term (current) use of anticoagulants: Secondary | ICD-10-CM | POA: Diagnosis not present

## 2016-05-07 DIAGNOSIS — Z23 Encounter for immunization: Secondary | ICD-10-CM | POA: Diagnosis not present

## 2016-05-23 DIAGNOSIS — I4891 Unspecified atrial fibrillation: Secondary | ICD-10-CM | POA: Diagnosis not present

## 2016-05-23 DIAGNOSIS — E789 Disorder of lipoprotein metabolism, unspecified: Secondary | ICD-10-CM | POA: Diagnosis not present

## 2016-05-23 DIAGNOSIS — E0789 Other specified disorders of thyroid: Secondary | ICD-10-CM | POA: Diagnosis not present

## 2016-05-29 DIAGNOSIS — E789 Disorder of lipoprotein metabolism, unspecified: Secondary | ICD-10-CM | POA: Diagnosis not present

## 2016-05-29 DIAGNOSIS — E032 Hypothyroidism due to medicaments and other exogenous substances: Secondary | ICD-10-CM | POA: Diagnosis not present

## 2016-05-29 DIAGNOSIS — I4891 Unspecified atrial fibrillation: Secondary | ICD-10-CM | POA: Diagnosis not present

## 2016-06-13 DIAGNOSIS — I4891 Unspecified atrial fibrillation: Secondary | ICD-10-CM | POA: Diagnosis not present

## 2016-06-13 DIAGNOSIS — Z7901 Long term (current) use of anticoagulants: Secondary | ICD-10-CM | POA: Diagnosis not present

## 2016-07-15 DIAGNOSIS — I4891 Unspecified atrial fibrillation: Secondary | ICD-10-CM | POA: Diagnosis not present

## 2016-07-15 DIAGNOSIS — Z7901 Long term (current) use of anticoagulants: Secondary | ICD-10-CM | POA: Diagnosis not present

## 2016-07-29 DIAGNOSIS — E0789 Other specified disorders of thyroid: Secondary | ICD-10-CM | POA: Diagnosis not present

## 2016-07-31 DIAGNOSIS — E032 Hypothyroidism due to medicaments and other exogenous substances: Secondary | ICD-10-CM | POA: Diagnosis not present

## 2016-07-31 DIAGNOSIS — E789 Disorder of lipoprotein metabolism, unspecified: Secondary | ICD-10-CM | POA: Diagnosis not present

## 2016-07-31 DIAGNOSIS — M810 Age-related osteoporosis without current pathological fracture: Secondary | ICD-10-CM | POA: Diagnosis not present

## 2016-07-31 DIAGNOSIS — I10 Essential (primary) hypertension: Secondary | ICD-10-CM | POA: Diagnosis not present

## 2016-08-22 DIAGNOSIS — Z7901 Long term (current) use of anticoagulants: Secondary | ICD-10-CM | POA: Diagnosis not present

## 2016-08-22 DIAGNOSIS — I4891 Unspecified atrial fibrillation: Secondary | ICD-10-CM | POA: Diagnosis not present

## 2016-09-19 DIAGNOSIS — I4891 Unspecified atrial fibrillation: Secondary | ICD-10-CM | POA: Diagnosis not present

## 2016-09-19 DIAGNOSIS — Z7901 Long term (current) use of anticoagulants: Secondary | ICD-10-CM | POA: Diagnosis not present

## 2016-09-27 DIAGNOSIS — Z0000 Encounter for general adult medical examination without abnormal findings: Secondary | ICD-10-CM | POA: Diagnosis not present

## 2016-09-27 DIAGNOSIS — I481 Persistent atrial fibrillation: Secondary | ICD-10-CM | POA: Diagnosis not present

## 2016-09-27 DIAGNOSIS — K08409 Partial loss of teeth, unspecified cause, unspecified class: Secondary | ICD-10-CM | POA: Diagnosis not present

## 2016-09-27 DIAGNOSIS — E7800 Pure hypercholesterolemia, unspecified: Secondary | ICD-10-CM | POA: Diagnosis not present

## 2016-09-27 DIAGNOSIS — I10 Essential (primary) hypertension: Secondary | ICD-10-CM | POA: Diagnosis not present

## 2016-09-27 DIAGNOSIS — G479 Sleep disorder, unspecified: Secondary | ICD-10-CM | POA: Diagnosis not present

## 2016-09-27 DIAGNOSIS — Z972 Presence of dental prosthetic device (complete) (partial): Secondary | ICD-10-CM | POA: Diagnosis not present

## 2016-09-27 DIAGNOSIS — I2721 Secondary pulmonary arterial hypertension: Secondary | ICD-10-CM | POA: Diagnosis not present

## 2016-09-27 DIAGNOSIS — R69 Illness, unspecified: Secondary | ICD-10-CM | POA: Diagnosis not present

## 2016-09-27 DIAGNOSIS — G3184 Mild cognitive impairment, so stated: Secondary | ICD-10-CM | POA: Diagnosis not present

## 2016-09-27 DIAGNOSIS — E039 Hypothyroidism, unspecified: Secondary | ICD-10-CM | POA: Diagnosis not present

## 2016-10-17 DIAGNOSIS — Z7901 Long term (current) use of anticoagulants: Secondary | ICD-10-CM | POA: Diagnosis not present

## 2016-10-17 DIAGNOSIS — I4891 Unspecified atrial fibrillation: Secondary | ICD-10-CM | POA: Diagnosis not present

## 2016-10-29 DIAGNOSIS — E0789 Other specified disorders of thyroid: Secondary | ICD-10-CM | POA: Diagnosis not present

## 2016-11-05 DIAGNOSIS — E032 Hypothyroidism due to medicaments and other exogenous substances: Secondary | ICD-10-CM | POA: Diagnosis not present

## 2016-11-05 DIAGNOSIS — I482 Chronic atrial fibrillation: Secondary | ICD-10-CM | POA: Diagnosis not present

## 2016-11-05 DIAGNOSIS — E789 Disorder of lipoprotein metabolism, unspecified: Secondary | ICD-10-CM | POA: Diagnosis not present

## 2016-11-14 DIAGNOSIS — I4891 Unspecified atrial fibrillation: Secondary | ICD-10-CM | POA: Diagnosis not present

## 2016-11-14 DIAGNOSIS — Z7901 Long term (current) use of anticoagulants: Secondary | ICD-10-CM | POA: Diagnosis not present

## 2016-12-12 DIAGNOSIS — Z7901 Long term (current) use of anticoagulants: Secondary | ICD-10-CM | POA: Diagnosis not present

## 2016-12-12 DIAGNOSIS — I4891 Unspecified atrial fibrillation: Secondary | ICD-10-CM | POA: Diagnosis not present

## 2016-12-25 DIAGNOSIS — E032 Hypothyroidism due to medicaments and other exogenous substances: Secondary | ICD-10-CM | POA: Diagnosis not present

## 2016-12-25 DIAGNOSIS — E789 Disorder of lipoprotein metabolism, unspecified: Secondary | ICD-10-CM | POA: Diagnosis not present

## 2017-01-02 DIAGNOSIS — I4891 Unspecified atrial fibrillation: Secondary | ICD-10-CM | POA: Diagnosis not present

## 2017-01-02 DIAGNOSIS — E039 Hypothyroidism, unspecified: Secondary | ICD-10-CM | POA: Diagnosis not present

## 2017-01-09 DIAGNOSIS — I4891 Unspecified atrial fibrillation: Secondary | ICD-10-CM | POA: Diagnosis not present

## 2017-01-09 DIAGNOSIS — Z7901 Long term (current) use of anticoagulants: Secondary | ICD-10-CM | POA: Diagnosis not present

## 2017-01-17 DIAGNOSIS — E0789 Other specified disorders of thyroid: Secondary | ICD-10-CM | POA: Diagnosis not present

## 2017-02-06 DIAGNOSIS — I4891 Unspecified atrial fibrillation: Secondary | ICD-10-CM | POA: Diagnosis not present

## 2017-02-06 DIAGNOSIS — Z7901 Long term (current) use of anticoagulants: Secondary | ICD-10-CM | POA: Diagnosis not present

## 2017-02-18 DIAGNOSIS — Z7901 Long term (current) use of anticoagulants: Secondary | ICD-10-CM | POA: Diagnosis not present

## 2017-02-18 DIAGNOSIS — I4891 Unspecified atrial fibrillation: Secondary | ICD-10-CM | POA: Diagnosis not present

## 2017-03-27 DIAGNOSIS — I4891 Unspecified atrial fibrillation: Secondary | ICD-10-CM | POA: Diagnosis not present

## 2017-03-27 DIAGNOSIS — Z7901 Long term (current) use of anticoagulants: Secondary | ICD-10-CM | POA: Diagnosis not present

## 2017-04-10 DIAGNOSIS — Z7901 Long term (current) use of anticoagulants: Secondary | ICD-10-CM | POA: Diagnosis not present

## 2017-04-10 DIAGNOSIS — I4891 Unspecified atrial fibrillation: Secondary | ICD-10-CM | POA: Diagnosis not present

## 2017-05-13 ENCOUNTER — Encounter (HOSPITAL_COMMUNITY): Payer: Self-pay | Admitting: Emergency Medicine

## 2017-05-13 ENCOUNTER — Emergency Department (HOSPITAL_COMMUNITY): Payer: Medicare HMO

## 2017-05-13 ENCOUNTER — Observation Stay (HOSPITAL_COMMUNITY)
Admission: EM | Admit: 2017-05-13 | Discharge: 2017-05-16 | Disposition: A | Discharge status: Skilled Nursing Facility | Payer: Medicare HMO | Attending: Internal Medicine | Admitting: Internal Medicine

## 2017-05-13 DIAGNOSIS — Z9181 History of falling: Secondary | ICD-10-CM | POA: Insufficient documentation

## 2017-05-13 DIAGNOSIS — D329 Benign neoplasm of meninges, unspecified: Secondary | ICD-10-CM

## 2017-05-13 DIAGNOSIS — E785 Hyperlipidemia, unspecified: Secondary | ICD-10-CM | POA: Diagnosis not present

## 2017-05-13 DIAGNOSIS — D320 Benign neoplasm of cerebral meninges: Secondary | ICD-10-CM | POA: Insufficient documentation

## 2017-05-13 DIAGNOSIS — I110 Hypertensive heart disease with heart failure: Secondary | ICD-10-CM | POA: Diagnosis not present

## 2017-05-13 DIAGNOSIS — Z23 Encounter for immunization: Secondary | ICD-10-CM | POA: Diagnosis not present

## 2017-05-13 DIAGNOSIS — I482 Chronic atrial fibrillation, unspecified: Secondary | ICD-10-CM

## 2017-05-13 DIAGNOSIS — S32010A Wedge compression fracture of first lumbar vertebra, initial encounter for closed fracture: Secondary | ICD-10-CM | POA: Diagnosis not present

## 2017-05-13 DIAGNOSIS — M549 Dorsalgia, unspecified: Secondary | ICD-10-CM | POA: Diagnosis not present

## 2017-05-13 DIAGNOSIS — S32019A Unspecified fracture of first lumbar vertebra, initial encounter for closed fracture: Principal | ICD-10-CM | POA: Insufficient documentation

## 2017-05-13 DIAGNOSIS — S32029A Unspecified fracture of second lumbar vertebra, initial encounter for closed fracture: Secondary | ICD-10-CM | POA: Insufficient documentation

## 2017-05-13 DIAGNOSIS — Z7901 Long term (current) use of anticoagulants: Secondary | ICD-10-CM | POA: Diagnosis not present

## 2017-05-13 DIAGNOSIS — I10 Essential (primary) hypertension: Secondary | ICD-10-CM | POA: Diagnosis not present

## 2017-05-13 DIAGNOSIS — I272 Pulmonary hypertension, unspecified: Secondary | ICD-10-CM | POA: Insufficient documentation

## 2017-05-13 DIAGNOSIS — T148XXA Other injury of unspecified body region, initial encounter: Secondary | ICD-10-CM | POA: Diagnosis not present

## 2017-05-13 DIAGNOSIS — E876 Hypokalemia: Secondary | ICD-10-CM | POA: Insufficient documentation

## 2017-05-13 DIAGNOSIS — M546 Pain in thoracic spine: Secondary | ICD-10-CM | POA: Diagnosis not present

## 2017-05-13 DIAGNOSIS — S32000A Wedge compression fracture of unspecified lumbar vertebra, initial encounter for closed fracture: Secondary | ICD-10-CM | POA: Diagnosis present

## 2017-05-13 DIAGNOSIS — E039 Hypothyroidism, unspecified: Secondary | ICD-10-CM | POA: Insufficient documentation

## 2017-05-13 DIAGNOSIS — I5022 Chronic systolic (congestive) heart failure: Secondary | ICD-10-CM | POA: Diagnosis not present

## 2017-05-13 DIAGNOSIS — S199XXA Unspecified injury of neck, initial encounter: Secondary | ICD-10-CM | POA: Diagnosis not present

## 2017-05-13 DIAGNOSIS — Z79899 Other long term (current) drug therapy: Secondary | ICD-10-CM | POA: Diagnosis not present

## 2017-05-13 DIAGNOSIS — I4821 Permanent atrial fibrillation: Secondary | ICD-10-CM | POA: Diagnosis present

## 2017-05-13 DIAGNOSIS — M542 Cervicalgia: Secondary | ICD-10-CM | POA: Diagnosis not present

## 2017-05-13 DIAGNOSIS — S0990XA Unspecified injury of head, initial encounter: Secondary | ICD-10-CM

## 2017-05-13 DIAGNOSIS — W19XXXA Unspecified fall, initial encounter: Secondary | ICD-10-CM

## 2017-05-13 DIAGNOSIS — I2721 Secondary pulmonary arterial hypertension: Secondary | ICD-10-CM | POA: Diagnosis present

## 2017-05-13 DIAGNOSIS — R791 Abnormal coagulation profile: Secondary | ICD-10-CM | POA: Diagnosis not present

## 2017-05-13 DIAGNOSIS — W06XXXA Fall from bed, initial encounter: Secondary | ICD-10-CM | POA: Diagnosis not present

## 2017-05-13 LAB — BASIC METABOLIC PANEL
ANION GAP: 6 (ref 5–15)
BUN: 19 mg/dL (ref 6–20)
CO2: 28 mmol/L (ref 22–32)
Calcium: 9.6 mg/dL (ref 8.9–10.3)
Chloride: 103 mmol/L (ref 101–111)
Creatinine, Ser: 0.96 mg/dL (ref 0.44–1.00)
GFR calc Af Amer: 59 mL/min — ABNORMAL LOW (ref >60)
GFR calc non Af Amer: 51 mL/min — ABNORMAL LOW (ref >60)
GLUCOSE: 94 mg/dL (ref 65–99)
Potassium: 3.7 mmol/L (ref 3.5–5.1)
Sodium: 137 mmol/L (ref 135–145)

## 2017-05-13 LAB — CBC WITH DIFFERENTIAL/PLATELET
Basophils Absolute: 0 10*3/uL (ref 0.0–0.1)
Basophils Relative: 1 %
Eosinophils Absolute: 0.1 10*3/uL (ref 0.0–0.7)
Eosinophils Relative: 1 %
HCT: 41.9 % (ref 36.0–46.0)
HEMOGLOBIN: 13.8 g/dL (ref 12.0–15.0)
Lymphocytes Relative: 15 %
Lymphs Abs: 0.8 10*3/uL (ref 0.7–4.0)
MCH: 29.2 pg (ref 26.0–34.0)
MCHC: 32.9 g/dL (ref 30.0–36.0)
MCV: 88.8 fL (ref 78.0–100.0)
MONO ABS: 0.4 10*3/uL (ref 0.1–1.0)
Monocytes Relative: 7 %
NEUTROS ABS: 4.3 10*3/uL (ref 1.7–7.7)
Neutrophils Relative %: 76 %
Platelets: 277 10*3/uL (ref 150–400)
RBC: 4.72 MIL/uL (ref 3.87–5.11)
RDW: 15.3 % (ref 11.5–15.5)
WBC: 5.7 10*3/uL (ref 4.0–10.5)

## 2017-05-13 LAB — PROTIME-INR
INR: 9.91
Prothrombin Time: 78.6 seconds — ABNORMAL HIGH (ref 11.4–15.2)

## 2017-05-13 MED ORDER — ZOLPIDEM TARTRATE 5 MG PO TABS: 5.0000 mg | ORAL_TABLET | Freq: Every evening | ORAL | Status: DC | PRN

## 2017-05-13 MED ORDER — SIMVASTATIN 40 MG PO TABS
40.0000 mg | ORAL_TABLET | Freq: Every evening | ORAL | Status: DC
  Administered 2017-05-13 – 2017-05-15 (×3): 40 mg via ORAL
  Filled 2017-05-13 (×3): qty 1

## 2017-05-13 MED ORDER — ACETAMINOPHEN 500 MG PO TABS
1000.0000 mg | ORAL_TABLET | Freq: Once | ORAL | Status: DC
  Administered 2017-05-13: 1000 mg via ORAL
  Filled 2017-05-13: qty 2

## 2017-05-13 MED ORDER — LEVOTHYROXINE SODIUM 137 MCG PO TABS
137.0000 ug | ORAL_TABLET | Freq: Every day | ORAL | Status: DC
  Administered 2017-05-14 – 2017-05-15 (×2): 137 ug via ORAL
  Filled 2017-05-13 (×3): qty 1

## 2017-05-13 MED ORDER — METOPROLOL SUCCINATE ER 50 MG PO TB24
50.0000 mg | ORAL_TABLET | Freq: Two times a day (BID) | ORAL | Status: DC
  Administered 2017-05-13 – 2017-05-16 (×6): 50 mg via ORAL
  Filled 2017-05-13 (×6): qty 1

## 2017-05-13 MED ORDER — PHYTONADIONE 5 MG PO TABS
5.0000 mg | ORAL_TABLET | Freq: Once | ORAL | Status: AC
  Administered 2017-05-13: 5 mg via ORAL
  Filled 2017-05-13 (×2): qty 1

## 2017-05-13 MED ORDER — ONDANSETRON HCL 4 MG/2ML IJ SOLN: 4.0000 mg | Freq: Four times a day (QID) | INTRAMUSCULAR | Status: DC | PRN

## 2017-05-13 MED ORDER — ACETAMINOPHEN 325 MG PO TABS: 650.0000 mg | ORAL_TABLET | Freq: Four times a day (QID) | ORAL | Status: DC | PRN

## 2017-05-13 MED ORDER — FUROSEMIDE 40 MG PO TABS
40.0000 mg | ORAL_TABLET | Freq: Every day | ORAL | Status: DC
  Administered 2017-05-13 – 2017-05-16 (×4): 40 mg via ORAL
  Filled 2017-05-13 (×4): qty 1

## 2017-05-13 MED ORDER — METOPROLOL TARTRATE 5 MG/5ML IV SOLN: 5.0000 mg | INTRAVENOUS | Status: DC | PRN

## 2017-05-13 MED ORDER — ONDANSETRON HCL 4 MG PO TABS: 4.0000 mg | ORAL_TABLET | Freq: Four times a day (QID) | ORAL | Status: DC | PRN

## 2017-05-13 MED ORDER — CALCIUM CARBONATE-VITAMIN D 500-200 MG-UNIT PO TABS
1.0000 | ORAL_TABLET | Freq: Two times a day (BID) | ORAL | Status: DC
  Administered 2017-05-14 – 2017-05-16 (×5): 1 via ORAL
  Filled 2017-05-13 (×6): qty 1

## 2017-05-13 MED ORDER — OXYCODONE-ACETAMINOPHEN 5-325 MG PO TABS
1.0000 | ORAL_TABLET | ORAL | Status: DC | PRN
  Administered 2017-05-13 – 2017-05-16 (×3): 1 via ORAL
  Filled 2017-05-13 (×3): qty 1

## 2017-05-13 MED ORDER — METOPROLOL SUCCINATE ER 50 MG PO TB24
50.0000 mg | ORAL_TABLET | Freq: Every day | ORAL | Status: DC
  Administered 2017-05-13: 50 mg via ORAL
  Filled 2017-05-13: qty 1

## 2017-05-13 MED ORDER — HYDRALAZINE HCL 20 MG/ML IJ SOLN: 5.0000 mg | INTRAMUSCULAR | Status: DC | PRN

## 2017-05-13 NOTE — Care Management (Signed)
CM consult reviewed. Patient is being admitted. Care Management will follow for discharge needs. Kahdijah Errickson RN CCM 

## 2017-05-13 NOTE — ED Notes (Signed)
EKG delayed because pt is in imaging.

## 2017-05-13 NOTE — ED Notes (Addendum)
Pt states she was getting up from bed (at home) and slid off the bed but did not "fall"

## 2017-05-13 NOTE — H&P (Addendum)
History and Physical    Wendy Flowers YQM:[id number] DOB: [birth year] DOA: 05/13/2017  Referring MD/NP/PA:   PCP: Anda Kraft, MD   Patient coming from:  The patient is coming from home.  At baseline, pt is independent for most of ADL.   Chief Complaint: lower back pain, fall  HPI: Wendy Flowers is a 83 y.o. female with medical history significant of hypertension, hyperlipidemia, hypothyroidism, pulmonary hypertension, atrial fibrillation on Coumadin, CHF with EF of 45-50%, memory loss, who presents with lower back pain and fall.  Per pt's nephew, patient has been having lower back pain for about 4 weeks which has worsened today. Her lower back pain is constant, moderate, sharp, nonradiating. Patient states that she slipped off bed today, but no LOC. She states that she has weird feeling in her neck. No unilateral weakness, numbness or tingling to extremities. No facial droop, slurred speech, vision change or hearing loss. Patient denies chest pain, shortness rest, cough, nausea, vomiting, diarrhea or abdominal pain. No symptoms of UTI.  ED Course: pt was found to have INR 9.91, WBC 5.7, electrolytes renal function okay, temperature normal, tachycardia with HR 100-120s, tachypnea, oxygen saturation 96% on room air. Patient is placed on telemetry bed for observation.  # CT-L spin showed:  1. Compression deformities at L1 and L2 are favored to be chronic. However, in the absence of prior studies for comparison, they remain strictly age indeterminate. 2. No spinal canal or neural foraminal stenosis. 3. Normal alignment of the lumbar spine.  # CT of head and neck showed: 1. Sequelae of chronic ischemic microangiopathy without acute intracranial abnormality. 2. Partially calcified left middle cranial fossa extra-axial mass, possibly meningioma, unchanged compared to 03/22/2013. 3. No acute fracture or static subluxation of the cervical spine.   Review of Systems:   General: no fevers, chills,  no body weight gain, has fatigue HEENT: no blurry vision, hearing changes or sore throat. Respiratory: no dyspnea, coughing, wheezing CV: no chest pain, no palpitations GI: no nausea, vomiting, abdominal pain, diarrhea, constipation GU: no dysuria, burning on urination, increased urinary frequency, hematuria  Ext: no leg edema Neuro: no unilateral weakness, numbness, or tingling, no vision change or hearing loss. Had fall. Skin: no rash, no skin tear. MSK: No muscle spasm, no deformity, no limitation of range of movement in spin Heme: No easy bruising.  Travel history: No recent long distant travel.  Allergy:  Allergies  Allergen Reactions  . Foradil [Formoterol] Other (See Comments)    Reaction not recalled (??)    Past Medical History:  Diagnosis Date  . Lower extremity edema   . Permanent atrial fibrillation (Wendy Flowers)   . Pulmonary hypertension ([place])     Past Surgical History:  Procedure Laterality Date  . BREAST LUMPECTOMY  5284   left  . CARDIAC CATHETERIZATION  06/13/2010   R & LHC: no CAD  . CATARACT EXTRACTION  2008   bilateral  . [place]   left  . US ECHOCARDIOGRAPHY  02/04/2012   LA mod. dilated,RA mild-mod dilated,mild MR,mod. TR    Social History:  reports that she has never smoked. She has never used smokeless tobacco. She reports that she drinks alcohol. She reports that she does not use drugs.  Family History:  Family History  Problem Relation Age of Onset  . Heart attack Mother   . Heart attack Father   . Diabetes Brother   . Cancer Brother      Prior to Admission medications  Medication Sig Start Date End Date Taking? Authorizing Provider  acetaminophen (TYLENOL) 325 MG tablet Take 325-650 mg by mouth every 6 (six) hours as needed (for lower back pain).   Yes [provider]  furosemide (LASIX) 40 MG tablet Take 40 mg by mouth daily.    Yes [provider]  metoprolol succinate (TOPROL-XL) 50 MG 24 hr tablet Take 50 mg  by mouth 2 (two) times daily. 03/28/17  Yes [provider]  simvastatin (ZOCOR) 40 MG tablet Take 40 mg by mouth every evening.   Yes [provider]  SYNTHROID 137 MCG tablet Take 137 mcg by mouth at bedtime.  03/27/17  Yes [provider]  Calcium Carb-Cholecalciferol (CALCIUM + D3) 600-200 MG-UNIT TABS Take 1 tablet by mouth 2 (two) times daily.    [provider]  metoprolol succinate (TOPROL-XL) 100 MG 24 hr tablet TAKE 1/2 TABLET BY MOUTH IN THE MORNING AND 1/2 TABLET IN THE EVENING Patient not taking: Reported on 05/13/2017 01/30/16   Croitoru, Mihai, MD  PROLIA 60 MG/ML SOLN injection Inject 60 mg into the skin every 6 (six) months. 12/08/13   [provider]  warfarin (COUMADIN) 4 MG tablet Take 2-4 mg by mouth See admin instructions. 2 mg in the morning on Sun/Tues/Wed/Fri/Sat and 4 mg on Mon/Thurs    [provider]    Physical Exam: Vitals:   05/13/17 2100 05/13/17 2115 05/13/17 2130 05/13/17 2212  BP: 112/87 119/76 117/80 108/72  Pulse: (!) 109 (!) 118 (!) 103 92  Resp: (!) 27 (!) 26 (!) 25 18  Temp:    98.9 F (37.2 C)  TempSrc:    Oral  SpO2: 94% 96% 94% 98%  Weight:    62.6 kg (138 lb)  Height:    5\' 4"  (1.626 m)   General: Not in acute distress HEENT:       Eyes: PERRL, EOMI, no scleral icterus.       ENT: No discharge from the ears and nose, no pharynx injection, no tonsillar enlargement.        Neck: No JVD, no bruit, no mass felt. Heme: No neck lymph node enlargement. Cardiac: S1/S2, RRR, No murmurs, No gallops or rubs. Respiratory: No rales, wheezing, rhonchi or rubs. GI: Soft, nondistended, nontender, no rebound pain, no organomegaly, BS present. GU: No hematuria Ext: No pitting leg edema bilaterally. 2+DP/PT pulse bilaterally. Musculoskeletal: No joint deformities, No joint redness or warmth, no limitation of ROM in spin. Skin: No rashes.  Neuro: Alert, oriented X3, cranial nerves II-XII grossly intact, moves  all extremities normally. Muscle strength 5/5 in all extremities, sensation to light touch intact. Brachial reflex 2+ bilaterally. Negative Babinski's sign.  Psych: Patient is not psychotic, no suicidal or hemocidal ideation.  Labs on Admission: I have personally reviewed following labs and imaging studies  CBC:  Recent Labs Lab 05/13/17 1652  WBC 5.7  NEUTROABS 4.3  HGB 13.8  HCT 41.9  MCV 88.8  PLT 962   Basic Metabolic Panel:  Recent Labs Lab 05/13/17 1652  NA 137  K 3.7  CL 103  CO2 28  GLUCOSE 94  BUN 19  CREATININE 0.96  CALCIUM 9.6   GFR: Estimated Creatinine Clearance: 35 mL/min (by C-G formula based on SCr of 0.96 mg/dL). Liver Function Tests: No results for input(s): AST, ALT, ALKPHOS, BILITOT, PROT, ALBUMIN in the last 168 hours. No results for input(s): LIPASE, AMYLASE in the last 168 hours. No results for input(s): AMMONIA in the last  168 hours. Coagulation Profile:  Recent Labs Lab 05/13/17 1652  INR 9.91*   Cardiac Enzymes: No results for input(s): CKTOTAL, CKMB, CKMBINDEX, TROPONINI in the last 168 hours. BNP (last 3 results) No results for input(s): PROBNP in the last 8760 hours. HbA1C: No results for input(s): HGBA1C in the last 72 hours. CBG: No results for input(s): GLUCAP in the last 168 hours. Lipid Profile: No results for input(s): CHOL, HDL, LDLCALC, TRIG, CHOLHDL, LDLDIRECT in the last 72 hours. Thyroid Function Tests: No results for input(s): TSH, T4TOTAL, FREET4, T3FREE, THYROIDAB in the last 72 hours. Anemia Panel: No results for input(s): VITAMINB12, FOLATE, FERRITIN, TIBC, IRON, RETICCTPCT in the last 72 hours. Urine analysis:    Component Value Date/Time   COLORURINE YELLOW 02/05/2014 [place] 02/05/2014 1258   LABSPEC 1.016 02/05/2014 1258   PHURINE 7.0 02/05/2014 1258   GLUCOSEU NEGATIVE 02/05/2014 1258   HGBUR NEGATIVE 02/05/2014 1258   [place] 02/05/2014 1258   KETONESUR NEGATIVE  02/05/2014 1258   PROTEINUR NEGATIVE 02/05/2014 1258   UROBILINOGEN 1.0 02/05/2014 1258   NITRITE NEGATIVE 02/05/2014 1258   LEUKOCYTESUR NEGATIVE 02/05/2014 1258   Sepsis Labs: @LABRCNTIP (procalcitonin:4,lacticidven:4) )No results found for this or any previous visit (from the past 240 hour(s)).   Radiological Exams on Admission: Ct Head Wo Contrast  Result Date: 05/13/2017 CLINICAL DATA:  Minor head trauma while on anti coagulation. EXAM: CT HEAD WITHOUT CONTRAST CT CERVICAL SPINE WITHOUT CONTRAST TECHNIQUE: Multidetector CT imaging of the head and cervical spine was performed following the standard protocol without intravenous contrast. Multiplanar CT image reconstructions of the cervical spine were also generated. COMPARISON:  Brain MRI 02/06/2014 FINDINGS: CT HEAD FINDINGS Brain: No mass lesion, intraparenchymal hemorrhage or extra-axial collection. No evidence of acute cortical infarct. There is periventricular hypoattenuation compatible with chronic microvascular disease. Partially calcified extra-axial mass at the left middle cranial fossa is unchanged compared to the CT of 03/22/2013. Vascular: No hyperdense vessel or unexpected calcification. Skull: Normal visualized skull base, calvarium and extracranial soft tissues. Sinuses/Orbits: No sinus fluid levels or advanced mucosal thickening. No mastoid effusion. Normal orbits. CT CERVICAL SPINE FINDINGS Alignment: No static subluxation. Facets are aligned. Occipital condyles are normally positioned. Skull base and vertebrae: No acute fracture. Soft tissues and spinal canal: No prevertebral fluid or swelling. No visible canal hematoma. Disc levels: Facet arthrosis is worst at left C4-C5. Upper chest: No pneumothorax, pulmonary nodule or pleural effusion. Other: Normal visualized paraspinal cervical soft tissues. IMPRESSION: 1. Sequelae of chronic ischemic microangiopathy without acute intracranial abnormality. 2. Partially calcified left middle  cranial fossa extra-axial mass, possibly meningioma, unchanged compared to 03/22/2013. 3. No acute fracture or static subluxation of the cervical spine. Electronically Signed   By: Ulyses Jarred M.D.   On: 05/13/2017 18:35   Ct Cervical Spine Wo Contrast  Result Date: 05/13/2017 CLINICAL DATA:  Minor head trauma while on anti coagulation. EXAM: CT HEAD WITHOUT CONTRAST CT CERVICAL SPINE WITHOUT CONTRAST TECHNIQUE: Multidetector CT imaging of the head and cervical spine was performed following the standard protocol without intravenous contrast. Multiplanar CT image reconstructions of the cervical spine were also generated. COMPARISON:  Brain MRI 02/06/2014 FINDINGS: CT HEAD FINDINGS Brain: No mass lesion, intraparenchymal hemorrhage or extra-axial collection. No evidence of acute cortical infarct. There is periventricular hypoattenuation compatible with chronic microvascular disease. Partially calcified extra-axial mass at the left middle cranial fossa is unchanged compared to the CT of 03/22/2013. Vascular: No hyperdense vessel or unexpected calcification. Skull: Normal visualized skull  base, calvarium and extracranial soft tissues. Sinuses/Orbits: No sinus fluid levels or advanced mucosal thickening. No mastoid effusion. Normal orbits. CT CERVICAL SPINE FINDINGS Alignment: No static subluxation. Facets are aligned. Occipital condyles are normally positioned. Skull base and vertebrae: No acute fracture. Soft tissues and spinal canal: No prevertebral fluid or swelling. No visible canal hematoma. Disc levels: Facet arthrosis is worst at left C4-C5. Upper chest: No pneumothorax, pulmonary nodule or pleural effusion. Other: Normal visualized paraspinal cervical soft tissues. IMPRESSION: 1. Sequelae of chronic ischemic microangiopathy without acute intracranial abnormality. 2. Partially calcified left middle cranial fossa extra-axial mass, possibly meningioma, unchanged compared to 03/22/2013. 3. No acute fracture or  static subluxation of the cervical spine. Electronically Signed   By: Ulyses Jarred M.D.   On: 05/13/2017 18:35   Ct Lumbar Spine Wo Contrast  Result Date: 05/13/2017 CLINICAL DATA:  Back pain EXAM: CT LUMBAR SPINE WITHOUT CONTRAST TECHNIQUE: Multidetector CT imaging of the lumbar spine was performed without intravenous contrast administration. Multiplanar CT image reconstructions were also generated. COMPARISON:  None. FINDINGS: Segmentation: 5 lumbar type vertebrae. Alignment: Normal. Vertebrae: Compression deformity of L1 with approximately 70% height loss is suspected to be chronic. Superior endplate compression of L2 is also likely chronic. Paraspinal and other soft tissues: There is atherosclerotic calcification of the non aneurysmal abdominal aorta. Disc levels: There is multilevel facet arthrosis, predominantly mild. The disc spaces are largely preserved. There is no spinal canal stenosis or neural foraminal stenosis. IMPRESSION: 1. Compression deformities at L1 and L2 are favored to be chronic. However, in the absence of prior studies for comparison, they remain strictly age indeterminate. 2. No spinal canal or neural foraminal stenosis. 3. Normal alignment of the lumbar spine. Electronically Signed   By: Ulyses Jarred M.D.   On: 05/13/2017 18:24     EKG: Independently reviewed.  Atrial fibrillation with RVR, QTC 483   Assessment/Plan Principal Problem:   Fall Active Problems:   Essential hypertension   Pulmonary arterial hypertension (HCC)   Permanent atrial fibrillation (HCC)   Chronic anticoagulation   Supratherapeutic INR   Meningioma (HCC)   Chronic systolic CHF (congestive heart failure) (HCC)   HLD (hyperlipidemia)   Compression fracture of lumbar vertebra (HCC)   Hypothyroidism   Fall and compression fracture of lumbar vertebra L1-L2:  Patient does not have a focal neurological findings on physical examination. Her fall is likely due to generalized deconditioning. CT scan  showed compression deformities at L1 and L2 which favored to be chronic.   -will place on tele bed for obs -prn percoct for pain -pt/ot -Fall precaution -consult to CM and SW  Hx of A fib and supratherapeutic INR: CHA2DS2-VASc Score is 5, needs oral anticoagulation. Patient is on Coumadin at home. INR is 9.91 on admission. No active bleeding. Heart rate is 100-120s. -will hold coumadin now -5 mg of Vk was ordered by EDP -check INR daily -continue oral metoprolol -IV metoprolol 5 mg every 5 minutes. Heart rate>120  Essential hypertension: Blood pressure 122/83 -Continue metoprolol -Patient is also on Lasix -IV hydralazine when necessary  Chronic systolic CHF in the setting pulmonary hypertension: 2-D echo on 02/06/14 showed EF of 45-50 percent. Patient does not have leg edema or JVD. CHF is compensated. -Continue Lasix and metoprolol. -Check BNP  HDL: -zocor  Hypothyroidism: Last TSH was 0.897 on 02/05/14 -Continue home Synthroid -f/u TSH  Possible meningioma: CT head showed partially calcified left middle cranial fossa extra-axial mass, possibly meningioma, unchanged compared to 03/22/2013. -f/u with  PCP    DVT ppx: SCD Code Status: Full code Family Communication:  Yes, patient's nephew at bed side Disposition Plan:  To be determined Consults called:  none Admission status: Obs / tele    Date of Service 05/14/2017    Ivor Costa [facility] Pager [phone]  If 7PM-7AM, please contact night-coverage [email or web address] Password [email or web address] 05/14/2017, 2:59 AM

## 2017-05-13 NOTE — ED Provider Notes (Signed)
[facility] DEPT Provider Note   CSN: [id number] Arrival date & time: 05/13/17  1616     History   Chief Complaint Chief Complaint  Patient presents with  . Back Pain    HPI Wendy Flowers is a 83 y.o. female.  Patients with history of permanent atrial fibrillation on Coumadin, memory issues -- presents after a fall. Patient  States that she was sitting up from lying to standing and new that she was going to fall over. She states that she slid off the side of the bed. She currently complains of lower back pain and is a little bit worse on the left side. No chest pain or shortness of breath currently or prior to falling. No lightheadedness or dizziness. Patient denies headache. She has some vague neck pain. No chest pain, vomiting. A towel wrap was placed around the patient's neck prior to arrival. She denies pain in her hips or legs or arms. She does not think that she hit her head. She has an apparent subconjunctival hemorrhage of the right sclera but is uncertain how long she had this. The onset of this condition was acute. The course is constant. Aggravating factors: none. Alleviating factors: none.        Past Medical History:  Diagnosis Date  . Lower extremity edema   . Permanent atrial fibrillation ([place])   . Pulmonary hypertension [facility])     Patient Active Problem List   Diagnosis Date Noted  . Restrictive lung disease due to kyphoscoliosis 06/02/2014  . Chronic anticoagulation 02/28/2014  . Kyphosis 02/28/2014  . Community acquired pneumonia 02/05/2014  . CAP (community acquired pneumonia) 02/05/2014  . Memory changes 05/15/2013  . Vertigo 03/18/2013  . Essential hypertension 03/22/2010  . Pulmonary arterial hypertension ([place]) 03/22/2010  . ALLERGIC RHINITIS 03/22/2010  . DYSPNEA ON EXERTION 03/22/2010  . Permanent atrial fibrillation ([place]) 03/21/2010    Past Surgical History:  Procedure Laterality Date  . BREAST LUMPECTOMY  7939   left  . CARDIAC  CATHETERIZATION  06/13/2010   R & LHC: no CAD  . CATARACT EXTRACTION  2008   bilateral  . [place]   left  . US ECHOCARDIOGRAPHY  02/04/2012   LA mod. dilated,RA mild-mod dilated,mild MR,mod. TR    OB History    No data available       Home Medications    Prior to Admission medications   Medication Sig Start Date End Date Taking? Authorizing Provider  Calcium Carb-Cholecalciferol (CALCIUM + D3) 600-200 MG-UNIT TABS Take 1 tablet by mouth 2 (two) times daily.    [provider]  furosemide (LASIX) 40 MG tablet Take 40 mg by mouth.    [provider]  levothyroxine (SYNTHROID, LEVOTHROID) 125 MCG tablet Take 112 mcg by mouth daily before breakfast.     [provider]  metoprolol succinate (TOPROL-XL) 100 MG 24 hr tablet TAKE 1/2 TABLET BY MOUTH IN THE MORNING AND 1/2 TABLET IN THE EVENING 01/30/16   Croitoru, Mihai, MD  PROLIA 60 MG/ML SOLN injection Inject 60 mg into the skin every 6 (six) months. 12/08/13   [provider]  simvastatin (ZOCOR) 40 MG tablet Take 40 mg by mouth every evening.    [provider]  warfarin (COUMADIN) 4 MG tablet Take 2-4 mg by mouth every morning. On Monday and Thursday 4mg .  All other days take 2mg     [provider]    Family History Family History  Problem Relation Age of Onset  .  Heart attack Mother   . Heart attack Father   . Diabetes Brother   . Cancer Brother     Social History Social History  Substance Use Topics  . Smoking status: Never Smoker  . Smokeless tobacco: Never Used  . Alcohol use Yes     Comment: seldom; 03/17/2013-occasionally drinks a glass of wine before bed     Allergies   Foradil [formoterol]   Review of Systems Review of Systems  Constitutional: Negative for fatigue and fever.  HENT: Negative for rhinorrhea, sore throat and tinnitus.   Eyes: Negative for photophobia, pain, redness and visual disturbance.  Respiratory: Negative for cough and  shortness of breath.   Cardiovascular: Negative for chest pain.  Gastrointestinal: Negative for abdominal pain, diarrhea, nausea and vomiting.  Genitourinary: Negative for dysuria.  Musculoskeletal: Positive for back pain and neck pain. Negative for gait problem and myalgias.  Skin: Negative for rash and wound.  Neurological: Negative for dizziness, weakness, light-headedness, numbness and headaches.  Psychiatric/Behavioral: Negative for confusion and decreased concentration.     Physical Exam Updated Vital Signs BP 132/88   Pulse (!) 103   Temp 98.4 F (36.9 C) (Oral)   Resp 19   SpO2 97%   Physical Exam  Constitutional: She appears well-developed and well-nourished.  HENT:  Head: Normocephalic and atraumatic.  Mouth/Throat: Oropharynx is clear and moist.  Eyes: Pupils are equal, round, and reactive to light. EOM are normal. Right eye exhibits no discharge. Left eye exhibits no discharge. Right conjunctiva is not injected. Right conjunctiva has a hemorrhage. Left conjunctiva is not injected. Left conjunctiva has no hemorrhage.  No orbital tenderness.  Neck: Normal range of motion. Neck supple.  Cardiovascular: Normal heart sounds.  An irregularly irregular rhythm present. Tachycardia present.   Pulmonary/Chest: Effort normal and breath sounds normal. No respiratory distress. She has no wheezes. She has no rales.  Abdominal: Soft. There is no tenderness.  Musculoskeletal: She exhibits tenderness.       Right hip: Normal.       Left hip: Normal.       Right knee: Normal.       Left knee: Normal.       Right ankle: Normal.       Left ankle: Normal.       Cervical back: She exhibits no tenderness and no bony tenderness. Decreased range of motion: immobilized.       Thoracic back: She exhibits normal range of motion, no tenderness and no bony tenderness.       Lumbar back: She exhibits tenderness and bony tenderness (midline to left-sided). She exhibits normal range of motion.    Neurological: She is alert.  Skin: Skin is warm and dry.  Psychiatric: She has a normal mood and affect.  Nursing note and vitals reviewed.    ED Treatments / Results  Labs (all labs ordered are listed, but only abnormal results are displayed) Labs Reviewed  BASIC METABOLIC PANEL - Abnormal; Notable for the following:       Result Value   GFR calc non Af Amer 51 (*)    GFR calc Af Amer 59 (*)    All other components within normal limits  PROTIME-INR - Abnormal; Notable for the following:    Prothrombin Time 78.6 (*)    INR 9.91 (*)    All other components within normal limits  CBC WITH DIFFERENTIAL/PLATELET  URINALYSIS, ROUTINE W REFLEX MICROSCOPIC    Radiology Ct Head Wo Contrast  Result Date:  05/13/2017 CLINICAL DATA:  Minor head trauma while on anti coagulation. EXAM: CT HEAD WITHOUT CONTRAST CT CERVICAL SPINE WITHOUT CONTRAST TECHNIQUE: Multidetector CT imaging of the head and cervical spine was performed following the standard protocol without intravenous contrast. Multiplanar CT image reconstructions of the cervical spine were also generated. COMPARISON:  Brain MRI 02/06/2014 FINDINGS: CT HEAD FINDINGS Brain: No mass lesion, intraparenchymal hemorrhage or extra-axial collection. No evidence of acute cortical infarct. There is periventricular hypoattenuation compatible with chronic microvascular disease. Partially calcified extra-axial mass at the left middle cranial fossa is unchanged compared to the CT of 03/22/2013. Vascular: No hyperdense vessel or unexpected calcification. Skull: Normal visualized skull base, calvarium and extracranial soft tissues. Sinuses/Orbits: No sinus fluid levels or advanced mucosal thickening. No mastoid effusion. Normal orbits. CT CERVICAL SPINE FINDINGS Alignment: No static subluxation. Facets are aligned. Occipital condyles are normally positioned. Skull base and vertebrae: No acute fracture. Soft tissues and spinal canal: No prevertebral fluid or  swelling. No visible canal hematoma. Disc levels: Facet arthrosis is worst at left C4-C5. Upper chest: No pneumothorax, pulmonary nodule or pleural effusion. Other: Normal visualized paraspinal cervical soft tissues. IMPRESSION: 1. Sequelae of chronic ischemic microangiopathy without acute intracranial abnormality. 2. Partially calcified left middle cranial fossa extra-axial mass, possibly meningioma, unchanged compared to 03/22/2013. 3. No acute fracture or static subluxation of the cervical spine. Electronically Signed   By: Ulyses Jarred M.D.   On: 05/13/2017 18:35   Ct Cervical Spine Wo Contrast  Result Date: 05/13/2017 CLINICAL DATA:  Minor head trauma while on anti coagulation. EXAM: CT HEAD WITHOUT CONTRAST CT CERVICAL SPINE WITHOUT CONTRAST TECHNIQUE: Multidetector CT imaging of the head and cervical spine was performed following the standard protocol without intravenous contrast. Multiplanar CT image reconstructions of the cervical spine were also generated. COMPARISON:  Brain MRI 02/06/2014 FINDINGS: CT HEAD FINDINGS Brain: No mass lesion, intraparenchymal hemorrhage or extra-axial collection. No evidence of acute cortical infarct. There is periventricular hypoattenuation compatible with chronic microvascular disease. Partially calcified extra-axial mass at the left middle cranial fossa is unchanged compared to the CT of 03/22/2013. Vascular: No hyperdense vessel or unexpected calcification. Skull: Normal visualized skull base, calvarium and extracranial soft tissues. Sinuses/Orbits: No sinus fluid levels or advanced mucosal thickening. No mastoid effusion. Normal orbits. CT CERVICAL SPINE FINDINGS Alignment: No static subluxation. Facets are aligned. Occipital condyles are normally positioned. Skull base and vertebrae: No acute fracture. Soft tissues and spinal canal: No prevertebral fluid or swelling. No visible canal hematoma. Disc levels: Facet arthrosis is worst at left C4-C5. Upper chest: No  pneumothorax, pulmonary nodule or pleural effusion. Other: Normal visualized paraspinal cervical soft tissues. IMPRESSION: 1. Sequelae of chronic ischemic microangiopathy without acute intracranial abnormality. 2. Partially calcified left middle cranial fossa extra-axial mass, possibly meningioma, unchanged compared to 03/22/2013. 3. No acute fracture or static subluxation of the cervical spine. Electronically Signed   By: Ulyses Jarred M.D.   On: 05/13/2017 18:35   Ct Lumbar Spine Wo Contrast  Result Date: 05/13/2017 CLINICAL DATA:  Back pain EXAM: CT LUMBAR SPINE WITHOUT CONTRAST TECHNIQUE: Multidetector CT imaging of the lumbar spine was performed without intravenous contrast administration. Multiplanar CT image reconstructions were also generated. COMPARISON:  None. FINDINGS: Segmentation: 5 lumbar type vertebrae. Alignment: Normal. Vertebrae: Compression deformity of L1 with approximately 70% height loss is suspected to be chronic. Superior endplate compression of L2 is also likely chronic. Paraspinal and other soft tissues: There is atherosclerotic calcification of the non aneurysmal abdominal aorta. Disc levels: There  is multilevel facet arthrosis, predominantly mild. The disc spaces are largely preserved. There is no spinal canal stenosis or neural foraminal stenosis. IMPRESSION: 1. Compression deformities at L1 and L2 are favored to be chronic. However, in the absence of prior studies for comparison, they remain strictly age indeterminate. 2. No spinal canal or neural foraminal stenosis. 3. Normal alignment of the lumbar spine. Electronically Signed   By: Ulyses Jarred M.D.   On: 05/13/2017 18:24    Procedures Procedures (including critical care time)  Medications Ordered in ED Medications  metoprolol succinate (TOPROL-XL) 24 hr tablet 50 mg (50 mg Oral Given 05/13/17 1922)  phytonadione (VITAMIN K) tablet 5 mg (5 mg Oral Given 05/13/17 1941)     Initial Impression / Assessment and Plan / ED  Course  I have reviewed the triage vital signs and the nursing notes.  Pertinent labs & imaging results that were available during my care of the patient were reviewed by me and considered in my medical decision making (see chart for details).     Patient seen and examined. She is in mildly rapid afib. She seems a bit confused -- unclear how trustworty her history is. Work-up initiated.    Vital signs reviewed and are as follows: BP 132/88   Pulse (!) 103   Temp 98.4 F (36.9 C) (Oral)   Resp 19   SpO2 97%   ED ECG REPORT   Date: 05/13/2017  Rate: 126  Rhythm: atrial fibrillation  QRS Axis: normal  Intervals: normal  ST/T Wave abnormalities: normal  Conduction Disutrbances:none  Narrative Interpretation:   Old EKG Reviewed: unchanged (HR 104)  I have personally reviewed the EKG tracing and agree with the computerized printout as noted.  8:07 PM workup reveals no intracranial bleeding. Patient remains in rapid A. Fib. Her INR is 9.91. This was treated withvitamin K given the fact that she is not having any active bleeding.  Discussed with Dr. Johnney Killian.  Plan is to admit for observation given head injury in setting of supratherapeutic INR.   Spoke with Dr. Blaine Hamper who will see patient.    Final Clinical Impressions(s) / ED Diagnoses   Final diagnoses:  Traumatic injury of head, initial encounter  Supratherapeutic INR  Chronic atrial fibrillation with RVR ([place])  Fall, initial encounter   Admit.   New Prescriptions New Prescriptions   No medications on file     Suann Larry 05/13/17 2008    Charlesetta Shanks, MD 05/13/17 2013

## 2017-05-13 NOTE — ED Triage Notes (Signed)
Per EMS: Pt from home (lives alone) with c/o of lower back and flank pain.  Pt also states "My neck feels weird".  EMS asked pt if she had fallen during the night and pt was unsure.  EMS states pt has short term memory loss.  One (older) bruise to left side of face noted.  EMS reports pt on coumadin.  Pt A/O x4 upon arrival.  PTA vitals: 110/710 BP, 110 HR, 16 RR, and CBG 100.

## 2017-05-13 NOTE — Progress Notes (Signed)
Patient arrived to [place] form ED. patient oriented to room. Patient is alert and oriented x3 forgetful and unable to recall events form earlier today. States " I don't know why I am in the hospital today. Vitals stable CCMD notified. Will continue to monitor patient.

## 2017-05-14 DIAGNOSIS — I5022 Chronic systolic (congestive) heart failure: Secondary | ICD-10-CM

## 2017-05-14 DIAGNOSIS — Z7901 Long term (current) use of anticoagulants: Secondary | ICD-10-CM | POA: Diagnosis not present

## 2017-05-14 DIAGNOSIS — E038 Other specified hypothyroidism: Secondary | ICD-10-CM | POA: Diagnosis not present

## 2017-05-14 DIAGNOSIS — R791 Abnormal coagulation profile: Secondary | ICD-10-CM | POA: Diagnosis not present

## 2017-05-14 DIAGNOSIS — E782 Mixed hyperlipidemia: Secondary | ICD-10-CM | POA: Diagnosis not present

## 2017-05-14 DIAGNOSIS — S32010A Wedge compression fracture of first lumbar vertebra, initial encounter for closed fracture: Secondary | ICD-10-CM

## 2017-05-14 DIAGNOSIS — I482 Chronic atrial fibrillation: Secondary | ICD-10-CM | POA: Diagnosis not present

## 2017-05-14 DIAGNOSIS — E039 Hypothyroidism, unspecified: Secondary | ICD-10-CM | POA: Diagnosis present

## 2017-05-14 DIAGNOSIS — W19XXXA Unspecified fall, initial encounter: Secondary | ICD-10-CM | POA: Diagnosis not present

## 2017-05-14 LAB — PROTIME-INR
INR: 10.99
INR: 9.95
PROTHROMBIN TIME: 85.2 s — AB (ref 11.4–15.2)
PROTHROMBIN TIME: 78.9 s — AB (ref 11.4–15.2)

## 2017-05-14 LAB — URINALYSIS, ROUTINE W REFLEX MICROSCOPIC
Bilirubin Urine: NEGATIVE
Glucose, UA: NEGATIVE mg/dL
Hgb urine dipstick: NEGATIVE
Ketones, ur: NEGATIVE mg/dL
Nitrite: NEGATIVE
PROTEIN: NEGATIVE mg/dL
Specific Gravity, Urine: 1.009 (ref 1.005–1.030)
pH: 6 (ref 5.0–8.0)

## 2017-05-14 LAB — CBC
HCT: 37.9 % (ref 36.0–46.0)
HEMOGLOBIN: 12.5 g/dL (ref 12.0–15.0)
MCH: 29.6 pg (ref 26.0–34.0)
MCHC: 33 g/dL (ref 30.0–36.0)
MCV: 89.6 fL (ref 78.0–100.0)
Platelets: 272 10*3/uL (ref 150–400)
RBC: 4.23 MIL/uL (ref 3.87–5.11)
RDW: 15.5 % (ref 11.5–15.5)
WBC: 5.5 10*3/uL (ref 4.0–10.5)

## 2017-05-14 LAB — TSH: TSH: 16.315 u[IU]/mL — ABNORMAL HIGH (ref 0.350–4.500)

## 2017-05-14 LAB — BASIC METABOLIC PANEL
ANION GAP: 8 (ref 5–15)
BUN: 19 mg/dL (ref 6–20)
CHLORIDE: 102 mmol/L (ref 101–111)
CO2: 29 mmol/L (ref 22–32)
Calcium: 9.2 mg/dL (ref 8.9–10.3)
Creatinine, Ser: 0.93 mg/dL (ref 0.44–1.00)
GFR calc Af Amer: >60 mL/min (ref >60)
GFR calc non Af Amer: 53 mL/min — ABNORMAL LOW (ref >60)
Glucose, Bld: 103 mg/dL — ABNORMAL HIGH (ref 65–99)
POTASSIUM: 3.4 mmol/L — AB (ref 3.5–5.1)
SODIUM: 139 mmol/L (ref 135–145)

## 2017-05-14 LAB — TYPE AND SCREEN
ABO/RH(D): A POS
ANTIBODY SCREEN: NEGATIVE

## 2017-05-14 LAB — ABO/RH: ABO/RH(D): A POS

## 2017-05-14 LAB — BRAIN NATRIURETIC PEPTIDE: B Natriuretic Peptide: 298.6 pg/mL — ABNORMAL HIGH (ref 0.0–100.0)

## 2017-05-14 MED ORDER — DIPHENHYDRAMINE HCL 50 MG/ML IJ SOLN
25.0000 mg | Freq: Once | INTRAMUSCULAR | Status: DC
  Filled 2017-05-14: qty 1

## 2017-05-14 MED ORDER — PHYTONADIONE 5 MG PO TABS
5.0000 mg | ORAL_TABLET | Freq: Once | ORAL | Status: AC
  Administered 2017-05-14: 5 mg via ORAL
  Filled 2017-05-14: qty 1

## 2017-05-14 MED ORDER — INFLUENZA VAC SPLIT HIGH-DOSE 0.5 ML IM SUSY
0.5000 mL | PREFILLED_SYRINGE | INTRAMUSCULAR | Status: AC
  Administered 2017-05-15: 0.5 mL via INTRAMUSCULAR
  Filled 2017-05-14 (×2): qty 0.5

## 2017-05-14 MED ORDER — ACETAMINOPHEN 325 MG PO TABS
650.0000 mg | ORAL_TABLET | Freq: Once | ORAL | Status: DC
  Filled 2017-05-14: qty 2

## 2017-05-14 MED ORDER — FUROSEMIDE 10 MG/ML IJ SOLN
20.0000 mg | Freq: Once | INTRAMUSCULAR | Status: DC
  Filled 2017-05-14: qty 2

## 2017-05-14 MED ORDER — POTASSIUM CHLORIDE CRYS ER 20 MEQ PO TBCR
40.0000 meq | EXTENDED_RELEASE_TABLET | Freq: Once | ORAL | Status: AC
  Administered 2017-05-14: 40 meq via ORAL
  Filled 2017-05-14: qty 2

## 2017-05-14 MED ORDER — SODIUM CHLORIDE 0.9 % IV SOLN: Freq: Once | INTRAVENOUS | Status: DC

## 2017-05-14 NOTE — Progress Notes (Signed)
Attempted to call Nephew again, no one picked up. Will notify Doctor.

## 2017-05-14 NOTE — Care Management Obs Status (Signed)
[place] NOTIFICATION   Patient Details  Name: LOURIE RETZ MRN: [id number] Date of Birth: 19-Jun-1927   Medicare Observation Status Notification Given:  Yes    Erenest Rasher, RN 05/14/2017, 6:29 PM

## 2017-05-14 NOTE — Progress Notes (Signed)
PT Cancellation Note  Patient Details Name: Wendy Flowers MRN: [id number] DOB: [birth year]   Cancelled Treatment:    Reason Eval/Treat Not Completed: Medical issues which prohibited therapy. Pt with critical INR of 10.99 this am. Will check back as medically appropriate.   Despina Pole 05/14/2017, 10:20 AM Carita Pian Sanjuana Kava, [place] Pager [phone]

## 2017-05-14 NOTE — Progress Notes (Signed)
PROGRESS NOTE        PATIENT DETAILS Name: Wendy Flowers Age: 83 y.o. Sex: female Date of Birth: [birth year] Admit Date: 05/13/2017 Admitting Physician Ivor Costa, MD HGD:[id number], Thayer Jew, MD  Brief Narrative: Patient is a 83 y.o. female with history of atrial fibrillation on Coumadin, hypertension, chronic systolic heart failure, hypothyroidism admitted following a mechanical fall, and found to have worsening of her chronic low back pain. She was also found to have a supratherapeutic INR without any overt bleeding. Subsequently admitted for further evaluation and treatment, see below for further details  Subjective: Hard his hearing-but back pain appears to be stable. Nephew at bedside.  Assessment/Plan: Mechanical fall: Apparently she just slid out of bed-no loss of consciousness-CT head, CT C-spine did not show acute abnormalities, CT of the lumbar spine shows age-indeterminate L1 and L2 fractures. Plans are to ambulate with physical therapy.  Supratherapeutic INR: No evidence of bleeding-repeat vitamin K 1 today-hold off on transfusing FFP had this time. Repeat INR tomorrow morning. Not sure why her INR rose to specific this day, per nephew a few weeks back and was around 3. Patient denies being placed on any new medications including any antibiotics-she does take Tylenol frequently for her back-? Related to Tylenol interaction.  Compression fracture: Seen on CT of the lumbar spine-these are age indeterminate-probably related to underlying osteoporosis. Stable for outpatient follow-up-continue with supportive care in the interim.  Atrial fibrillation: Rate controlled with metoprolol, anticoagulated with Coumadin-continue to hold Coumadin given supratherapeutic INR. Discussed with patient and nephew at bedside-both are leaning towards continuing anticoagulation-as this is her first episode of fall (slid out of her bed)-she has apparently tolerated anticoagulation pretty  well until this episode.  Dyslipidemia: Continue with statin  Hypothyroidism: Continue with Synthroid  Chronic systolic heart failure: Appears stable, continue metoprolol and Lasix.  Hypokalemia: Replete and recheck  Partially calcified meningioma: Stable for outpatient follow-up.  DVT Prophylaxis: Full dose anticoagulation with Coumadin  Code Status: Full code  Family Communication: Niece at bedside  Disposition Plan: Remain inpatient-but will plan on Home health vs SNF on discharge  Antimicrobial agents: Anti-infectives    None      Procedures: None  CONSULTS:  None  Time spent: 25- minutes-Greater than 50% of this time was spent in counseling, explanation of diagnosis, planning of further management, and coordination of care.  MEDICATIONS: Scheduled Meds: . acetaminophen  650 mg Oral Once  . calcium-vitamin D  1 tablet Oral BID  . diphenhydrAMINE  25 mg Intravenous Once  . furosemide  20 mg Intravenous Once  . furosemide  40 mg Oral Daily  . [START ON 05/15/2017] Influenza vac split quadrivalent PF  0.5 mL Intramuscular Tomorrow-1000  . levothyroxine  137 mcg Oral QHS  . metoprolol succinate  50 mg Oral BID  . simvastatin  40 mg Oral QPM   Continuous Infusions: . sodium chloride     PRN Meds:.acetaminophen, hydrALAZINE, metoprolol tartrate, ondansetron **OR** ondansetron (ZOFRAN) IV, oxyCODONE-acetaminophen, zolpidem   PHYSICAL EXAM: Vital signs: Vitals:   05/14/17 0743 05/14/17 0819 05/14/17 1232 05/14/17 1715  BP: 107/65 116/64 114/78 123/86  Pulse: 82 100 (!) 108 (!) 122  Resp: 18  18 18   Temp: 97.9 F (36.6 C)  98 F (36.7 C) (!) 97.5 F (36.4 C)  TempSrc: Oral  Oral Oral  SpO2: 96% 96% 93% 97%  Weight:      Height:       Filed Weights   05/13/17 1650 05/13/17 2212 05/14/17 0511  Weight: 62.1 kg (137 lb) 62.6 kg (138 lb) 61.8 kg (136 lb 3.2 oz)   Body mass index is 23.38 kg/m.   General appearance :Awake, alert, not in any  distress. Speech Clear.  Eyes:, pupils equally reactive to light and accomodation,no scleral icterus. HEENT: Atraumatic and Normocephalic Neck: supple, no JVD. No cervical lymphadenopathy.  Resp:Good air entry bilaterally, no added sounds  CVS: S1 S2 regular GI: Bowel sounds present, Non tender and not distended with no gaurding, rigidity or rebound.No organomegaly Extremities: B/L Lower Ext shows no edema, both legs are warm to touch Neurology:  speech clear,Non focal, sensation is grossly intact. Musculoskeletal:No digital cyanosis Skin:No Rash, warm and dry Wounds:N/A  I have personally reviewed following labs and imaging studies  LABORATORY DATA: CBC:  Recent Labs Lab 05/13/17 1652 05/14/17 0320  WBC 5.7 5.5  NEUTROABS 4.3  --   HGB 13.8 12.5  HCT 41.9 37.9  MCV 88.8 89.6  PLT 277 062    Basic Metabolic Panel:  Recent Labs Lab 05/13/17 1652 05/14/17 0320  NA 137 139  K 3.7 3.4*  CL 103 102  CO2 28 29  GLUCOSE 94 103*  BUN 19 19  CREATININE 0.96 0.93  CALCIUM 9.6 9.2    GFR: Estimated Creatinine Clearance: 36.1 mL/min (by C-G formula based on SCr of 0.93 mg/dL).  Liver Function Tests: No results for input(s): AST, ALT, ALKPHOS, BILITOT, PROT, ALBUMIN in the last 168 hours. No results for input(s): LIPASE, AMYLASE in the last 168 hours. No results for input(s): AMMONIA in the last 168 hours.  Coagulation Profile:  Recent Labs Lab 05/13/17 1652 05/14/17 0320 05/14/17 1006  INR 9.91* 10.99* 9.95*    Cardiac Enzymes: No results for input(s): CKTOTAL, CKMB, CKMBINDEX, TROPONINI in the last 168 hours.  BNP (last 3 results) No results for input(s): PROBNP in the last 8760 hours.  HbA1C: No results for input(s): HGBA1C in the last 72 hours.  CBG: No results for input(s): GLUCAP in the last 168 hours.  Lipid Profile: No results for input(s): CHOL, HDL, LDLCALC, TRIG, CHOLHDL, LDLDIRECT in the last 72 hours.  Thyroid Function Tests:  Recent  Labs  05/14/17 0320  TSH 16.315*    Anemia Panel: No results for input(s): VITAMINB12, FOLATE, FERRITIN, TIBC, IRON, RETICCTPCT in the last 72 hours.  Urine analysis:    Component Value Date/Time   COLORURINE YELLOW 05/13/2017 [place] 05/13/2017 1754   LABSPEC 1.009 05/13/2017 1754   PHURINE 6.0 05/13/2017 1754   GLUCOSEU NEGATIVE 05/13/2017 1754   HGBUR NEGATIVE 05/13/2017 1754   BILIRUBINUR NEGATIVE 05/13/2017 1754   KETONESUR NEGATIVE 05/13/2017 1754   PROTEINUR NEGATIVE 05/13/2017 1754   UROBILINOGEN 1.0 02/05/2014 1258   NITRITE NEGATIVE 05/13/2017 1754   LEUKOCYTESUR SMALL (A) 05/13/2017 1754    Sepsis Labs: Lactic Acid, Venous No results found for: LATICACIDVEN  MICROBIOLOGY: No results found for this or any previous visit (from the past 240 hour(s)).  RADIOLOGY STUDIES/RESULTS: Ct Head Wo Contrast  Result Date: 05/13/2017 CLINICAL DATA:  Minor head trauma while on anti coagulation. EXAM: CT HEAD WITHOUT CONTRAST CT CERVICAL SPINE WITHOUT CONTRAST TECHNIQUE: Multidetector CT imaging of the head and cervical spine was performed following the standard protocol without intravenous contrast. Multiplanar CT image reconstructions of the cervical spine were also generated. COMPARISON:  Brain MRI 02/06/2014 FINDINGS: CT  HEAD FINDINGS Brain: No mass lesion, intraparenchymal hemorrhage or extra-axial collection. No evidence of acute cortical infarct. There is periventricular hypoattenuation compatible with chronic microvascular disease. Partially calcified extra-axial mass at the left middle cranial fossa is unchanged compared to the CT of 03/22/2013. Vascular: No hyperdense vessel or unexpected calcification. Skull: Normal visualized skull base, calvarium and extracranial soft tissues. Sinuses/Orbits: No sinus fluid levels or advanced mucosal thickening. No mastoid effusion. Normal orbits. CT CERVICAL SPINE FINDINGS Alignment: No static subluxation. Facets are  aligned. Occipital condyles are normally positioned. Skull base and vertebrae: No acute fracture. Soft tissues and spinal canal: No prevertebral fluid or swelling. No visible canal hematoma. Disc levels: Facet arthrosis is worst at left C4-C5. Upper chest: No pneumothorax, pulmonary nodule or pleural effusion. Other: Normal visualized paraspinal cervical soft tissues. IMPRESSION: 1. Sequelae of chronic ischemic microangiopathy without acute intracranial abnormality. 2. Partially calcified left middle cranial fossa extra-axial mass, possibly meningioma, unchanged compared to 03/22/2013. 3. No acute fracture or static subluxation of the cervical spine. Electronically Signed   By: Ulyses Jarred M.D.   On: 05/13/2017 18:35   Ct Cervical Spine Wo Contrast  Result Date: 05/13/2017 CLINICAL DATA:  Minor head trauma while on anti coagulation. EXAM: CT HEAD WITHOUT CONTRAST CT CERVICAL SPINE WITHOUT CONTRAST TECHNIQUE: Multidetector CT imaging of the head and cervical spine was performed following the standard protocol without intravenous contrast. Multiplanar CT image reconstructions of the cervical spine were also generated. COMPARISON:  Brain MRI 02/06/2014 FINDINGS: CT HEAD FINDINGS Brain: No mass lesion, intraparenchymal hemorrhage or extra-axial collection. No evidence of acute cortical infarct. There is periventricular hypoattenuation compatible with chronic microvascular disease. Partially calcified extra-axial mass at the left middle cranial fossa is unchanged compared to the CT of 03/22/2013. Vascular: No hyperdense vessel or unexpected calcification. Skull: Normal visualized skull base, calvarium and extracranial soft tissues. Sinuses/Orbits: No sinus fluid levels or advanced mucosal thickening. No mastoid effusion. Normal orbits. CT CERVICAL SPINE FINDINGS Alignment: No static subluxation. Facets are aligned. Occipital condyles are normally positioned. Skull base and vertebrae: No acute fracture. Soft tissues  and spinal canal: No prevertebral fluid or swelling. No visible canal hematoma. Disc levels: Facet arthrosis is worst at left C4-C5. Upper chest: No pneumothorax, pulmonary nodule or pleural effusion. Other: Normal visualized paraspinal cervical soft tissues. IMPRESSION: 1. Sequelae of chronic ischemic microangiopathy without acute intracranial abnormality. 2. Partially calcified left middle cranial fossa extra-axial mass, possibly meningioma, unchanged compared to 03/22/2013. 3. No acute fracture or static subluxation of the cervical spine. Electronically Signed   By: Ulyses Jarred M.D.   On: 05/13/2017 18:35   Ct Lumbar Spine Wo Contrast  Result Date: 05/13/2017 CLINICAL DATA:  Back pain EXAM: CT LUMBAR SPINE WITHOUT CONTRAST TECHNIQUE: Multidetector CT imaging of the lumbar spine was performed without intravenous contrast administration. Multiplanar CT image reconstructions were also generated. COMPARISON:  None. FINDINGS: Segmentation: 5 lumbar type vertebrae. Alignment: Normal. Vertebrae: Compression deformity of L1 with approximately 70% height loss is suspected to be chronic. Superior endplate compression of L2 is also likely chronic. Paraspinal and other soft tissues: There is atherosclerotic calcification of the non aneurysmal abdominal aorta. Disc levels: There is multilevel facet arthrosis, predominantly mild. The disc spaces are largely preserved. There is no spinal canal stenosis or neural foraminal stenosis. IMPRESSION: 1. Compression deformities at L1 and L2 are favored to be chronic. However, in the absence of prior studies for comparison, they remain strictly age indeterminate. 2. No spinal canal or neural foraminal stenosis. 3.  Normal alignment of the lumbar spine. Electronically Signed   By: Ulyses Jarred M.D.   On: 05/13/2017 18:24     LOS: 0 days   Oren Binet, MD  [facility] Pager:[phone]  If 7PM-7AM, please contact night-coverage [email or web address] Password  [email or web address] 05/14/2017, 5:34 PM

## 2017-05-14 NOTE — Progress Notes (Addendum)
CRITICAL VALUE ALERT  Critical Value:  IMR 10.99  Date & Time Notied:  05/14/2017 0430  Provider Notified:  Chaney Malling, NP  Orders Received/Actions taken:

## 2017-05-14 NOTE — Progress Notes (Signed)
Attempted to call nephew to get consent to infuse plasma for patient. He did not answer so will try again shortly.

## 2017-05-14 NOTE — Care Management Note (Signed)
Case Management Note  Patient Details  Name: Wendy Flowers MRN: [id number] Date of Birth: [birth year]  Subjective/Objective:     Mechanical fall, subtherapeutic INR, compression fracture               Action/Plan: Discharge Planning: NCM spoke to pt at bedside. States she lives at home alone. She has a medical alert pendant. States she has a walker. She was still driving to her appts. States her nephew or his wife will assist her at home as needed. Pt states she will wait until PT/OT recommendations to decide if she will go to SNF rehab or Home with [facility]. States she prefers to go home with [facility]. Offered choice for HH/list provided. Pt agreeable to [facility].    PCP Anda Kraft MD   Expected Discharge Date:                  Expected Discharge Plan:  [place]  In-House Referral:  Clinical Social Work  Discharge planning Services  CM Consult  Post Acute Care Choice:  Home Health Choice offered to:  Patient  DME Arranged:    DME Agency:     HH Arranged:  [place] Agency:     Status of Service:  In process, will continue to follow  If discussed at Long Length of Stay Meetings, dates discussed:    Additional Comments:  Erenest Rasher, RN 05/14/2017, 6:15 PM

## 2017-05-14 NOTE — Progress Notes (Signed)
OT Cancellation Note  Patient Details Name: CAELI LINEHAN MRN: [id number] DOB: Jul 03, 1927   Cancelled Treatment:    Reason Eval/Treat Not Completed: Medical issues which prohibited therapy. Pt with critical INR of 10.99 this am. Will check back as medically appropriate.  Norman Herrlich, MS OTR/L  Pager: [place] A Virlan Kempker 05/14/2017, 8:04 AM

## 2017-05-14 NOTE — Progress Notes (Signed)
Repositioned pt every 2 hours.

## 2017-05-14 NOTE — Progress Notes (Signed)
Unable to complete admission database at this time, pt is not alert and oriented x4 Awaiting family arrival to complete

## 2017-05-14 NOTE — Progress Notes (Signed)
Chaplain visited with patient via consult.  Patient has nephew visiting and conversation continues around her health and that she is feeling really well.  Patient states "I am not too sure why I am here" but knows her medications are being looked at and test being run.  Chaplain will continue to provide support as needed.    05/14/17 1159  Clinical Encounter Type  Visited With Patient and family together  Visit Type Initial;Psychological support;Spiritual support;Social support

## 2017-05-14 NOTE — Progress Notes (Signed)
CRITICAL VALUE ALERT  Critical Value:  INR 9.95  Date & Time Notied:  12:20  Provider Notified: 12:20  Orders Received/Actions taken:

## 2017-05-14 NOTE — Progress Notes (Signed)
Completed admission database with pt nephew at bedside  Requested healthcare power of attorney and legal guardian paperwork

## 2017-05-15 DIAGNOSIS — I482 Chronic atrial fibrillation: Secondary | ICD-10-CM

## 2017-05-15 DIAGNOSIS — Z7901 Long term (current) use of anticoagulants: Secondary | ICD-10-CM | POA: Diagnosis not present

## 2017-05-15 DIAGNOSIS — R791 Abnormal coagulation profile: Secondary | ICD-10-CM

## 2017-05-15 DIAGNOSIS — W19XXXA Unspecified fall, initial encounter: Secondary | ICD-10-CM

## 2017-05-15 DIAGNOSIS — E782 Mixed hyperlipidemia: Secondary | ICD-10-CM

## 2017-05-15 DIAGNOSIS — S32010A Wedge compression fracture of first lumbar vertebra, initial encounter for closed fracture: Secondary | ICD-10-CM | POA: Diagnosis not present

## 2017-05-15 DIAGNOSIS — I5022 Chronic systolic (congestive) heart failure: Secondary | ICD-10-CM | POA: Diagnosis not present

## 2017-05-15 DIAGNOSIS — E038 Other specified hypothyroidism: Secondary | ICD-10-CM | POA: Diagnosis not present

## 2017-05-15 LAB — PREPARE FRESH FROZEN PLASMA
UNIT DIVISION: 0
Unit division: 0

## 2017-05-15 LAB — PROTIME-INR
INR: 2
PROTHROMBIN TIME: 22.5 s — AB (ref 11.4–15.2)

## 2017-05-15 LAB — BPAM FFP
Blood Product Expiration Date: 201810052359
Blood Product Expiration Date: 201810052359
ISSUE DATE / TIME: 201810021655
Unit Type and Rh: 6200
Unit Type and Rh: 6200

## 2017-05-15 LAB — BASIC METABOLIC PANEL
ANION GAP: 7 (ref 5–15)
BUN: 22 mg/dL — ABNORMAL HIGH (ref 6–20)
CHLORIDE: 100 mmol/L — AB (ref 101–111)
CO2: 30 mmol/L (ref 22–32)
CREATININE: 0.95 mg/dL (ref 0.44–1.00)
Calcium: 9.8 mg/dL (ref 8.9–10.3)
GFR calc non Af Amer: 52 mL/min — ABNORMAL LOW (ref >60)
Glucose, Bld: 104 mg/dL — ABNORMAL HIGH (ref 65–99)
POTASSIUM: 3.9 mmol/L (ref 3.5–5.1)
SODIUM: 137 mmol/L (ref 135–145)

## 2017-05-15 LAB — MAGNESIUM: Magnesium: 1.8 mg/dL (ref 1.7–2.4)

## 2017-05-15 MED ORDER — WARFARIN SODIUM 3 MG PO TABS
3.0000 mg | ORAL_TABLET | Freq: Once | ORAL | Status: AC
  Administered 2017-05-15: 3 mg via ORAL
  Filled 2017-05-15: qty 1

## 2017-05-15 MED ORDER — WARFARIN - PHARMACIST DOSING INPATIENT
Freq: Every day | Status: DC
  Administered 2017-05-15: 1

## 2017-05-15 NOTE — Progress Notes (Addendum)
PROGRESS NOTE        PATIENT DETAILS Name: Wendy Flowers Age: 83 y.o. Sex: female Date of Birth: 17-Oct-1926 Admit Date: 05/13/2017 Admitting Physician Ivor Costa, MD OIZ:[id number], Thayer Jew, MD  Brief Narrative: Patient is a 83 y.o. female with history of atrial fibrillation on Coumadin, hypertension, chronic systolic heart failure, hypothyroidism admitted following a mechanical fall, and found to have worsening of her chronic low back pain. She was also found to have a supratherapeutic INR without any overt bleeding. Subsequently admitted for further evaluation and treatment, see below for further details  Subjective: Sleeping comfortably when I walked in this morning-her back pain is okay and not uncontrollable-seems tolerable per patient.   Assessment/Plan: Mechanical fall: Apparently she just slid out of bed-no loss of consciousness-CT head, CT C-spine did not show acute abnormalities, CT of the lumbar spine shows age-indeterminate L1 and L2 fractures. Await physical therapy evaluation to see if she needs short-term SNF or can go home with home health services.   Supratherapeutic INR: There was no evidence of bleeding-she received vitamin K 2-repeat INR this morning is 2. Not sure why she had such a supratherapeutic INR-no recent antimicrobial therapy-she apparently was taking Tylenol for her back pain-I wonder if this was drug interaction from Tylenol. E xtensive discussion with patient and her nephew on 10/3-since this is her first episode of fall (she actually slid off a bed)-day would like to continue with anticoagulation for the immediate future and decide on stopping anticoagulation if she continues to have recurrent falls. Hence we will restart Coumadin, we will see if we can discuss this with her primary cardiologist. he did restart Coumadin, we will discuss this with her Primary cardiologist.  Compression fracture: Seen on CT of the lumbar spine-these are age  indeterminate-probably related to underlying osteoporosis. Stable for outpatient follow-up-continue with supportive care in the interim.  Atrial fibrillation: Rate controlled with metoprolol-see above regarding anticoagulation. Rate controlled with metoprolol, anticoagulated with Coumadin-continue to hold Coumadin given supratherapeutic INR. Discussed with patient and nephew at bedside-both are leaning towards continuing anticoagulation-as this is her first episode of fall (slid out of her bed)-she has apparently tolerated anticoagulation pretty well until this episode.  Dyslipidemia: Continue statin  Hypothyroidism: Continue Synthroid at current dosing-her TSH appears to be mildly elevated-unsure when her last dosing was adjusted-she is stable for her TSH to be rechecked in a month or so by her PCP for further adjustment if still elevated then.  Chronic systolic heart failure: Appears euvolemic-continue metoprolol and Lasix.   Hypokalemia: Repleted, recheck periodically  Partially calcified meningioma: Stable for outpatient follow-up.  DVT Prophylaxis: Full dose anticoagulation with Coumadin  Code Status: Full code  Family Communication: None at bedside  Disposition Plan: Remain inpatient-but will plan on Home health vs SNF on discharge  Antimicrobial agents: Anti-infectives    None      Procedures: None  CONSULTS:  None  Time spent: 25- minutes-Greater than 50% of this time was spent in counseling, explanation of diagnosis, planning of further management, and coordination of care.  MEDICATIONS: Scheduled Meds: . acetaminophen  650 mg Oral Once  . calcium-vitamin D  1 tablet Oral BID  . diphenhydrAMINE  25 mg Intravenous Once  . furosemide  20 mg Intravenous Once  . furosemide  40 mg Oral Daily  . Influenza vac split quadrivalent PF  0.5 mL  Intramuscular Tomorrow-1000  . levothyroxine  137 mcg Oral QHS  . metoprolol succinate  50 mg Oral BID  . simvastatin  40 mg  Oral QPM   Continuous Infusions: . sodium chloride     PRN Meds:.acetaminophen, hydrALAZINE, metoprolol tartrate, ondansetron **OR** ondansetron (ZOFRAN) IV, oxyCODONE-acetaminophen, zolpidem   PHYSICAL EXAM: Vital signs: Vitals:   05/14/17 1232 05/14/17 1715 05/14/17 1954 05/15/17 0504  BP: 114/78 123/86 119/75 130/90  Pulse: (!) 108 (!) 122 87 96  Resp: 18 18 18 18   Temp: 98 F (36.7 C) (!) 97.5 F (36.4 C) 98.2 F (36.8 C) 97.8 F (36.6 C)  TempSrc: Oral Oral Oral Oral  SpO2: 93% 97% 96% 97%  Weight:    54.8 kg (120 lb 14.4 oz)  Height:       Filed Weights   05/13/17 2212 05/14/17 0511 05/15/17 0504  Weight: 62.6 kg (138 lb) 61.8 kg (136 lb 3.2 oz) 54.8 kg (120 lb 14.4 oz)   Body mass index is 20.75 kg/m.   General appearance :Awake, alert, not in any distress.  Eyes:, pupils equally reactive to light and accomodation HEENT: Atraumatic and Normocephalic Neck: supple, no JVD. Resp:Good air entry bilaterally CVS: S1 S2 regular GI: Bowel sounds present, Non tender and not distended with no gaurding, rigidity or rebound. Extremities: B/L Lower Ext shows no edema, both legs are warm to touch Neurology:  speech clear,Non focal, sensation is grossly intact. Psychiatric: Normal judgment and insight. Normal mood. Musculoskeletal:No digital cyanosis Skin:No Rash, warm and dry Wounds:N/A  I have personally reviewed following labs and imaging studies  LABORATORY DATA: CBC:  Recent Labs Lab 05/13/17 1652 05/14/17 0320  WBC 5.7 5.5  NEUTROABS 4.3  --   HGB 13.8 12.5  HCT 41.9 37.9  MCV 88.8 89.6  PLT 277 696    Basic Metabolic Panel:  Recent Labs Lab 05/13/17 1652 05/14/17 0320 05/15/17 0636  NA 137 139 137  K 3.7 3.4* 3.9  CL 103 102 100*  CO2 28 29 30   GLUCOSE 94 103* 104*  BUN 19 19 22*  CREATININE 0.96 0.93 0.95  CALCIUM 9.6 9.2 9.8  MG  --   --  1.8    GFR: Estimated Creatinine Clearance: 35.3 mL/min (by C-G formula based on SCr of 0.95  mg/dL).  Liver Function Tests: No results for input(s): AST, ALT, ALKPHOS, BILITOT, PROT, ALBUMIN in the last 168 hours. No results for input(s): LIPASE, AMYLASE in the last 168 hours. No results for input(s): AMMONIA in the last 168 hours.  Coagulation Profile:  Recent Labs Lab 05/13/17 1652 05/14/17 0320 05/14/17 1006 05/15/17 0636  INR 9.91* 10.99* 9.95* 2.00    Cardiac Enzymes: No results for input(s): CKTOTAL, CKMB, CKMBINDEX, TROPONINI in the last 168 hours.  BNP (last 3 results) No results for input(s): PROBNP in the last 8760 hours.  HbA1C: No results for input(s): HGBA1C in the last 72 hours.  CBG: No results for input(s): GLUCAP in the last 168 hours.  Lipid Profile: No results for input(s): CHOL, HDL, LDLCALC, TRIG, CHOLHDL, LDLDIRECT in the last 72 hours.  Thyroid Function Tests:  Recent Labs  05/14/17 0320  TSH 16.315*    Anemia Panel: No results for input(s): VITAMINB12, FOLATE, FERRITIN, TIBC, IRON, RETICCTPCT in the last 72 hours.  Urine analysis:    Component Value Date/Time   COLORURINE YELLOW 05/13/2017 [place] 05/13/2017 1754   LABSPEC 1.009 05/13/2017 1754   PHURINE 6.0 05/13/2017 1754   GLUCOSEU NEGATIVE  05/13/2017 [place] 05/13/2017 1754   BILIRUBINUR NEGATIVE 05/13/2017 1754   KETONESUR NEGATIVE 05/13/2017 1754   PROTEINUR NEGATIVE 05/13/2017 1754   UROBILINOGEN 1.0 02/05/2014 1258   NITRITE NEGATIVE 05/13/2017 1754   LEUKOCYTESUR SMALL (A) 05/13/2017 1754    Sepsis Labs: Lactic Acid, Venous No results found for: LATICACIDVEN  MICROBIOLOGY: No results found for this or any previous visit (from the past 240 hour(s)).  RADIOLOGY STUDIES/RESULTS: Ct Head Wo Contrast  Result Date: 05/13/2017 CLINICAL DATA:  Minor head trauma while on anti coagulation. EXAM: CT HEAD WITHOUT CONTRAST CT CERVICAL SPINE WITHOUT CONTRAST TECHNIQUE: Multidetector CT imaging of the head and cervical spine was performed  following the standard protocol without intravenous contrast. Multiplanar CT image reconstructions of the cervical spine were also generated. COMPARISON:  Brain MRI 02/06/2014 FINDINGS: CT HEAD FINDINGS Brain: No mass lesion, intraparenchymal hemorrhage or extra-axial collection. No evidence of acute cortical infarct. There is periventricular hypoattenuation compatible with chronic microvascular disease. Partially calcified extra-axial mass at the left middle cranial fossa is unchanged compared to the CT of 03/22/2013. Vascular: No hyperdense vessel or unexpected calcification. Skull: Normal visualized skull base, calvarium and extracranial soft tissues. Sinuses/Orbits: No sinus fluid levels or advanced mucosal thickening. No mastoid effusion. Normal orbits. CT CERVICAL SPINE FINDINGS Alignment: No static subluxation. Facets are aligned. Occipital condyles are normally positioned. Skull base and vertebrae: No acute fracture. Soft tissues and spinal canal: No prevertebral fluid or swelling. No visible canal hematoma. Disc levels: Facet arthrosis is worst at left C4-C5. Upper chest: No pneumothorax, pulmonary nodule or pleural effusion. Other: Normal visualized paraspinal cervical soft tissues. IMPRESSION: 1. Sequelae of chronic ischemic microangiopathy without acute intracranial abnormality. 2. Partially calcified left middle cranial fossa extra-axial mass, possibly meningioma, unchanged compared to 03/22/2013. 3. No acute fracture or static subluxation of the cervical spine. Electronically Signed   By: Ulyses Jarred M.D.   On: 05/13/2017 18:35   Ct Cervical Spine Wo Contrast  Result Date: 05/13/2017 CLINICAL DATA:  Minor head trauma while on anti coagulation. EXAM: CT HEAD WITHOUT CONTRAST CT CERVICAL SPINE WITHOUT CONTRAST TECHNIQUE: Multidetector CT imaging of the head and cervical spine was performed following the standard protocol without intravenous contrast. Multiplanar CT image reconstructions of the  cervical spine were also generated. COMPARISON:  Brain MRI 02/06/2014 FINDINGS: CT HEAD FINDINGS Brain: No mass lesion, intraparenchymal hemorrhage or extra-axial collection. No evidence of acute cortical infarct. There is periventricular hypoattenuation compatible with chronic microvascular disease. Partially calcified extra-axial mass at the left middle cranial fossa is unchanged compared to the CT of 03/22/2013. Vascular: No hyperdense vessel or unexpected calcification. Skull: Normal visualized skull base, calvarium and extracranial soft tissues. Sinuses/Orbits: No sinus fluid levels or advanced mucosal thickening. No mastoid effusion. Normal orbits. CT CERVICAL SPINE FINDINGS Alignment: No static subluxation. Facets are aligned. Occipital condyles are normally positioned. Skull base and vertebrae: No acute fracture. Soft tissues and spinal canal: No prevertebral fluid or swelling. No visible canal hematoma. Disc levels: Facet arthrosis is worst at left C4-C5. Upper chest: No pneumothorax, pulmonary nodule or pleural effusion. Other: Normal visualized paraspinal cervical soft tissues. IMPRESSION: 1. Sequelae of chronic ischemic microangiopathy without acute intracranial abnormality. 2. Partially calcified left middle cranial fossa extra-axial mass, possibly meningioma, unchanged compared to 03/22/2013. 3. No acute fracture or static subluxation of the cervical spine. Electronically Signed   By: Ulyses Jarred M.D.   On: 05/13/2017 18:35   Ct Lumbar Spine Wo Contrast  Result Date: 05/13/2017 CLINICAL  DATA:  Back pain EXAM: CT LUMBAR SPINE WITHOUT CONTRAST TECHNIQUE: Multidetector CT imaging of the lumbar spine was performed without intravenous contrast administration. Multiplanar CT image reconstructions were also generated. COMPARISON:  None. FINDINGS: Segmentation: 5 lumbar type vertebrae. Alignment: Normal. Vertebrae: Compression deformity of L1 with approximately 70% height loss is suspected to be chronic.  Superior endplate compression of L2 is also likely chronic. Paraspinal and other soft tissues: There is atherosclerotic calcification of the non aneurysmal abdominal aorta. Disc levels: There is multilevel facet arthrosis, predominantly mild. The disc spaces are largely preserved. There is no spinal canal stenosis or neural foraminal stenosis. IMPRESSION: 1. Compression deformities at L1 and L2 are favored to be chronic. However, in the absence of prior studies for comparison, they remain strictly age indeterminate. 2. No spinal canal or neural foraminal stenosis. 3. Normal alignment of the lumbar spine. Electronically Signed   By: Ulyses Jarred M.D.   On: 05/13/2017 18:24     LOS: 0 days   Oren Binet, MD  [facility] Pager:[phone]  If 7PM-7AM, please contact night-coverage [email or web address] Password [email or web address] 05/15/2017, 9:21 AM

## 2017-05-15 NOTE — Progress Notes (Signed)
ANTICOAGULATION CONSULT NOTE - Initial Consult  Pharmacy Consult for Coumadin Indication: atrial fibrillation  Allergies  Allergen Reactions  . Foradil [Formoterol] Other (See Comments)    Reaction not recalled (??)    Patient Measurements: Height: 5\' 4"  (162.6 cm) Weight: 120 lb 14.4 oz (54.8 kg) (scale a) IBW/kg (Calculated) : 54.7 Heparin Dosing Weight: n/a  Vital Signs: Temp: 97.8 F (36.6 C) (10/04 0504) Temp Source: Oral (10/04 0504) BP: 130/90 (10/04 0504) Pulse Rate: 96 (10/04 0504)  Labs:  Recent Labs  05/13/17 1652 05/14/17 0320 05/14/17 1006 05/15/17 0636  HGB 13.8 12.5  --   --   HCT 41.9 37.9  --   --   PLT 277 272  --   --   LABPROT 78.6* 85.2* 78.9* 22.5*  INR 9.91* 10.99* 9.95* 2.00  CREATININE 0.96 0.93  --  0.95    Estimated Creatinine Clearance: 35.3 mL/min (by C-G formula based on SCr of 0.95 mg/dL).   Medical History: Past Medical History:  Diagnosis Date  . Lower extremity edema   . Permanent atrial fibrillation ([place])   . Pulmonary hypertension ([facility])     Medications:  Scheduled:  . acetaminophen  650 mg Oral Once  . calcium-vitamin D  1 tablet Oral BID  . diphenhydrAMINE  25 mg Intravenous Once  . furosemide  20 mg Intravenous Once  . furosemide  40 mg Oral Daily  . Influenza vac split quadrivalent PF  0.5 mL Intramuscular Tomorrow-1000  . levothyroxine  137 mcg Oral QHS  . metoprolol succinate  50 mg Oral BID  . simvastatin  40 mg Oral QPM  . warfarin  3 mg Oral ONCE-1800  . Warfarin - Pharmacist Dosing Inpatient   Does not apply q1800    Assessment: 83 yo female on chronic Coumadin for afib admitted with supratherapeutic INR.  Unknown reason for elevation, perhaps acetaminophen use.  No overt bleeding or complications noted.    PTA Coumadin dose 2 mg in the morning on Sun/Tues/Wed/Fri/Sat and 4 mg on Mon/Thurs  Received total of 10 mg of vitamin K, and today's INR back down to 2.  Goal of Therapy:  INR 2-3 Monitor  platelets by anticoagulation protocol: Yes   Plan:  1. Coumadin 3 mg x 1 tonight. 2. Suspect may need lower home dose, but will take a few days to overcome effects of Vit K. 3. Daily PT/INR. 4. MD considering transition to Eliquis - would need 2.5 mg BID dosing.  Uvaldo Rising, BCPS  Clinical Pharmacist Pager [phone]  05/15/2017 9:42 AM

## 2017-05-15 NOTE — Evaluation (Signed)
Occupational Therapy Evaluation Patient Details Name: Wendy Flowers MRN: [id number] DOB: Dec 21, 1926 Today's Date: 05/15/2017    History of Present Illness This 83 y.o. female admitted after falling out of bed and c/o worsening LBP.  CT of lumbar spine showed compression fracture of L1-2 of undeterminate age.  she was also found to have supratherapeutic INR.   PMH includes:  memory loss, h/o A-Fib, essential HTN,  possible meningioma (unchanged since 8/14)   Clinical Impression   Pt admitted with above. She demonstrates the below listed deficits and will benefit from continued OT to maximize safety and independence with BADLs.  Pt currently requires min A for ADLs, and fatigues quickly with activity.  She requires min A for functional mobility.   She lives alone with nephew and his wife assisting PRN.  She doesn't think they will be able to stay with her at discharge, and agrees, that she is unsafe to return home alone at this time.  Recommend SNF level rehab.  Will follow.       Follow Up Recommendations  SNF;Supervision/Assistance - 24 hour    Equipment Recommendations       Recommendations for Other Services       Precautions / Restrictions Precautions Precautions: Fall Precaution Comments: Pt was instructed in log rolling       Mobility Bed Mobility Overal bed mobility: Needs Assistance Bed Mobility: Sidelying to Sit;Rolling;Sit to Sidelying Rolling: Min guard Sidelying to sit: Min guard     Sit to sidelying: Min assist General bed mobility comments: assist to lift LEs onto bed.  Pt with heavy reliance on bedrails   Transfers Overall transfer level: Needs assistance   Transfers: Sit to/from Stand;Stand Pivot Transfers Sit to Stand: Min assist Stand pivot transfers: Min assist       General transfer comment: pt requires min A for balance     Balance Overall balance assessment: Needs assistance Sitting-balance support: Feet supported Sitting balance-Leahy Scale:  Good     Standing balance support: Bilateral upper extremity supported;Single extremity supported Standing balance-Leahy Scale: Poor Standing balance comment: heavy reliance on UEs and min A to steady                            ADL either performed or assessed with clinical judgement   ADL Overall ADL's : Needs assistance/impaired Eating/Feeding: Independent   Grooming: Wash/dry hands;Wash/dry face;Oral care;Brushing hair;Minimal assistance;Standing   Upper Body Bathing: Supervision/ safety;Set up;Sitting   Lower Body Bathing: Minimal assistance;Sit to/from stand   Upper Body Dressing : Set up;Supervision/safety;Sitting   Lower Body Dressing: Minimal assistance;Sit to/from stand   Toilet Transfer: Minimal assistance;Ambulation;Comfort height toilet;BSC;RW   Toileting- Clothing Manipulation and Hygiene: Minimal assistance;Sit to/from stand Toileting - Clothing Manipulation Details (indicate cue type and reason): assist for balance      Functional mobility during ADLs: Minimal assistance;Rolling walker General ADL Comments: Pt moves very slowly.  Min A for balance      Vision         Perception     Praxis      Pertinent Vitals/Pain Pain Assessment: 0-10 Pain Score: 5  Pain Location: LBP  Pain Descriptors / Indicators: Aching;Grimacing Pain Intervention(s): Monitored during session;Repositioned;Patient requesting pain meds-RN notified     Hand Dominance Right   Extremity/Trunk Assessment Upper Extremity Assessment Upper Extremity Assessment: Generalized weakness   Lower Extremity Assessment Lower Extremity Assessment: Defer to PT evaluation   Cervical / Trunk Assessment Cervical /  Trunk Assessment: Kyphotic   Communication Communication Communication: No difficulties   Cognition Arousal/Alertness: Awake/alert Behavior During Therapy: WFL for tasks assessed/performed Overall Cognitive Status: No family/caregiver present to determine baseline  cognitive functioning                                 General Comments: Pt with intermittent confusion and disorientation.  Frequently asks if she is in the hospital    General Comments       Exercises     Shoulder Instructions      Home Living Family/patient expects to be discharged to:: Private residence Living Arrangements: Alone Available Help at Discharge: Family;Available PRN/intermittently Type of Home: House Home Access: Stairs to enter [employer] of Steps: 3 at back entrance which is the entrance she uses  Entrance Stairs-Rails: Right;Left;Can reach both Home Layout: Laundry or work area in basement;Two level Alternate Level Stairs-Number of Steps: full flight - doesn't go into basement    Bathroom Shower/Tub: Tub/shower unit;Walk-in shower   Bathroom Toilet: Standard     Home Equipment: Cane - single point;Walker - 2 wheels;Shower seat;Grab bars - tub/shower          Prior Functioning/Environment Level of Independence: Independent        Comments: Pt reports she uses SPC or RW when in community.   She drives.  Nephew manages her yard.   nephew, or his wife occasionally assist her with groceries         OT Problem List: Decreased strength;Decreased activity tolerance;Impaired balance (sitting and/or standing);Decreased knowledge of use of DME or AE;Decreased safety awareness;Decreased knowledge of precautions;Pain      OT Treatment/Interventions: Self-care/ADL training;DME and/or AE instruction;Therapeutic activities;Cognitive remediation/compensation;Patient/family education;Balance training    OT Goals(Current goals can be found in the care plan section) Acute Rehab OT Goals Patient Stated Goal: to have less pain  OT Goal Formulation: With patient Time For Goal Achievement: 05/29/17 Potential to Achieve Goals: Good ADL Goals Pt Will Perform Grooming: with supervision;standing Pt Will Perform Upper Body Bathing: with  supervision;sitting Pt Will Perform Lower Body Bathing: with supervision;sit to/from stand Pt Will Perform Upper Body Dressing: with supervision;sitting Pt Will Perform Lower Body Dressing: with supervision;sit to/from stand Pt Will Transfer to Toilet: with supervision;ambulating;regular height toilet;grab bars;bedside commode Pt Will Perform Toileting - Clothing Manipulation and hygiene: with supervision;sit to/from stand  OT Frequency: Min 2X/week   Barriers to D/C: Decreased caregiver support          Co-evaluation              AM-PAC PT "6 Clicks" Daily Activity     Outcome Measure Help from another person eating meals?: None Help from another person taking care of personal grooming?: A Little Help from another person toileting, which includes using toliet, bedpan, or urinal?: A Little Help from another person bathing (including washing, rinsing, drying)?: A Little Help from another person to put on and taking off regular upper body clothing?: A Little Help from another person to put on and taking off regular lower body clothing?: A Little 6 Click Score: 19   End of Session Equipment Utilized During Treatment: Rolling walker Nurse Communication: Mobility status;Patient requests pain meds  Activity Tolerance: Patient limited by pain;Patient limited by fatigue Patient left: in bed;with call bell/phone within reach;with bed alarm set  OT Visit Diagnosis: Pain;Unsteadiness on feet (R26.81) Pain - part of body:  (back )  Time: 2527-1292 OT Time Calculation (min): 29 min Charges:  OT General Charges $OT Visit: 1 Visit OT Evaluation $OT Eval Moderate Complexity: 1 Mod OT Treatments $Self Care/Home Management : 8-22 mins G-Codes: OT G-codes **NOT FOR INPATIENT CLASS** Functional Assessment Tool Used: AM-PAC 6 Clicks Daily Activity Functional Limitation: Self care Self Care Current Status (T0903): At least 40 percent but less than 60 percent impaired,  limited or restricted Self Care Goal Status (O1499): At least 1 percent but less than 20 percent impaired, limited or restricted   [facility], OTR/L [phone]   Lucille Passy M 05/15/2017, 4:12 PM

## 2017-05-15 NOTE — Progress Notes (Signed)
PT Cancellation Note  Patient Details Name: Wendy Flowers MRN: [id number] DOB: 04/07/1927   Cancelled Treatment:    Reason Eval/Treat Not Completed: Medical issues which prohibited therapy Pt with critical INR of 9.95 this am. Will check back as medically appropriate.  Tarrie Mcmichen B. Migdalia Dk PT, DPT Acute Rehabilitation  [phone] Pager [phone]   [place] 05/15/2017, 8:24 AM

## 2017-05-16 DIAGNOSIS — D329 Benign neoplasm of meninges, unspecified: Secondary | ICD-10-CM | POA: Diagnosis not present

## 2017-05-16 DIAGNOSIS — Z09 Encounter for follow-up examination after completed treatment for conditions other than malignant neoplasm: Secondary | ICD-10-CM | POA: Diagnosis not present

## 2017-05-16 DIAGNOSIS — W19XXXA Unspecified fall, initial encounter: Secondary | ICD-10-CM | POA: Diagnosis not present

## 2017-05-16 DIAGNOSIS — J984 Other disorders of lung: Secondary | ICD-10-CM | POA: Diagnosis not present

## 2017-05-16 DIAGNOSIS — E038 Other specified hypothyroidism: Secondary | ICD-10-CM | POA: Diagnosis not present

## 2017-05-16 DIAGNOSIS — E782 Mixed hyperlipidemia: Secondary | ICD-10-CM | POA: Diagnosis not present

## 2017-05-16 DIAGNOSIS — E785 Hyperlipidemia, unspecified: Secondary | ICD-10-CM | POA: Diagnosis not present

## 2017-05-16 DIAGNOSIS — E039 Hypothyroidism, unspecified: Secondary | ICD-10-CM | POA: Diagnosis not present

## 2017-05-16 DIAGNOSIS — M545 Low back pain: Secondary | ICD-10-CM | POA: Diagnosis not present

## 2017-05-16 DIAGNOSIS — I482 Chronic atrial fibrillation: Secondary | ICD-10-CM | POA: Diagnosis not present

## 2017-05-16 DIAGNOSIS — S32000A Wedge compression fracture of unspecified lumbar vertebra, initial encounter for closed fracture: Secondary | ICD-10-CM | POA: Diagnosis not present

## 2017-05-16 DIAGNOSIS — E034 Atrophy of thyroid (acquired): Secondary | ICD-10-CM | POA: Diagnosis not present

## 2017-05-16 DIAGNOSIS — S32020D Wedge compression fracture of second lumbar vertebra, subsequent encounter for fracture with routine healing: Secondary | ICD-10-CM | POA: Diagnosis not present

## 2017-05-16 DIAGNOSIS — S32010A Wedge compression fracture of first lumbar vertebra, initial encounter for closed fracture: Secondary | ICD-10-CM | POA: Diagnosis not present

## 2017-05-16 DIAGNOSIS — M40209 Unspecified kyphosis, site unspecified: Secondary | ICD-10-CM | POA: Diagnosis not present

## 2017-05-16 DIAGNOSIS — Z7901 Long term (current) use of anticoagulants: Secondary | ICD-10-CM | POA: Diagnosis not present

## 2017-05-16 DIAGNOSIS — I10 Essential (primary) hypertension: Secondary | ICD-10-CM | POA: Diagnosis not present

## 2017-05-16 DIAGNOSIS — I4891 Unspecified atrial fibrillation: Secondary | ICD-10-CM | POA: Diagnosis not present

## 2017-05-16 DIAGNOSIS — M8000XD Age-related osteoporosis with current pathological fracture, unspecified site, subsequent encounter for fracture with routine healing: Secondary | ICD-10-CM | POA: Diagnosis not present

## 2017-05-16 DIAGNOSIS — S32010D Wedge compression fracture of first lumbar vertebra, subsequent encounter for fracture with routine healing: Secondary | ICD-10-CM | POA: Diagnosis not present

## 2017-05-16 DIAGNOSIS — R791 Abnormal coagulation profile: Secondary | ICD-10-CM | POA: Diagnosis not present

## 2017-05-16 DIAGNOSIS — S32019A Unspecified fracture of first lumbar vertebra, initial encounter for closed fracture: Secondary | ICD-10-CM | POA: Diagnosis not present

## 2017-05-16 DIAGNOSIS — I5022 Chronic systolic (congestive) heart failure: Secondary | ICD-10-CM | POA: Diagnosis not present

## 2017-05-16 DIAGNOSIS — Z9181 History of falling: Secondary | ICD-10-CM | POA: Diagnosis not present

## 2017-05-16 LAB — COMPREHENSIVE METABOLIC PANEL
ALBUMIN: 3 g/dL — AB (ref 3.5–5.0)
ALT: 11 U/L — ABNORMAL LOW (ref 14–54)
ANION GAP: 11 (ref 5–15)
AST: 20 U/L (ref 15–41)
Alkaline Phosphatase: 62 U/L (ref 38–126)
BUN: 20 mg/dL (ref 6–20)
CHLORIDE: 98 mmol/L — AB (ref 101–111)
CO2: 29 mmol/L (ref 22–32)
Calcium: 9.8 mg/dL (ref 8.9–10.3)
Creatinine, Ser: 0.94 mg/dL (ref 0.44–1.00)
GFR calc Af Amer: >60 mL/min (ref >60)
GFR calc non Af Amer: 53 mL/min — ABNORMAL LOW (ref >60)
GLUCOSE: 120 mg/dL — AB (ref 65–99)
Potassium: 3.4 mmol/L — ABNORMAL LOW (ref 3.5–5.1)
SODIUM: 138 mmol/L (ref 135–145)
Total Bilirubin: 0.8 mg/dL (ref 0.3–1.2)
Total Protein: 5.7 g/dL — ABNORMAL LOW (ref 6.5–8.1)

## 2017-05-16 LAB — PROTIME-INR
INR: 1.33
Prothrombin Time: 16.3 seconds — ABNORMAL HIGH (ref 11.4–15.2)

## 2017-05-16 MED ORDER — WARFARIN SODIUM 3 MG PO TABS: 3.0000 mg | ORAL_TABLET | Freq: Once | ORAL | Status: DC

## 2017-05-16 MED ORDER — OXYCODONE-ACETAMINOPHEN 5-325 MG PO TABS: 1.0000 | ORAL_TABLET | Freq: Three times a day (TID) | ORAL | 0 refills | Status: DC | PRN

## 2017-05-16 MED ORDER — POTASSIUM CHLORIDE CRYS ER 20 MEQ PO TBCR
40.0000 meq | EXTENDED_RELEASE_TABLET | Freq: Once | ORAL | Status: AC
  Administered 2017-05-16: 40 meq via ORAL
  Filled 2017-05-16: qty 2

## 2017-05-16 MED ORDER — WARFARIN SODIUM 1 MG PO TABS: 3.0000 mg | ORAL_TABLET | Freq: Every day | ORAL | Status: DC

## 2017-05-16 MED ORDER — ACETAMINOPHEN 325 MG PO TABS: 650.0000 mg | ORAL_TABLET | Freq: Four times a day (QID) | ORAL | Status: DC | PRN

## 2017-05-16 NOTE — Clinical Social Work Note (Signed)
CSW facilitated patient discharge including contacting patient family and facility to confirm patient discharge plans. Clinical information faxed to facility and family agreeable with plan. Patient's nephew will transport her by car to [employer]. RN to call report prior to discharge [phone]).  CSW will sign off for now as social work intervention is no longer needed. Please consult [place] again if new needs arise.  Dayton Scrape, [place]

## 2017-05-16 NOTE — Progress Notes (Signed)
Report given to [facility] staff. Patient's nephew will transport to facility.

## 2017-05-16 NOTE — Progress Notes (Signed)
PROGRESS NOTE        PATIENT DETAILS Name: Wendy Flowers Age: 83 y.o. Sex: female Date of Birth: November 06, 1926 Admit Date: 05/13/2017 Admitting Physician Ivor Costa, MD LFY:BOFBP, Thayer Jew, MD  Brief Narrative: Patient is a 83 y.o. female with history of atrial fibrillation on Coumadin, hypertension, chronic systolic heart failure, hypothyroidism admitted following a mechanical fall, and found to have worsening of her chronic low back pain. She was also found to have a supratherapeutic INR without any overt bleeding. Subsequently admitted for further evaluation and treatment, see below for further details  Subjective: No major issues overnight-back pain is well controlled. Denies any chest pain or shortness of breath.  Assessment/Plan: Mechanical fall: Apparently she just slid out of bed-no loss of consciousness-CT head, CT C-spine did not show acute abnormalities, CT of the lumbar spine shows age-indeterminate L1 and L2 fractures. Seen by both physical therapy and occupational therapy, with recommendations for SNF-social worker evaluation in progress.   Supratherapeutic INR: INR now subtherapeutic after vitamin K 2. Not sure why she had such or rapid increase in her INR-both patient and family denies use of recent anti-microbial agent, she does acknowledge using Tylenol more frequently for her back pain, not sure if this was an interaction between Coumadin and Tylenol.  Compression fracture: Seen on CT of the lumbar spine-these are age indeterminate-probably related to underlying osteoporosis. Stable for outpatient follow-up-continue with supportive care in the interim.  Atrial fibrillation: Rate controlled with metoprolol-Coumadin initially helped, INR reversed with vitamin K 2. After extensive discussion with patient and nephew, anticoagulation was restarted-both aware of risk of catastrophic falls and bleeding, but apparently the mechanical fall that she sustained on  admission was her first fall and both would like to continue with anticoagulation. Coumadin has been restarted, plans are to discharge to a skilled nursing facility, where she will need close INR follow-up.  Dyslipidemia: Continue statin   Hypothyroidism: Continue Synthroid at current dosing-her TSH appears to be mildly elevated-unsure when her last dosing was adjusted-she is stable for her TSH to be rechecked in a month or so by her PCP for further adjustment if still elevated then.  Chronic systolic heart failure: Appears euvolemic, continue metoprolol and Lasix.  Hypokalemia: Continue to replete and recheck periodically   Partially calcified meningioma: Stable for outpatient follow-up.  DVT Prophylaxis: Full dose anticoagulation with Coumadin  Code Status: Full code  Family Communication: None at bedside  Disposition Plan: Remain inpatient- SNF on discharge  Antimicrobial agents: Anti-infectives    None      Procedures: None  CONSULTS:  None  Time spent: 25- minutes-Greater than 50% of this time was spent in counseling, explanation of diagnosis, planning of further management, and coordination of care.  MEDICATIONS: Scheduled Meds: . acetaminophen  650 mg Oral Once  . calcium-vitamin D  1 tablet Oral BID  . diphenhydrAMINE  25 mg Intravenous Once  . furosemide  40 mg Oral Daily  . levothyroxine  137 mcg Oral QHS  . metoprolol succinate  50 mg Oral BID  . simvastatin  40 mg Oral QPM  . warfarin  3 mg Oral ONCE-1800  . Warfarin - Pharmacist Dosing Inpatient   Does not apply q1800   Continuous Infusions: . sodium chloride     PRN Meds:.acetaminophen, hydrALAZINE, metoprolol tartrate, ondansetron **OR** ondansetron (ZOFRAN) IV, oxyCODONE-acetaminophen, zolpidem   PHYSICAL EXAM:  Vital signs: Vitals:   05/16/17 0259 05/16/17 0607 05/16/17 0846 05/16/17 1200  BP:  122/71 115/71 110/74  Pulse:  (!) 124 99 (!) 106  Resp:  16 16 16   Temp:  97.9 F (36.6 C)  98.3 F (36.8 C) (!) 97.4 F (36.3 C)  TempSrc:  Oral Oral Oral  SpO2:  95% 99% 96%  Weight: 54.8 kg (120 lb 12.8 oz)     Height:       Filed Weights   05/14/17 0511 05/15/17 0504 05/16/17 0259  Weight: 61.8 kg (136 lb 3.2 oz) 54.8 kg (120 lb 14.4 oz) 54.8 kg (120 lb 12.8 oz)   Body mass index is 20.74 kg/m.   General appearance :Awake, alert, not in any distress.  Eyes:, pupils equally reactive to light and accomodation HEENT: Atraumatic and Normocephalic Neck: supple, no JVD. Resp:Good air entry bilaterally CVS: S1 S2 regular  GI: Bowel sounds present, Non tender and not distended with no gaurding, rigidity or rebound. Extremities: B/L Lower Ext shows no edema Neurology:  speech clear,Non focal Musculoskeletal:No digital cyanosis Skin:No Rash, warm and dry Wounds:N/A  I have personally reviewed following labs and imaging studies  LABORATORY DATA: CBC:  Recent Labs Lab 05/13/17 1652 05/14/17 0320  WBC 5.7 5.5  NEUTROABS 4.3  --   HGB 13.8 12.5  HCT 41.9 37.9  MCV 88.8 89.6  PLT 277 277    Basic Metabolic Panel:  Recent Labs Lab 05/13/17 1652 05/14/17 0320 05/15/17 0636 05/16/17 0444  NA 137 139 137 138  K 3.7 3.4* 3.9 3.4*  CL 103 102 100* 98*  CO2 28 29 30 29   GLUCOSE 94 103* 104* 120*  BUN 19 19 22* 20  CREATININE 0.96 0.93 0.95 0.94  CALCIUM 9.6 9.2 9.8 9.8  MG  --   --  1.8  --     GFR: Estimated Creatinine Clearance: 35.7 mL/min (by C-G formula based on SCr of 0.94 mg/dL).  Liver Function Tests:  Recent Labs Lab 05/16/17 0444  AST 20  ALT 11*  ALKPHOS 62  BILITOT 0.8  PROT 5.7*  ALBUMIN 3.0*   No results for input(s): LIPASE, AMYLASE in the last 168 hours. No results for input(s): AMMONIA in the last 168 hours.  Coagulation Profile:  Recent Labs Lab 05/13/17 1652 05/14/17 0320 05/14/17 1006 05/15/17 0636 05/16/17 0444  INR 9.91* 10.99* 9.95* 2.00 1.33    Cardiac Enzymes: No results for input(s): CKTOTAL, CKMB,  CKMBINDEX, TROPONINI in the last 168 hours.  BNP (last 3 results) No results for input(s): PROBNP in the last 8760 hours.  HbA1C: No results for input(s): HGBA1C in the last 72 hours.  CBG: No results for input(s): GLUCAP in the last 168 hours.  Lipid Profile: No results for input(s): CHOL, HDL, LDLCALC, TRIG, CHOLHDL, LDLDIRECT in the last 72 hours.  Thyroid Function Tests:  Recent Labs  05/14/17 0320  TSH 16.315*    Anemia Panel: No results for input(s): VITAMINB12, FOLATE, FERRITIN, TIBC, IRON, RETICCTPCT in the last 72 hours.  Urine analysis:    Component Value Date/Time   COLORURINE YELLOW 05/13/2017 [place] 05/13/2017 1754   LABSPEC 1.009 05/13/2017 1754   PHURINE 6.0 05/13/2017 1754   GLUCOSEU NEGATIVE 05/13/2017 1754   HGBUR NEGATIVE 05/13/2017 1754   BILIRUBINUR NEGATIVE 05/13/2017 1754   KETONESUR NEGATIVE 05/13/2017 1754   PROTEINUR NEGATIVE 05/13/2017 1754   UROBILINOGEN 1.0 02/05/2014 1258   NITRITE NEGATIVE 05/13/2017 1754   LEUKOCYTESUR SMALL (A) 05/13/2017  1754    Sepsis Labs: Lactic Acid, Venous No results found for: LATICACIDVEN  MICROBIOLOGY: No results found for this or any previous visit (from the past 240 hour(s)).  RADIOLOGY STUDIES/RESULTS: Ct Head Wo Contrast  Result Date: 05/13/2017 CLINICAL DATA:  Minor head trauma while on anti coagulation. EXAM: CT HEAD WITHOUT CONTRAST CT CERVICAL SPINE WITHOUT CONTRAST TECHNIQUE: Multidetector CT imaging of the head and cervical spine was performed following the standard protocol without intravenous contrast. Multiplanar CT image reconstructions of the cervical spine were also generated. COMPARISON:  Brain MRI 02/06/2014 FINDINGS: CT HEAD FINDINGS Brain: No mass lesion, intraparenchymal hemorrhage or extra-axial collection. No evidence of acute cortical infarct. There is periventricular hypoattenuation compatible with chronic microvascular disease. Partially calcified extra-axial  mass at the left middle cranial fossa is unchanged compared to the CT of 03/22/2013. Vascular: No hyperdense vessel or unexpected calcification. Skull: Normal visualized skull base, calvarium and extracranial soft tissues. Sinuses/Orbits: No sinus fluid levels or advanced mucosal thickening. No mastoid effusion. Normal orbits. CT CERVICAL SPINE FINDINGS Alignment: No static subluxation. Facets are aligned. Occipital condyles are normally positioned. Skull base and vertebrae: No acute fracture. Soft tissues and spinal canal: No prevertebral fluid or swelling. No visible canal hematoma. Disc levels: Facet arthrosis is worst at left C4-C5. Upper chest: No pneumothorax, pulmonary nodule or pleural effusion. Other: Normal visualized paraspinal cervical soft tissues. IMPRESSION: 1. Sequelae of chronic ischemic microangiopathy without acute intracranial abnormality. 2. Partially calcified left middle cranial fossa extra-axial mass, possibly meningioma, unchanged compared to 03/22/2013. 3. No acute fracture or static subluxation of the cervical spine. Electronically Signed   By: Ulyses Jarred M.D.   On: 05/13/2017 18:35   Ct Cervical Spine Wo Contrast  Result Date: 05/13/2017 CLINICAL DATA:  Minor head trauma while on anti coagulation. EXAM: CT HEAD WITHOUT CONTRAST CT CERVICAL SPINE WITHOUT CONTRAST TECHNIQUE: Multidetector CT imaging of the head and cervical spine was performed following the standard protocol without intravenous contrast. Multiplanar CT image reconstructions of the cervical spine were also generated. COMPARISON:  Brain MRI 02/06/2014 FINDINGS: CT HEAD FINDINGS Brain: No mass lesion, intraparenchymal hemorrhage or extra-axial collection. No evidence of acute cortical infarct. There is periventricular hypoattenuation compatible with chronic microvascular disease. Partially calcified extra-axial mass at the left middle cranial fossa is unchanged compared to the CT of 03/22/2013. Vascular: No hyperdense  vessel or unexpected calcification. Skull: Normal visualized skull base, calvarium and extracranial soft tissues. Sinuses/Orbits: No sinus fluid levels or advanced mucosal thickening. No mastoid effusion. Normal orbits. CT CERVICAL SPINE FINDINGS Alignment: No static subluxation. Facets are aligned. Occipital condyles are normally positioned. Skull base and vertebrae: No acute fracture. Soft tissues and spinal canal: No prevertebral fluid or swelling. No visible canal hematoma. Disc levels: Facet arthrosis is worst at left C4-C5. Upper chest: No pneumothorax, pulmonary nodule or pleural effusion. Other: Normal visualized paraspinal cervical soft tissues. IMPRESSION: 1. Sequelae of chronic ischemic microangiopathy without acute intracranial abnormality. 2. Partially calcified left middle cranial fossa extra-axial mass, possibly meningioma, unchanged compared to 03/22/2013. 3. No acute fracture or static subluxation of the cervical spine. Electronically Signed   By: Ulyses Jarred M.D.   On: 05/13/2017 18:35   Ct Lumbar Spine Wo Contrast  Result Date: 05/13/2017 CLINICAL DATA:  Back pain EXAM: CT LUMBAR SPINE WITHOUT CONTRAST TECHNIQUE: Multidetector CT imaging of the lumbar spine was performed without intravenous contrast administration. Multiplanar CT image reconstructions were also generated. COMPARISON:  None. FINDINGS: Segmentation: 5 lumbar type vertebrae. Alignment: Normal. Vertebrae: Compression  deformity of L1 with approximately 70% height loss is suspected to be chronic. Superior endplate compression of L2 is also likely chronic. Paraspinal and other soft tissues: There is atherosclerotic calcification of the non aneurysmal abdominal aorta. Disc levels: There is multilevel facet arthrosis, predominantly mild. The disc spaces are largely preserved. There is no spinal canal stenosis or neural foraminal stenosis. IMPRESSION: 1. Compression deformities at L1 and L2 are favored to be chronic. However, in the  absence of prior studies for comparison, they remain strictly age indeterminate. 2. No spinal canal or neural foraminal stenosis. 3. Normal alignment of the lumbar spine. Electronically Signed   By: Ulyses Jarred M.D.   On: 05/13/2017 18:24     LOS: 0 days   Oren Binet, MD  [facility] Pager:[phone]  If 7PM-7AM, please contact night-coverage [email or web address] Password [email or web address] 05/16/2017, 1:33 PM

## 2017-05-16 NOTE — Clinical Social Work Placement (Signed)
   CLINICAL SOCIAL WORK PLACEMENT  NOTE  Date:  05/16/2017  Patient Details  Name: ARLIENE ROSENOW MRN: [id number] Date of Birth: 02-Aug-1927  Clinical Social Work is seeking post-discharge placement for this patient at the [place] level of care (*CSW will initial, date and re-position this form in  chart as items are completed):  Yes   Patient/family provided with [facility] Work Department's list of facilities offering this level of care within the geographic area requested by the patient (or if unable, by the patient's family).  Yes   Patient/family informed of their freedom to choose among providers that offer the needed level of care, that participate in Medicare, Medicaid or managed care program needed by the patient, have an available bed and are willing to accept the patient.  Yes   Patient/family informed of [facility]'s ownership interest in [facility] and [facility], as well as of the fact that they are under no obligation to receive care at these facilities.  PASRR submitted to EDS on 05/16/17     PASRR number received on 05/16/17     Existing PASRR number confirmed on       FL2 transmitted to all facilities in geographic area requested by pt/family on 05/16/17     FL2 transmitted to all facilities within larger geographic area on       Patient informed that his/her managed care company has contracts with or will negotiate with certain facilities, including the following:            Patient/family informed of bed offers received.  Patient chooses bed at       Physician recommends and patient chooses bed at      Patient to be transferred to   on  .  Patient to be transferred to facility by       Patient family notified on   of transfer.  Name of family member notified:        PHYSICIAN Please sign FL2     Additional Comment:    _______________________________________________ Candie Chroman, LCSW 05/16/2017, 12:26  PM

## 2017-05-16 NOTE — Clinical Social Work Note (Addendum)
Patient has to discharge today due to observation status. Per MD, she is stable for discharge today. CSW discussed current bed offers with patient: [facility], [facility], and [facility]. She would like CSW to discuss them with her nephew. She states that she can private pay by putting the balance on her credit card. CSW left voicemail for patient's nephew. [facility] can offer her a private room.  Wendy Flowers, [place]  1:58 pm [facility] is first preference but it is corporate policy that they cannot do private pay until authorization is obtained, therefore Wendy Flowers is unable to do so either. Patient's nephew says that Wendy Flowers is too far and he does not want Wendy Flowers in [employer]. Wendy Flowers and [facility] have not responded yet. CSW spoke with their hospital liaison. She states that they have had issues in the past with insurance denied authorization if patients come early. She will discuss with administration and call CSW back.  Wendy Flowers, Wendy Flowers [phone]  2:35 pm [facility] can take patient today. She will only have to pay 5 days in advance. $245 per day = $1225 up front. If insurance does not approve, she'll have to pay for the full 30 days if not safe to return home. Patient's nephew notified. MD notified.   Wendy Flowers, [place]

## 2017-05-16 NOTE — Evaluation (Signed)
Physical Therapy Evaluation Patient Details Name: Wendy Flowers MRN: [id number] DOB: 1927/08/10 Today's Date: 05/16/2017   History of Present Illness  This 83 y.o. female admitted after falling out of bed and c/o worsening LBP.  CT of lumbar spine showed compression fracture of L1-2 of undeterminate age.  she was also found to have supratherapeutic INR.   PMH includes:  memory loss, h/o A-Fib, essential HTN,  possible meningioma (unchanged since 8/14)  Clinical Impression  Pt admitted secondary to pain incurred during mechanical fall in her home. Pt currently limited in her mobility by back and R hip pain in addition to underlying generalized weakness of her LE and decreased balance. Pt currently requires mod A for bed mobility and minA for transfers and gait of 50 feet with RW. Pt requires bilateral UE support on RW as well as minA for maintaining balance with walking short distances. Pt reports that over the past month she has been getting weaker and not able to get around her house as easily and is relying on the RW more and more. PT educated on need to always use the RW while she is getting stronger. Pt requires skilled PT to improve strength, balance and endurance as well as to progress gait training in a SNF facility so that she can eventually safely navigate in her home environment. PT will continue to follow in the acute setting.     Follow Up Recommendations SNF    Equipment Recommendations  None recommended by PT    Recommendations for Other Services       Precautions / Restrictions Precautions Precautions: Fall Precaution Comments: Pt was instructed in log rolling  Restrictions Weight Bearing Restrictions: No      Mobility  Bed Mobility Overal bed mobility: Needs Assistance Bed Mobility: Sidelying to Sit;Rolling;Sit to Sidelying Rolling: Min guard Sidelying to sit: Mod assist       General bed mobility comments: modA for management of LE off bed and for bringing trunk to  upright  Transfers Overall transfer level: Needs assistance Equipment used: Rolling walker (2 wheeled) Transfers: Sit to/from [facility] Sit to Stand: Min assist         General transfer comment: minA for power up and steadying with RW  Ambulation/Gait Ambulation/Gait assistance: Min assist Ambulation Distance (Feet): 50 Feet Assistive device: Rolling walker (2 wheeled) Gait Pattern/deviations: Step-through pattern;Decreased step length - left;Decreased stance time - right;Decreased weight shift to right;Shuffle Gait velocity: slowed Gait velocity interpretation: Below normal speed for age/gender General Gait Details: minA for steadying with RW, vc for RW management around obstacles, staying within RW and maintaining upright posture, pt with increasing R hip pain with weightbearing limiting her stability with gait         Balance Overall balance assessment: Needs assistance Sitting-balance support: Feet supported Sitting balance-Leahy Scale: Good     Standing balance support: Bilateral upper extremity supported;Single extremity supported Standing balance-Leahy Scale: Poor Standing balance comment: heavy reliance on UEs and min A to steady                              Pertinent Vitals/Pain Pain Assessment: 0-10 Pain Score: 8  Pain Location: LBP  Pain Descriptors / Indicators: Aching;Grimacing Pain Intervention(s): Monitored during session;Limited activity within patient's tolerance    Home Living Family/patient expects to be discharged to:: Private residence Living Arrangements: Alone Available Help at Discharge: Family;Available PRN/intermittently Type of Home: House Home Access: Stairs  to enter Entrance Stairs-Rails: Right;Left;Can reach both Entrance Stairs-Number of Steps: 3 at back entrance which is the entrance she uses  Home Layout: Laundry or work area in basement;Two level Home Equipment: Cane - single point;Walker - 2  wheels;Shower seat;Grab bars - tub/shower      Prior Function Level of Independence: Independent         Comments: Pt reports she uses SPC or RW when in community.   She drives.  Nephew manages her yard.   nephew, or his wife occasionally assist her with groceries      Hand Dominance   Dominant Hand: Right    Extremity/Trunk Assessment   Upper Extremity Assessment Upper Extremity Assessment: Defer to OT evaluation    Lower Extremity Assessment Lower Extremity Assessment: RLE deficits/detail;Generalized weakness RLE Deficits / Details: R hip ROM limited by pain with movement, R LE strength grossly 3/5  RLE: Unable to fully assess due to pain    Cervical / Trunk Assessment Cervical / Trunk Assessment: Kyphotic  Communication   Communication: No difficulties  Cognition Arousal/Alertness: Awake/alert Behavior During Therapy: WFL for tasks assessed/performed Overall Cognitive Status: No family/caregiver present to determine baseline cognitive functioning                                 General Comments: Pt with intermittent confusion and disorientation.  Frequently asks if she is in the hospital       General Comments General comments (skin integrity, edema, etc.): VSS        Assessment/Plan    PT Assessment Patient needs continued PT services  PT Problem List Decreased strength;Decreased range of motion;Decreased activity tolerance;Decreased balance;Decreased mobility;Decreased safety awareness;Pain       PT Treatment Interventions      PT Goals (Current goals can be found in the Care Plan section)  Acute Rehab PT Goals Patient Stated Goal: to have less pain  PT Goal Formulation: With patient Time For Goal Achievement: 05/30/17 Potential to Achieve Goals: Good    Frequency Min 3X/week   Barriers to discharge Decreased caregiver support alone with PRN family support       AM-PAC PT "6 Clicks" Daily Activity  Outcome Measure Difficulty  turning over in bed (including adjusting bedclothes, sheets and blankets)?: A Lot Difficulty moving from lying on back to sitting on the side of the bed? : Unable Difficulty sitting down on and standing up from a chair with arms (e.g., wheelchair, bedside commode, etc,.)?: Unable Help needed moving to and from a bed to chair (including a wheelchair)?: A Little Help needed walking in hospital room?: A Little Help needed climbing 3-5 steps with a railing? : A Lot 6 Click Score: 12    End of Session Equipment Utilized During Treatment: Gait belt Activity Tolerance: Patient limited by pain Patient left: in chair;with call bell/phone within reach;with chair alarm set Nurse Communication: Mobility status PT Visit Diagnosis: Unsteadiness on feet (R26.81);Other abnormalities of gait and mobility (R26.89);Muscle weakness (generalized) (M62.81);History of falling (Z91.81);Difficulty in walking, not elsewhere classified (R26.2);Pain Pain - Right/Left: Right Pain - part of body: Hip (back)    Time: 6270-3500 PT Time Calculation (min) (ACUTE ONLY): 23 min   Charges:   PT Evaluation $PT Eval Moderate Complexity: 1 Mod PT Treatments $Gait Training: 8-22 mins   PT G Codes:        Tashona Calk B. Migdalia Dk PT, DPT Acute Rehabilitation  [phone] Pager [phone])  [place] 05/16/2017, 10:25 AM

## 2017-05-16 NOTE — NC FL2 (Signed)
[place] LEVEL OF CARE SCREENING TOOL     IDENTIFICATION  Patient Name: Wendy Flowers Birthdate: 1926/12/03 Sex: female Admission Date (Current Location): 05/13/2017  [facility] and [place] Number:  Kathleen Argue (Says she lives 1 mile from [facility] line.)   Facility and Address:  The . [facility], [place] [address] [place], [place] [address]      Provider Number: [id number]  Attending Physician Name and Address:  Jonetta Osgood, MD  Relative Name and Phone Number:       Current Level of Care: Hospital Recommended Level of Care: [place] Prior Approval Number:    Date Approved/Denied:   PASRR Number: [id number]  Discharge Plan: SNF    Current Diagnoses: Patient Active Problem List   Diagnosis Date Noted  . Hypothyroidism 05/14/2017  . Fall 05/13/2017  . Supratherapeutic INR 05/13/2017  . Meningioma ([place]) 05/13/2017  . Chronic systolic CHF (congestive heart failure) ([place]) 05/13/2017  . HLD (hyperlipidemia) 05/13/2017  . Compression fracture of lumbar vertebra ([place]) 05/13/2017  . Chronic atrial fibrillation with RVR (HCC)   . Restrictive lung disease due to kyphoscoliosis 06/02/2014  . Chronic anticoagulation 02/28/2014  . Kyphosis 02/28/2014  . Community acquired pneumonia 02/05/2014  . CAP (community acquired pneumonia) 02/05/2014  . Memory changes 05/15/2013  . Vertigo 03/18/2013  . Essential hypertension 03/22/2010  . Pulmonary arterial hypertension ([place]) 03/22/2010  . ALLERGIC RHINITIS 03/22/2010  . DYSPNEA ON EXERTION 03/22/2010  . Permanent atrial fibrillation ([place]) 03/21/2010    Orientation RESPIRATION BLADDER Height & Weight     Self, Time, Situation, Place  Normal Continent Weight: 120 lb 12.8 oz (54.8 kg) (scale a) Height:  5\' 4"  (162.6 cm)  BEHAVIORAL SYMPTOMS/MOOD NEUROLOGICAL BOWEL NUTRITION STATUS   (None)  (None) Continent Diet (Heart healthy)  AMBULATORY STATUS COMMUNICATION OF NEEDS Skin    Limited Assist Verbally Bruising                       Personal Care Assistance Level of Assistance  Bathing, Feeding, Dressing Bathing Assistance: Limited assistance Feeding assistance: Independent Dressing Assistance: Limited assistance     Functional Limitations Info  Sight, Hearing, Speech Sight Info: Adequate Hearing Info: Adequate Speech Info: Adequate    SPECIAL CARE FACTORS FREQUENCY  PT (By licensed PT), OT (By licensed OT), Blood pressure     PT Frequency: 5 x week OT Frequency: 5 x week            Contractures Contractures Info: Not present    Additional Factors Info  Code Status, Allergies Code Status Info: Full Allergies Info: Foradil (Formoterol)           Current Medications (05/16/2017):  This is the current hospital active medication list Current Facility-Administered Medications  Medication Dose Route Frequency Provider Last Rate Last Dose  . 0.9 %  sodium chloride infusion   Intravenous Once Jonetta Osgood, MD      . acetaminophen (TYLENOL) tablet 650 mg  650 mg Oral Q6H PRN Ivor Costa, MD      . acetaminophen (TYLENOL) tablet 650 mg  650 mg Oral Once Jonetta Osgood, MD      . calcium-vitamin D (OSCAL WITH D) 500-200 MG-UNIT per tablet 1 tablet  1 tablet Oral BID Ivor Costa, MD   1 tablet at 05/16/17 0841  . diphenhydrAMINE (BENADRYL) injection 25 mg  25 mg Intravenous Once Ghimire, Henreitta Leber, MD      . furosemide (LASIX)  tablet 40 mg  40 mg Oral Daily Ivor Costa, MD   40 mg at 05/16/17 0841  . hydrALAZINE (APRESOLINE) injection 5 mg  5 mg Intravenous Q2H PRN Ivor Costa, MD      . levothyroxine (SYNTHROID, LEVOTHROID) tablet 137 mcg  137 mcg Oral Gordan Payment, MD   137 mcg at 05/15/17 2142  . metoprolol succinate (TOPROL-XL) 24 hr tablet 50 mg  50 mg Oral BID Ivor Costa, MD   50 mg at 05/16/17 0841  . metoprolol tartrate (LOPRESSOR) injection 5 mg  5 mg Intravenous Q5 min PRN Ivor Costa, MD      . ondansetron [facility]) tablet 4 mg   4 mg Oral Q6H PRN Ivor Costa, MD       Or  . ondansetron (ZOFRAN) injection 4 mg  4 mg Intravenous Q6H PRN Ivor Costa, MD      . oxyCODONE-acetaminophen (PERCOCET/ROXICET) 5-325 MG per tablet 1 tablet  1 tablet Oral Q4H PRN Ivor Costa, MD   1 tablet at 05/16/17 1033  . simvastatin (ZOCOR) tablet 40 mg  40 mg Oral QPM Ivor Costa, MD   40 mg at 05/15/17 1711  . warfarin (COUMADIN) tablet 3 mg  3 mg Oral ONCE-1800 Bajbus, Lauren D, RPH      . Warfarin - Pharmacist Dosing Inpatient   Does not apply q1800 Pat Patrick, RPH   1 each at 05/15/17 1800  . zolpidem (AMBIEN) tablet 5 mg  5 mg Oral QHS PRN Ivor Costa, MD         Discharge Medications: Please see discharge summary for a list of discharge medications.  Relevant Imaging Results:  Relevant Lab Results:   Additional Information SS#: [id number]  Candie Chroman, LCSW

## 2017-05-16 NOTE — Clinical Social Work Placement (Signed)
   CLINICAL SOCIAL WORK PLACEMENT  NOTE  Date:  05/16/2017  Patient Details  Name: Wendy Flowers MRN: [id number] Date of Birth: May 24, 1927  Clinical Social Work is seeking post-discharge placement for this patient at the [place] level of care (*CSW will initial, date and re-position this form in  chart as items are completed):  Yes   Patient/family provided with [place] Work Department's list of facilities offering this level of care within the geographic area requested by the patient (or if unable, by the patient's family).  Yes   Patient/family informed of their freedom to choose among providers that offer the needed level of care, that participate in Medicare, Medicaid or managed care program needed by the patient, have an available bed and are willing to accept the patient.  Yes   Patient/family informed of [place]'s ownership interest in [facility] and [facility], as well as of the fact that they are under no obligation to receive care at these facilities.  PASRR submitted to EDS on 05/16/17     PASRR number received on 05/16/17     Existing PASRR number confirmed on       FL2 transmitted to all facilities in geographic area requested by pt/family on 05/16/17     FL2 transmitted to all facilities within larger geographic area on       Patient informed that his/her managed care company has contracts with or will negotiate with certain facilities, including the following:        Yes   Patient/family informed of bed offers received.  Patient chooses bed at [facility]     Physician recommends and patient chooses bed at      Patient to be transferred to [facility] on 05/16/17.  Patient to be transferred to facility by [facility]     Patient family notified on 05/16/17 of transfer.  Name of family member notified:  Max Kalman Shan     PHYSICIAN       Additional Comment:     _______________________________________________ Candie Chroman, LCSW 05/16/2017, 2:56 PM

## 2017-05-16 NOTE — Progress Notes (Signed)
[place] for Coumadin Indication: atrial fibrillation  Allergies  Allergen Reactions  . Foradil [Formoterol] Other (See Comments)    Reaction not recalled (??)    Patient Measurements: Height: 5\' 4"  (162.6 cm) Weight: 120 lb 12.8 oz (54.8 kg) (scale a) IBW/kg (Calculated) : 54.7  Vital Signs: Temp: 97.9 F (36.6 C) (10/05 0607) Temp Source: Oral (10/05 0607) BP: 122/71 (10/05 0607) Pulse Rate: 124 (10/05 0607)  Labs:  Recent Labs  05/13/17 1652 05/14/17 0320 05/14/17 1006 05/15/17 0636 05/16/17 0444  HGB 13.8 12.5  --   --   --   HCT 41.9 37.9  --   --   --   PLT 277 272  --   --   --   LABPROT 78.6* 85.2* 78.9* 22.5* 16.3*  INR 9.91* 10.99* 9.95* 2.00 1.33  CREATININE 0.96 0.93  --  0.95 0.94    Estimated Creatinine Clearance: 35.7 mL/min (by C-G formula based on SCr of 0.94 mg/dL).  Assessment: 83 yo female on chronic Coumadin for afib admitted with supratherapeutic INR.  Unknown reason for elevation, perhaps acetaminophen use.  No overt bleeding or complications noted.    PTA Coumadin dose 2mg  in the morning on Sun/Tues/Wed/Fri/Sat and 4mg  on Mon/Thurs  Received total of 10 mg of vitamin K (5mg  on 10/3, 5mg  on 10/4), and warfarin was restarted last evening.  Goal of Therapy:  INR 2-3 Monitor platelets by anticoagulation protocol: Yes   Plan:  - Coumadin 3 mg x 1 tonight. - Suspect may need lower home dose (possibly 2mg  daily with INR follow up a couple days after discharge), but will take a few days to overcome effects of Vitamin K. - Daily PT/INR, CBC in the morning   Zohra Clavel D. Verdene Creson, PharmD, [place] Clinical Pharmacist Pager: [phone] Clinical Phone for 05/16/2017 until 3:30pm: [phone] If after 3:30pm, please call main pharmacy at [phone] 05/16/2017 8:36 AM

## 2017-05-16 NOTE — Discharge Summary (Signed)
PATIENT DETAILS Name: Wendy Flowers Age: 83 y.o. Sex: female Date of Birth: November 26, 1926 MRN: [id number]. Admitting Physician: Ivor Costa, MD [id number], Thayer Jew, MD  Admit Date: 05/13/2017 Discharge date: 05/16/2017  Recommendations for Outpatient Follow-up:  1. Follow up with PCP in 1-2 weeks 2. Please obtain BMP/CBC in one week 3. Please check INR in 2 days-Coumadin has been restarted. 4. Please repeat TSH in the next few weeks, and continue to adjust dosing of Synthroid accordingly  Admitted From:  Home  Disposition: SNF   Home Health: No  Equipment/Devices: None  Discharge Condition: Stable  CODE STATUS: FULL CODE  Diet recommendation:  Heart Healthy   Brief Summary: See H&P, Labs, Consult and Test reports for all details in brief, Patient is a 83 y.o. female with history of atrial fibrillation on Coumadin, hypertension, chronic systolic heart failure, hypothyroidism admitted following a mechanical fall, and found to have worsening of her chronic low back pain. She was also found to have a supratherapeutic INR without any overt bleeding. Subsequently admitted for further evaluation and treatment, see below for further details  Brief Hospital Course: Mechanical fall: Apparently she just slid out of bed-no loss of consciousness-CT head, CT C-spine did not show acute abnormalities, CT of the lumbar spine shows age-indeterminate L1 and L2 fractures. Seen by both physical therapy and occupational therapy, with recommendations for SNF  Supratherapeutic INR: INR now subtherapeutic after vitamin K 2. Not sure why she had such or rapid increase in her INR-both patient and family denies use of recent anti-microbial agent, she does acknowledge using Tylenol more frequently for her back pain, not sure if this was an interaction between Coumadin and Tylenol.  Compression fracture: Seen on CT of the lumbar spine-these are age indeterminate-probably related to underlying osteoporosis.  Stable for outpatient follow-up-continue with supportive care in the interim. Continue with Prolia-follow-up with PCP.  Atrial fibrillation: Rate controlled with metoprolol-Coumadin initially helped, INR reversed with vitamin K 2. After extensive discussion with patient and nephew, anticoagulation was restarted-both aware of risk of catastrophic falls and bleeding, but apparently the mechanical fall that she sustained on admission was her first fall and both would like to continue with anticoagulation. Coumadin has been restarted, plans are to discharge to a skilled nursing facility, where she will need close INR follow-up. Please recheck INR in the next 2 days and adjust dosing of Coumadin accordingly.  Dyslipidemia: Continue statin   Hypothyroidism: Continue Synthroid at current dosing-her TSH appears to be mildly elevated-unsure when her last dosing was adjusted-she is stable for her TSH to be rechecked in a month or so by her PCP for further adjustment if still elevated then.  Chronic systolic heart failure: Appears euvolemic, continue metoprolol and Lasix.  Hypokalemia: Continue to replete and recheck periodically   Partially calcified meningioma: Stable for outpatient follow-up.  Procedures/Studies: None  Discharge Diagnoses:  Principal Problem:   Fall Active Problems:   Essential hypertension   Pulmonary arterial hypertension (HCC)   Permanent atrial fibrillation (HCC)   Chronic anticoagulation   Supratherapeutic INR   Meningioma (HCC)   Chronic systolic CHF (congestive heart failure) (HCC)   HLD (hyperlipidemia)   Compression fracture of lumbar vertebra (HCC)   Hypothyroidism   Discharge Instructions:  Activity:  As tolerated with Full fall precautions use walker/cane & assistance as needed   Discharge Instructions    Call MD for:  severe uncontrolled pain    Complete by:  As directed    Diet - low sodium heart  healthy    Complete by:  As directed     Increase activity slowly    Complete by:  As directed      Allergies as of 05/16/2017      Reactions   Foradil [formoterol] Other (See Comments)   Reaction not recalled (??)      Medication List    TAKE these medications   acetaminophen 325 MG tablet Commonly known as:  TYLENOL Take 2 tablets (650 mg total) by mouth every 6 (six) hours as needed for mild pain, fever or headache (for lower back pain). What changed:  how much to take  reasons to take this   Calcium + D3 600-200 MG-UNIT Tabs Take 1 tablet by mouth 2 (two) times daily.   furosemide 40 MG tablet Commonly known as:  LASIX Take 40 mg by mouth daily.   metoprolol succinate 50 MG 24 hr tablet Commonly known as:  TOPROL-XL Take 50 mg by mouth 2 (two) times daily.   oxyCODONE-acetaminophen 5-325 MG tablet Commonly known as:  PERCOCET/ROXICET Take 1 tablet by mouth every 8 (eight) hours as needed for moderate pain.   PROLIA 60 MG/ML Soln injection Generic drug:  denosumab Inject 60 mg into the skin every 6 (six) months.   simvastatin 40 MG tablet Commonly known as:  ZOCOR Take 40 mg by mouth every evening.   SYNTHROID 137 MCG tablet Generic drug:  levothyroxine Take 137 mcg by mouth at bedtime.   warfarin 1 MG tablet Commonly known as:  COUMADIN Take 3 tablets (3 mg total) by mouth daily at 6 PM. What changed:  medication strength  how much to take  when to take this  additional instructions      Follow-up Information    Anda Kraft, MD. Schedule an appointment as soon as possible for a visit in 1 week(s).   Specialty:  Endocrinology Contact information: [address] [place] [address] [phone]        Croitoru, Dani Gobble, MD. Schedule an appointment as soon as possible for a visit in 2 week(s).   Specialty:  Cardiology Contact information: [address] [place] [address] [phone]          Allergies  Allergen Reactions  . Foradil  [Formoterol] Other (See Comments)    Reaction not recalled (??)    Consultations:   None   Other Procedures/Studies: Ct Head Wo Contrast  Result Date: 05/13/2017 CLINICAL DATA:  Minor head trauma while on anti coagulation. EXAM: CT HEAD WITHOUT CONTRAST CT CERVICAL SPINE WITHOUT CONTRAST TECHNIQUE: Multidetector CT imaging of the head and cervical spine was performed following the standard protocol without intravenous contrast. Multiplanar CT image reconstructions of the cervical spine were also generated. COMPARISON:  Brain MRI 02/06/2014 FINDINGS: CT HEAD FINDINGS Brain: No mass lesion, intraparenchymal hemorrhage or extra-axial collection. No evidence of acute cortical infarct. There is periventricular hypoattenuation compatible with chronic microvascular disease. Partially calcified extra-axial mass at the left middle cranial fossa is unchanged compared to the CT of 03/22/2013. Vascular: No hyperdense vessel or unexpected calcification. Skull: Normal visualized skull base, calvarium and extracranial soft tissues. Sinuses/Orbits: No sinus fluid levels or advanced mucosal thickening. No mastoid effusion. Normal orbits. CT CERVICAL SPINE FINDINGS Alignment: No static subluxation. Facets are aligned. Occipital condyles are normally positioned. Skull base and vertebrae: No acute fracture. Soft tissues and spinal canal: No prevertebral fluid or swelling. No visible canal hematoma. Disc levels: Facet arthrosis is worst at left C4-C5. Upper chest: No  pneumothorax, pulmonary nodule or pleural effusion. Other: Normal visualized paraspinal cervical soft tissues. IMPRESSION: 1. Sequelae of chronic ischemic microangiopathy without acute intracranial abnormality. 2. Partially calcified left middle cranial fossa extra-axial mass, possibly meningioma, unchanged compared to 03/22/2013. 3. No acute fracture or static subluxation of the cervical spine. Electronically Signed   By: Ulyses Jarred M.D.   On: 05/13/2017  18:35   Ct Cervical Spine Wo Contrast  Result Date: 05/13/2017 CLINICAL DATA:  Minor head trauma while on anti coagulation. EXAM: CT HEAD WITHOUT CONTRAST CT CERVICAL SPINE WITHOUT CONTRAST TECHNIQUE: Multidetector CT imaging of the head and cervical spine was performed following the standard protocol without intravenous contrast. Multiplanar CT image reconstructions of the cervical spine were also generated. COMPARISON:  Brain MRI 02/06/2014 FINDINGS: CT HEAD FINDINGS Brain: No mass lesion, intraparenchymal hemorrhage or extra-axial collection. No evidence of acute cortical infarct. There is periventricular hypoattenuation compatible with chronic microvascular disease. Partially calcified extra-axial mass at the left middle cranial fossa is unchanged compared to the CT of 03/22/2013. Vascular: No hyperdense vessel or unexpected calcification. Skull: Normal visualized skull base, calvarium and extracranial soft tissues. Sinuses/Orbits: No sinus fluid levels or advanced mucosal thickening. No mastoid effusion. Normal orbits. CT CERVICAL SPINE FINDINGS Alignment: No static subluxation. Facets are aligned. Occipital condyles are normally positioned. Skull base and vertebrae: No acute fracture. Soft tissues and spinal canal: No prevertebral fluid or swelling. No visible canal hematoma. Disc levels: Facet arthrosis is worst at left C4-C5. Upper chest: No pneumothorax, pulmonary nodule or pleural effusion. Other: Normal visualized paraspinal cervical soft tissues. IMPRESSION: 1. Sequelae of chronic ischemic microangiopathy without acute intracranial abnormality. 2. Partially calcified left middle cranial fossa extra-axial mass, possibly meningioma, unchanged compared to 03/22/2013. 3. No acute fracture or static subluxation of the cervical spine. Electronically Signed   By: Ulyses Jarred M.D.   On: 05/13/2017 18:35   Ct Lumbar Spine Wo Contrast  Result Date: 05/13/2017 CLINICAL DATA:  Back pain EXAM: CT LUMBAR  SPINE WITHOUT CONTRAST TECHNIQUE: Multidetector CT imaging of the lumbar spine was performed without intravenous contrast administration. Multiplanar CT image reconstructions were also generated. COMPARISON:  None. FINDINGS: Segmentation: 5 lumbar type vertebrae. Alignment: Normal. Vertebrae: Compression deformity of L1 with approximately 70% height loss is suspected to be chronic. Superior endplate compression of L2 is also likely chronic. Paraspinal and other soft tissues: There is atherosclerotic calcification of the non aneurysmal abdominal aorta. Disc levels: There is multilevel facet arthrosis, predominantly mild. The disc spaces are largely preserved. There is no spinal canal stenosis or neural foraminal stenosis. IMPRESSION: 1. Compression deformities at L1 and L2 are favored to be chronic. However, in the absence of prior studies for comparison, they remain strictly age indeterminate. 2. No spinal canal or neural foraminal stenosis. 3. Normal alignment of the lumbar spine. Electronically Signed   By: Ulyses Jarred M.D.   On: 05/13/2017 18:24      TODAY-DAY OF DISCHARGE:  Subjective:   Wendy Flowers today has no headache,no chest abdominal pain,no new weakness tingling or numbness, feels much better wants to go home today.   Objective:   Blood pressure 110/74, pulse (!) 106, temperature (!) 97.4 F (36.3 C), temperature source Oral, resp. rate 16, height 5\' 4"  (1.626 m), weight 54.8 kg (120 lb 12.8 oz), SpO2 96 %.  Intake/Output Summary (Last 24 hours) at 05/16/17 1353 Last data filed at 05/16/17 1300  Gross per 24 hour  Intake  720 ml  Output             1401 ml  Net             -681 ml   Filed Weights   05/14/17 0511 05/15/17 0504 05/16/17 0259  Weight: 61.8 kg (136 lb 3.2 oz) 54.8 kg (120 lb 14.4 oz) 54.8 kg (120 lb 12.8 oz)    Exam: Awake Alert, Oriented *3, No new F.N deficits, Normal affect Kelly.AT,PERRAL Supple Neck,No JVD, No cervical lymphadenopathy appriciated.    Symmetrical Chest wall movement, Good air movement bilaterally, CTAB RRR,No Gallops,Rubs or new Murmurs, No Parasternal Heave +ve B.Sounds, Abd Soft, Non tender, No organomegaly appriciated, No rebound -guarding or rigidity. No Cyanosis, Clubbing or edema, No new Rash or bruise   PERTINENT RADIOLOGIC STUDIES: Ct Head Wo Contrast  Result Date: 05/13/2017 CLINICAL DATA:  Minor head trauma while on anti coagulation. EXAM: CT HEAD WITHOUT CONTRAST CT CERVICAL SPINE WITHOUT CONTRAST TECHNIQUE: Multidetector CT imaging of the head and cervical spine was performed following the standard protocol without intravenous contrast. Multiplanar CT image reconstructions of the cervical spine were also generated. COMPARISON:  Brain MRI 02/06/2014 FINDINGS: CT HEAD FINDINGS Brain: No mass lesion, intraparenchymal hemorrhage or extra-axial collection. No evidence of acute cortical infarct. There is periventricular hypoattenuation compatible with chronic microvascular disease. Partially calcified extra-axial mass at the left middle cranial fossa is unchanged compared to the CT of 03/22/2013. Vascular: No hyperdense vessel or unexpected calcification. Skull: Normal visualized skull base, calvarium and extracranial soft tissues. Sinuses/Orbits: No sinus fluid levels or advanced mucosal thickening. No mastoid effusion. Normal orbits. CT CERVICAL SPINE FINDINGS Alignment: No static subluxation. Facets are aligned. Occipital condyles are normally positioned. Skull base and vertebrae: No acute fracture. Soft tissues and spinal canal: No prevertebral fluid or swelling. No visible canal hematoma. Disc levels: Facet arthrosis is worst at left C4-C5. Upper chest: No pneumothorax, pulmonary nodule or pleural effusion. Other: Normal visualized paraspinal cervical soft tissues. IMPRESSION: 1. Sequelae of chronic ischemic microangiopathy without acute intracranial abnormality. 2. Partially calcified left middle cranial fossa extra-axial  mass, possibly meningioma, unchanged compared to 03/22/2013. 3. No acute fracture or static subluxation of the cervical spine. Electronically Signed   By: Ulyses Jarred M.D.   On: 05/13/2017 18:35   Ct Cervical Spine Wo Contrast  Result Date: 05/13/2017 CLINICAL DATA:  Minor head trauma while on anti coagulation. EXAM: CT HEAD WITHOUT CONTRAST CT CERVICAL SPINE WITHOUT CONTRAST TECHNIQUE: Multidetector CT imaging of the head and cervical spine was performed following the standard protocol without intravenous contrast. Multiplanar CT image reconstructions of the cervical spine were also generated. COMPARISON:  Brain MRI 02/06/2014 FINDINGS: CT HEAD FINDINGS Brain: No mass lesion, intraparenchymal hemorrhage or extra-axial collection. No evidence of acute cortical infarct. There is periventricular hypoattenuation compatible with chronic microvascular disease. Partially calcified extra-axial mass at the left middle cranial fossa is unchanged compared to the CT of 03/22/2013. Vascular: No hyperdense vessel or unexpected calcification. Skull: Normal visualized skull base, calvarium and extracranial soft tissues. Sinuses/Orbits: No sinus fluid levels or advanced mucosal thickening. No mastoid effusion. Normal orbits. CT CERVICAL SPINE FINDINGS Alignment: No static subluxation. Facets are aligned. Occipital condyles are normally positioned. Skull base and vertebrae: No acute fracture. Soft tissues and spinal canal: No prevertebral fluid or swelling. No visible canal hematoma. Disc levels: Facet arthrosis is worst at left C4-C5. Upper chest: No pneumothorax, pulmonary nodule or pleural effusion. Other: Normal visualized paraspinal cervical soft tissues. IMPRESSION: 1. Sequelae of  chronic ischemic microangiopathy without acute intracranial abnormality. 2. Partially calcified left middle cranial fossa extra-axial mass, possibly meningioma, unchanged compared to 03/22/2013. 3. No acute fracture or static subluxation of the  cervical spine. Electronically Signed   By: Ulyses Jarred M.D.   On: 05/13/2017 18:35   Ct Lumbar Spine Wo Contrast  Result Date: 05/13/2017 CLINICAL DATA:  Back pain EXAM: CT LUMBAR SPINE WITHOUT CONTRAST TECHNIQUE: Multidetector CT imaging of the lumbar spine was performed without intravenous contrast administration. Multiplanar CT image reconstructions were also generated. COMPARISON:  None. FINDINGS: Segmentation: 5 lumbar type vertebrae. Alignment: Normal. Vertebrae: Compression deformity of L1 with approximately 70% height loss is suspected to be chronic. Superior endplate compression of L2 is also likely chronic. Paraspinal and other soft tissues: There is atherosclerotic calcification of the non aneurysmal abdominal aorta. Disc levels: There is multilevel facet arthrosis, predominantly mild. The disc spaces are largely preserved. There is no spinal canal stenosis or neural foraminal stenosis. IMPRESSION: 1. Compression deformities at L1 and L2 are favored to be chronic. However, in the absence of prior studies for comparison, they remain strictly age indeterminate. 2. No spinal canal or neural foraminal stenosis. 3. Normal alignment of the lumbar spine. Electronically Signed   By: Ulyses Jarred M.D.   On: 05/13/2017 18:24     PERTINENT LAB RESULTS: CBC:  Recent Labs  05/13/17 1652 05/14/17 0320  WBC 5.7 5.5  HGB 13.8 12.5  HCT 41.9 37.9  PLT 277 272   CMET CMP     Component Value Date/Time   NA 138 05/16/2017 0444   K 3.4 (L) 05/16/2017 0444   CL 98 (L) 05/16/2017 0444   CO2 29 05/16/2017 0444   GLUCOSE 120 (H) 05/16/2017 0444   BUN 20 05/16/2017 0444   CREATININE 0.94 05/16/2017 0444   CALCIUM 9.8 05/16/2017 0444   PROT 5.7 (L) 05/16/2017 0444   ALBUMIN 3.0 (L) 05/16/2017 0444   AST 20 05/16/2017 0444   ALT 11 (L) 05/16/2017 0444   ALKPHOS 62 05/16/2017 0444   BILITOT 0.8 05/16/2017 0444   GFRNONAA 53 (L) 05/16/2017 0444   GFRAA >60 05/16/2017 0444    GFR Estimated  Creatinine Clearance: 35.7 mL/min (by C-G formula based on SCr of 0.94 mg/dL). No results for input(s): LIPASE, AMYLASE in the last 72 hours. No results for input(s): CKTOTAL, CKMB, CKMBINDEX, TROPONINI in the last 72 hours. Invalid input(s): POCBNP No results for input(s): DDIMER in the last 72 hours. No results for input(s): HGBA1C in the last 72 hours. No results for input(s): CHOL, HDL, LDLCALC, TRIG, CHOLHDL, LDLDIRECT in the last 72 hours.  Recent Labs  05/14/17 0320  TSH 16.315*   No results for input(s): VITAMINB12, FOLATE, FERRITIN, TIBC, IRON, RETICCTPCT in the last 72 hours. Coags:  Recent Labs  05/15/17 0636 05/16/17 0444  INR 2.00 1.33   Microbiology: No results found for this or any previous visit (from the past 240 hour(s)).  FURTHER DISCHARGE INSTRUCTIONS:  Get Medicines reviewed and adjusted: Please take all your medications with you for your next visit with your Primary MD  Laboratory/radiological data: Please request your Primary MD to go over all hospital tests and procedure/radiological results at the follow up, please ask your Primary MD to get all Hospital records sent to his/her office.  In some cases, they will be blood work, cultures and biopsy results pending at the time of your discharge. Please request that your primary care M.D. goes through all the records of your hospital  data and follows up on these results.  Also Note the following: If you experience worsening of your admission symptoms, develop shortness of breath, life threatening emergency, suicidal or homicidal thoughts you must seek medical attention immediately by calling 911 or calling your MD immediately  if symptoms less severe.  You must read complete instructions/literature along with all the possible adverse reactions/side effects for all the Medicines you take and that have been prescribed to you. Take any new Medicines after you have completely understood and accpet all the  possible adverse reactions/side effects.   Do not drive when taking Pain medications or sleeping medications (Benzodaizepines)  Do not take more than prescribed Pain, Sleep and Anxiety Medications. It is not advisable to combine anxiety,sleep and pain medications without talking with your primary care practitioner  Special Instructions: If you have smoked or chewed Tobacco  in the last 2 yrs please stop smoking, stop any regular Alcohol  and or any Recreational drug use.  Wear Seat belts while driving.  Please note: You were cared for by a hospitalist during your hospital stay. Once you are discharged, your primary care physician will handle any further medical issues. Please note that NO REFILLS for any discharge medications will be authorized once you are discharged, as it is imperative that you return to your primary care physician (or establish a relationship with a primary care physician if you do not have one) for your post hospital discharge needs so that they can reassess your need for medications and monitor your lab values.  Total Time spent coordinating discharge including counseling, education and face to face time equals 35 minutes.  Signed: Zakai Gonyea 05/16/2017 1:53 PM

## 2017-05-16 NOTE — Clinical Social Work Note (Signed)
Clinical Social Work Assessment  Patient Details  Name: Wendy Flowers MRN: [id number] Date of Birth: [birth year]  Date of referral:  05/16/17               Reason for consult:  Facility Placement, Discharge Planning                Permission sought to share information with:  Chartered certified accountant granted to share information::  Yes, Verbal Permission Granted  Name::        Agency::  SNF's  Relationship::     Contact Information:     Housing/Transportation Living arrangements for the past 2 months:  Single Family Home Source of Information:  Patient, Medical Team Patient Interpreter Needed:  None Criminal Activity/Legal Involvement Pertinent to Current Situation/Hospitalization:  No - Comment as needed Significant Relationships:  Other Family Members Lives with:  Self Do you feel safe going back to the place where you live?  Yes Need for family participation in patient care:  Yes (Comment)  Care giving concerns:  PT recommending SNF once medically stable for discharge.   Social Worker assessment / plan:  CSW met with patient. No supports at bedside. CSW introduced role and explained that PT recommendations would be discussed. At first patient asked if there was any alternative to SNF but after further discussion regarding staff's concerns with her returning home alone at this time, she is agreeable to SNF placement. [place] [employer] is not in network with many facilities in [place]. Patient reports that she lives one mile from the [facility] line so referral sent to those SNF's as well. No further concerns. CSW encouraged patient to contact CSW as needed. CSW will continue to follow patient for support and facilitate discharge to SNF once medically stable.  Employment status:  Retired Nurse, adult PT Recommendations:  [place] / Referral to community resources:  [place]  Patient/Family's Response to care:  Patient agreeable to SNF placement. Patient's nephew supportive and involved in patient's care. Patient appreciated social work intervention.  Patient/Family's Understanding of and Emotional Response to Diagnosis, Current Treatment, and Prognosis:  Patient has a good understanding of the reason for admission and her need for rehab prior to returning home. Patient appears happy with hospital care.  Emotional Assessment Appearance:  Appears stated age Attitude/Demeanor/Rapport:  Other (Pleasant) Affect (typically observed):  Accepting, Appropriate, Calm, Pleasant Orientation:  Oriented to Self, Oriented to Place, Oriented to  Time, Oriented to Situation Alcohol / Substance use:  Never Used Psych involvement (Current and /or in the community):  No (Comment)  Discharge Needs  Concerns to be addressed:  Care Coordination Readmission within the last 30 days:  No Current discharge risk:  Dependent with Mobility, Lives alone Barriers to Discharge:  Ship broker, Continued Medical Work up   Candie Chroman, LCSW 05/16/2017, 12:24 PM

## 2017-05-19 ENCOUNTER — Encounter: Payer: Self-pay | Admitting: Adult Health

## 2017-05-19 ENCOUNTER — Non-Acute Institutional Stay (SKILLED_NURSING_FACILITY): Payer: Medicare HMO | Admitting: Adult Health

## 2017-05-19 DIAGNOSIS — E034 Atrophy of thyroid (acquired): Secondary | ICD-10-CM | POA: Diagnosis not present

## 2017-05-19 DIAGNOSIS — S32010D Wedge compression fracture of first lumbar vertebra, subsequent encounter for fracture with routine healing: Secondary | ICD-10-CM | POA: Diagnosis not present

## 2017-05-19 DIAGNOSIS — I482 Chronic atrial fibrillation, unspecified: Secondary | ICD-10-CM

## 2017-05-19 DIAGNOSIS — I10 Essential (primary) hypertension: Secondary | ICD-10-CM

## 2017-05-19 DIAGNOSIS — I5022 Chronic systolic (congestive) heart failure: Secondary | ICD-10-CM

## 2017-05-19 DIAGNOSIS — E782 Mixed hyperlipidemia: Secondary | ICD-10-CM | POA: Diagnosis not present

## 2017-05-19 LAB — POCT INR: INR: 1.6 — AB (ref 0.9–1.1)

## 2017-05-19 LAB — PROTIME-INR: Protime: 18.4 — AB (ref 10.0–13.8)

## 2017-05-19 NOTE — Progress Notes (Signed)
Addition of G-codes to chart    2017-05-30 1100  PT G-Codes **NOT FOR INPATIENT CLASS**  Functional Assessment Tool Used AM-PAC 6 Clicks Basic Mobility  Functional Limitation Mobility: Walking and moving around  Mobility: Walking and Moving Around Current Status (J0093) CL  Mobility: Walking and Moving Around Goal Status (G1829) Simona Huh B. Migdalia Dk PT, DPT Acute Rehabilitation  [phone] Pager [phone]

## 2017-05-19 NOTE — Progress Notes (Signed)
Location:   [place] Room Number: [address] RUEAV of Service:  SNF (31)   CODE STATUS: Full Code  Allergies  Allergen Reactions  . Foradil [Formoterol] Other (See Comments)    Reaction not recalled (??)    Chief Complaint  Patient presents with  . Hospitalization Follow-up    Hospital follow up    HPI:  She has been hospitalized from 05-13-17 through 05-16-17 for a mechanical fall at home with worsening back pain. She slid out of her bed. The ct of her head and c-spine was negative for abnormalities. The ct of her lumbar spine did demonstrate an age-indeterminate L1 and L2 fractures.  Her INR was supra therapeutic. She does have afib for which she had been taking coumadin her family and she have decided to continue coumadin at this time after being educated on the pros and cons. She is here for short term rehab; at this time her goal is go to home; although that may not be possible; she could benefit from assisted living. She denies chest pain shortness of breath; she does a sore area on the right side of her back there are no nursing concerns at this time  Past Medical History:  Diagnosis Date  . Lower extremity edema   . Permanent atrial fibrillation ([place])   . Pulmonary hypertension ([place])     Past Surgical History:  Procedure Laterality Date  . BREAST LUMPECTOMY  4098   left  . CARDIAC CATHETERIZATION  06/13/2010   R & LHC: no CAD  . CATARACT EXTRACTION  2008   bilateral  . [place]   left  . US ECHOCARDIOGRAPHY  02/04/2012   LA mod. dilated,RA mild-mod dilated,mild MR,mod. TR    Social History   Social History  . Marital status: Widowed    Spouse name: N/A  . Number of children: N/A  . Years of education: N/A   Occupational History  . Not on file.   Social History Main Topics  . Smoking status: Never Smoker  . Smokeless tobacco: Never Used  . Alcohol use Yes     Comment: seldom; 03/17/2013-occasionally drinks a glass of wine before bed  .  Drug use: No  . Sexual activity: Not on file   Other Topics Concern  . Not on file   Social History Narrative  . No narrative on file   Family History  Problem Relation Age of Onset  . Heart attack Mother   . Heart attack Father   . Diabetes Brother   . Cancer Brother       VITAL SIGNS BP 118/60   Pulse 77   Temp 98.1 F (36.7 C)   Resp 18   Ht 5\' 4"  (1.626 m)   Wt 123 lb 12.8 oz (56.2 kg)   SpO2 97%   BMI 21.25 kg/m   Patient's Medications  New Prescriptions   No medications on file  Previous Medications   ACETAMINOPHEN (TYLENOL) 325 MG TABLET    Take 2 tablets (650 mg total) by mouth every 6 (six) hours as needed for mild pain, fever or headache (for lower back pain).   ATORVASTATIN (LIPITOR) 20 MG TABLET    Take 20 mg by mouth at bedtime.   CALCIUM CARB-CHOLECALCIFEROL (CALCIUM + D3) 600-200 MG-UNIT TABS    Take 1 tablet by mouth 2 (two) times daily.   FUROSEMIDE (LASIX) 40 MG TABLET    Take 40 mg by mouth daily.    METOPROLOL SUCCINATE (TOPROL-XL)  50 MG 24 HR TABLET    Take 50 mg by mouth 2 (two) times daily.   OXYCODONE-ACETAMINOPHEN (PERCOCET/ROXICET) 5-325 MG TABLET    Take 1 tablet by mouth every 8 (eight) hours as needed for moderate pain.   PROLIA 60 MG/ML SOLN INJECTION    Inject 60 mg into the skin every 6 (six) months.   SYNTHROID 137 MCG TABLET    Take 137 mcg by mouth at bedtime.    WARFARIN (COUMADIN) 1 MG TABLET    Take 3 tablets (3 mg total) by mouth daily at 6 PM.  Modified Medications   No medications on file  Discontinued Medications   SIMVASTATIN (ZOCOR) 40 MG TABLET    Take 40 mg by mouth every evening.     SIGNIFICANT DIAGNOSTIC EXAMS  TODAY:   05-13-17: ct of head and cervical spine:  1. Sequelae of chronic ischemic microangiopathy without acute intracranial abnormality. 2. Partially calcified left middle cranial fossa extra-axial mass, possibly meningioma, unchanged compared to 03/22/2013. 3. No acute fracture or static subluxation of  the cervical spine.  05-13-17: ct of lumbar spine:  1. Compression deformities at L1 and L2 are favored to be chronic. However, in the absence of prior studies for comparison, they remain strictly age indeterminate. 2. No spinal canal or neural foraminal stenosis. 3. Normal alignment of the lumbar spine.  LABS REVIEWED TODAY:   05-13-17: wbc 5.7; hgb 138; hct 41.9; mcv 88.8; plt 277; glucose 94; bun 19; creat 0.96; k+ 3.7; na++ 137; ca 9.6 INR 9.91 05-14-17: BNP 298.6; tsh 16.315; INR 9.945 05-15-17: glucose 104; bun 22; creat 0.95; k+ 3.9; na++ 137; ca 9.8; INR 2.00 05-16-17: glucose 120; bun 20; creat 0.94; k+ 3.4; na++ 138; ca 9.8   Review of Systems  Constitutional: Negative for malaise/fatigue.  Respiratory: Negative for cough and shortness of breath.   Cardiovascular: Negative for chest pain, palpitations and leg swelling.  Gastrointestinal: Negative for abdominal pain, constipation and heartburn.  Musculoskeletal: Positive for myalgias. Negative for back pain and joint pain.       Has chronic back pain.   Skin: Negative.   Neurological: Negative for dizziness.  Psychiatric/Behavioral: The patient is not nervous/anxious.    Physical Exam  Constitutional: No distress.  Thin   HENT:  Head: Normocephalic.  Eyes: Conjunctivae are normal.  Neck: Normal range of motion. Neck supple. No thyromegaly present.  Cardiovascular: Normal rate, normal heart sounds and intact distal pulses.   Heart rate irregular   Pulmonary/Chest: Effort normal and breath sounds normal. No respiratory distress.  Abdominal: Soft. Bowel sounds are normal. She exhibits no distension. There is no tenderness.  Musculoskeletal: Normal range of motion. She exhibits no edema.  Has kyphosis   Lymphadenopathy:    She has no cervical adenopathy.  Neurological: She is alert.  Skin: Skin is warm and dry. She is not diaphoretic.  Psychiatric: She has a normal mood and affect.     ASSESSMENT/ PLAN:  TODAY:  1.  Chronic atrial fib with RVR; her heart rate is stable; will continue toprol xl 50 mg twice daily for rate coumdin 3 mg every pm will check INR in am  Will follow up with cardiology   2. Hypertension: stable b/p 118/60: will continue toprol xl 50 mg twice daily   3. Chronic systolic heart failure: is stable: EF 45-50% (02-06-17): will continue lasix 40 mg daily  4. Hypothyroidism: unchanged: tsh 16.315: will continue synthroid at 137 mcg daily will recheck tsh in 4  weeks. Will need to see endocrinology    5. Dyslipidemia: stable will continue lipitor 20 mg daily  6. Osteoporosis: without change: will continue calcium supplement and prolia every 6 months  7. Lumbar compression fracture: of L1 and L2  Is stable: will continue therapy as directed will continue percocet 5/325 mg every 6 hours as needed for pain   8. 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 We will attempt to wean resident as apropriate   Ok Edwards NP [facility]  Contact [phone] Monday through Friday 8am- 5pm  After hours call [phone]

## 2017-05-20 DIAGNOSIS — M545 Low back pain: Secondary | ICD-10-CM | POA: Diagnosis not present

## 2017-05-20 DIAGNOSIS — Z9181 History of falling: Secondary | ICD-10-CM | POA: Diagnosis not present

## 2017-05-22 ENCOUNTER — Encounter: Payer: Self-pay | Admitting: Internal Medicine

## 2017-05-22 ENCOUNTER — Non-Acute Institutional Stay (SKILLED_NURSING_FACILITY): Payer: Medicare HMO | Admitting: Internal Medicine

## 2017-05-22 DIAGNOSIS — I10 Essential (primary) hypertension: Secondary | ICD-10-CM

## 2017-05-22 DIAGNOSIS — S32010D Wedge compression fracture of first lumbar vertebra, subsequent encounter for fracture with routine healing: Secondary | ICD-10-CM

## 2017-05-22 DIAGNOSIS — I482 Chronic atrial fibrillation, unspecified: Secondary | ICD-10-CM

## 2017-05-22 DIAGNOSIS — I5022 Chronic systolic (congestive) heart failure: Secondary | ICD-10-CM

## 2017-05-22 DIAGNOSIS — E034 Atrophy of thyroid (acquired): Secondary | ICD-10-CM

## 2017-05-22 DIAGNOSIS — S32020D Wedge compression fracture of second lumbar vertebra, subsequent encounter for fracture with routine healing: Secondary | ICD-10-CM

## 2017-05-22 DIAGNOSIS — M8000XD Age-related osteoporosis with current pathological fracture, unspecified site, subsequent encounter for fracture with routine healing: Secondary | ICD-10-CM | POA: Diagnosis not present

## 2017-05-22 DIAGNOSIS — E782 Mixed hyperlipidemia: Secondary | ICD-10-CM

## 2017-05-22 DIAGNOSIS — M545 Low back pain: Secondary | ICD-10-CM | POA: Diagnosis not present

## 2017-05-22 DIAGNOSIS — Z9181 History of falling: Secondary | ICD-10-CM | POA: Diagnosis not present

## 2017-05-22 NOTE — Progress Notes (Signed)
Patient ID: Wendy Flowers, female   DOB: 1926-08-19, 83 y.o.   MRN: [id number]      HISTORY AND PHYSICAL   DATE:   May 22, 2017  Location:   [facility] Room Number: 128 N OMVEH of Service: SNF (31)   Extended Emergency Contact Information Primary Emergency Contact: Burton,Charles Address: [place], [place] Phone: [phone] Work Phone: [phone] Relation: Nephew  Advanced Directive information Does Patient Have a Medical Advance Directive?: No, Would patient like information on creating a medical advance directive?: No - Patient declined, Does patient want to make changes to medical advance directive?: No - Patient declined  Chief Complaint  Patient presents with  . New Admit To SNF    Admission    HPI:  83 yo female seen today as a new admission into SNF following hospital stay for mechanical fall, L1-L2 compression fx age indeterminate, supratherapeutic INR, meningioma, osteoporosis, afib, hyperlipidemia, hypothyroidism, chronic sHF. INR 9.91 on admission. She presented to the ED after fall OOB. CT head and C spine neg for acute process. CT L soine revealed age indeterminate L1 and L2 fx. Therapy recommended SNF. For INR 9.91, she was given x 2 doses Vitamin K-->INR 10.99-->1.33. TSH 16.315; K 3.4 at d/c. She presents to SNF for short term rehab.  Today she reports no concerns. Pain controlled. Appetite ok and sleeps well. Tolerating tx. No falls since SNF admission. No bleeding.  Chronic afib - rate controlled on toprol XL 50 mg twice daily; she takes  coumadin 3 mg every pm for anticoagulation. GOAL INR 2-3. Last INR 1.4 on 05/15/17 and to be recked on 05/27/17  Hypertension - BP stable on toprol succ XL 50 mg twice daily; also takes lasix   Chronic systolic heart failure - reduced EF 45-50% (02-06-17). takes lasix 40 mg daily and metoprolol succ XL '50mg'$  BID  Hypothyroidism - uncontrolled. TSH  16.315. Takes synthroid 137 mcg daily. To be followed by endocrinology.     Dyslipidemia - stable on lipitor 20 mg daily  Osteoporosis - with vertebral compression fx at L1-L2. Takes calcium supplement and receives prolia injection every 6 months  Restrictive lung disease  - 2/2 kyphosis. Stable  Hx meningioma - stable  Past Medical History:  Diagnosis Date  . Lower extremity edema   . Permanent atrial fibrillation ([place])   . Pulmonary hypertension ([place])     Past Surgical History:  Procedure Laterality Date  . BREAST LUMPECTOMY  6503   left  . CARDIAC CATHETERIZATION  06/13/2010   R & LHC: no CAD  . CATARACT EXTRACTION  2008   bilateral  . [place]   left  . US ECHOCARDIOGRAPHY  02/04/2012   LA mod. dilated,RA mild-mod dilated,mild MR,mod. TR    Patient Care Team: Anda Kraft, MD as PCP - General  Social History   Social History  . Marital status: Widowed    Spouse name: N/A  . Number of children: N/A  . Years of education: N/A   Occupational History  . Not on file.   Social History Main Topics  . Smoking status: Never Smoker  . Smokeless tobacco: Never Used  . Alcohol use Yes     Comment: seldom; 03/17/2013-occasionally drinks a glass of wine before bed  . Drug use: No  . Sexual activity: Not on file   Other Topics Concern  . Not on file  Social History Narrative  . No narrative on file     reports that she has never smoked. She has never used smokeless tobacco. She reports that she drinks alcohol. She reports that she does not use drugs.  Family History  Problem Relation Age of Onset  . Heart attack Mother   . Heart attack Father   . Diabetes Brother   . Cancer Brother    Family Status  Relation Status  . Mother Deceased  . Father Deceased  . Brother Alive  . Brother Deceased  . MGM Deceased  . MGF Deceased  . PGM Deceased  . PGF Deceased  . Brother (Not Specified)  . Brother (Not Specified)    Immunization History    Administered Date(s) Administered  . Influenza Whole 05/12/2010  . Influenza, High Dose Seasonal PF 05/15/2017    Allergies  Allergen Reactions  . Foradil [Formoterol] Other (See Comments)    Reaction not recalled (??)    Medications: Patient's Medications  New Prescriptions   No medications on file  Previous Medications   ACETAMINOPHEN (TYLENOL) 325 MG TABLET    Take 650 mg by mouth 3 (three) times daily.   ATORVASTATIN (LIPITOR) 20 MG TABLET    Take 20 mg by mouth at bedtime.   CALCIUM CARB-CHOLECALCIFEROL (CALCIUM + D3) 600-200 MG-UNIT TABS    Take 1 tablet by mouth 2 (two) times daily.   FUROSEMIDE (LASIX) 40 MG TABLET    Take 40 mg by mouth daily.    METOPROLOL SUCCINATE (TOPROL-XL) 50 MG 24 HR TABLET    Take 50 mg by mouth 2 (two) times daily.   OXYCODONE-ACETAMINOPHEN (PERCOCET/ROXICET) 5-325 MG TABLET    Take 1 tablet by mouth every 8 (eight) hours as needed for moderate pain.   PROLIA 60 MG/ML SOLN INJECTION    Inject 60 mg into the skin every 6 (six) months.   SYNTHROID 137 MCG TABLET    Take 137 mcg by mouth at bedtime.    TRAMADOL (ULTRAM) 50 MG TABLET    Give 0.5 tablet by mouth every 8 hours as needed for pain   WARFARIN (COUMADIN) 4 MG TABLET    Take 4 mg by mouth daily.  Modified Medications   No medications on file  Discontinued Medications   ACETAMINOPHEN (TYLENOL) 325 MG TABLET    Take 2 tablets (650 mg total) by mouth every 6 (six) hours as needed for mild pain, fever or headache (for lower back pain).   WARFARIN (COUMADIN) 1 MG TABLET    Take 3 tablets (3 mg total) by mouth daily at 6 PM.    Review of Systems  Musculoskeletal: Positive for arthralgias and back pain.  All other systems reviewed and are negative.   Vitals:   05/22/17 0952  BP: 118/64  Pulse: 80  Resp: 18  Temp: 98.1 F (36.7 C)  SpO2: 97%  Weight: 121 lb (54.9 kg)  Height: 5\' 4"  (1.626 m)   Body mass index is 20.77 kg/m.  Physical Exam  Constitutional: She is oriented to  person, place, and time. She appears well-developed.  Sitting up in bed asleep but easily awakened, frail appearing  HENT:  Mouth/Throat: Oropharynx is clear and moist. No oropharyngeal exudate.  MMM; no oral thrush  Eyes: Pupils are equal, round, and reactive to light. No scleral icterus.  Neck: Neck supple. Carotid bruit is not present. No tracheal deviation present. No thyromegaly present.  Cardiovascular: Normal rate and intact distal pulses.  An irregularly irregular rhythm  present. Exam reveals no gallop and no friction rub.   Murmur (1/6 SEM) heard. No LE edema b/l. no calf TTP.   Pulmonary/Chest: Effort normal and breath sounds normal. No stridor. No respiratory distress. She has no wheezes. She has no rales.  Abdominal: Soft. Normal appearance and bowel sounds are normal. She exhibits distension. She exhibits no mass. There is no hepatomegaly. There is no tenderness. There is no rigidity, no rebound and no guarding. No hernia.  Musculoskeletal: She exhibits edema, tenderness (lumbar spinous process) and deformity (thoracic kyphosis).  Lymphadenopathy:    She has no cervical adenopathy.  Neurological: She is alert and oriented to person, place, and time.  Skin: Skin is warm and dry. No rash noted.  Psychiatric: She has a normal mood and affect. Her behavior is normal. Judgment and thought content normal.     Labs reviewed: Abstract on 05/19/2017  Component Date Value Ref Range Status  . INR 05/19/2017 1.6* 0.9 - 1.1 Final  . Protime 05/19/2017 18.4* 10.0 - 13.8 Final  Admission on 05/13/2017, Discharged on 05/16/2017  Component Date Value Ref Range Status  . Sodium 05/13/2017 137  135 - 145 mmol/L Final  . Potassium 05/13/2017 3.7  3.5 - 5.1 mmol/L Final  . Chloride 05/13/2017 103  101 - 111 mmol/L Final  . CO2 05/13/2017 28  22 - 32 mmol/L Final  . Glucose, Bld 05/13/2017 94  65 - 99 mg/dL Final  . BUN 05/13/2017 19  6 - 20 mg/dL Final  . Creatinine, Ser 05/13/2017 0.96   0.44 - 1.00 mg/dL Final  . Calcium 05/13/2017 9.6  8.9 - 10.3 mg/dL Final  . GFR calc non Af Amer 05/13/2017 51* >60 mL/min Final  . GFR calc Af Amer 05/13/2017 59* >60 mL/min Final   Comment: (NOTE) The eGFR has been calculated using the CKD EPI equation. This calculation has not been validated in all clinical situations. eGFR's persistently <60 mL/min signify possible Chronic Kidney Disease.   . Anion gap 05/13/2017 6  5 - 15 Final  . WBC 05/13/2017 5.7  4.0 - 10.5 K/uL Final  . RBC 05/13/2017 4.72  3.87 - 5.11 MIL/uL Final  . Hemoglobin 05/13/2017 13.8  12.0 - 15.0 g/dL Final  . HCT 05/13/2017 41.9  36.0 - 46.0 % Final  . MCV 05/13/2017 88.8  78.0 - 100.0 fL Final  . MCH 05/13/2017 29.2  26.0 - 34.0 pg Final  . MCHC 05/13/2017 32.9  30.0 - 36.0 g/dL Final  . RDW 05/13/2017 15.3  11.5 - 15.5 % Final  . Platelets 05/13/2017 277  150 - 400 K/uL Final  . Neutrophils Relative % 05/13/2017 76  % Final  . Neutro Abs 05/13/2017 4.3  1.7 - 7.7 K/uL Final  . Lymphocytes Relative 05/13/2017 15  % Final  . Lymphs Abs 05/13/2017 0.8  0.7 - 4.0 K/uL Final  . Monocytes Relative 05/13/2017 7  % Final  . Monocytes Absolute 05/13/2017 0.4  0.1 - 1.0 K/uL Final  . Eosinophils Relative 05/13/2017 1  % Final  . Eosinophils Absolute 05/13/2017 0.1  0.0 - 0.7 K/uL Final  . Basophils Relative 05/13/2017 1  % Final  . Basophils Absolute 05/13/2017 0.0  0.0 - 0.1 K/uL Final  . Prothrombin Time 05/13/2017 78.6* 11.4 - 15.2 seconds Final  . INR 05/13/2017 9.91*  Final   Comment: REPEATED TO VERIFY CRITICAL RESULT CALLED TO, READ BACK BY AND VERIFIED WITH: Berlinda Last RN [id number] GREEN R   . Color,  Urine 05/13/2017 YELLOW  YELLOW Final  . APPearance 05/13/2017 CLEAR  CLEAR Final  . Specific Gravity, Urine 05/13/2017 1.009  1.005 - 1.030 Final  . pH 05/13/2017 6.0  5.0 - 8.0 Final  . Glucose, UA 05/13/2017 NEGATIVE  NEGATIVE mg/dL Final  . Hgb urine dipstick 05/13/2017 NEGATIVE  NEGATIVE Final  .  Bilirubin Urine 05/13/2017 NEGATIVE  NEGATIVE Final  . Ketones, ur 05/13/2017 NEGATIVE  NEGATIVE mg/dL Final  . Protein, ur 05/13/2017 NEGATIVE  NEGATIVE mg/dL Final  . Nitrite 05/13/2017 NEGATIVE  NEGATIVE Final  . Leukocytes, UA 05/13/2017 SMALL* NEGATIVE Final  . RBC / HPF 05/13/2017 0-5  0 - 5 RBC/hpf Final  . WBC, UA 05/13/2017 6-30  0 - 5 WBC/hpf Final  . Bacteria, UA 05/13/2017 RARE* NONE SEEN Final  . Squamous Epithelial / LPF 05/13/2017 0-5* NONE SEEN Final  . Hyaline Casts, UA 05/13/2017 PRESENT   Final  . Prothrombin Time 05/14/2017 85.2* 11.4 - 15.2 seconds Final   REPEATED TO VERIFY  . INR 05/14/2017 10.99*  Final   Comment: REPEATED TO VERIFY CRITICAL RESULT CALLED TO, READ BACK BY AND VERIFIED WITH: E.DODOO,RN [id number] 05/14/17 M.CAMPBELL   . B Natriuretic Peptide 05/14/2017 298.6* 0.0 - 100.0 pg/mL Final  . TSH 05/14/2017 16.315* 0.350 - 4.500 uIU/mL Final   Performed by a 3rd Generation assay with a functional sensitivity of <=0.01 uIU/mL.  [place] Kitchen Sodium 05/14/2017 139  135 - 145 mmol/L Final  . Potassium 05/14/2017 3.4* 3.5 - 5.1 mmol/L Final  . Chloride 05/14/2017 102  101 - 111 mmol/L Final  . CO2 05/14/2017 29  22 - 32 mmol/L Final  . Glucose, Bld 05/14/2017 103* 65 - 99 mg/dL Final  . BUN 05/14/2017 19  6 - 20 mg/dL Final  . Creatinine, Ser 05/14/2017 0.93  0.44 - 1.00 mg/dL Final  . Calcium 05/14/2017 9.2  8.9 - 10.3 mg/dL Final  . GFR calc non Af Amer 05/14/2017 53* >60 mL/min Final  . GFR calc Af Amer 05/14/2017 >60  >60 mL/min Final   Comment: (NOTE) The eGFR has been calculated using the CKD EPI equation. This calculation has not been validated in all clinical situations. eGFR's persistently <60 mL/min signify possible Chronic Kidney Disease.   . Anion gap 05/14/2017 8  5 - 15 Final  . WBC 05/14/2017 5.5  4.0 - 10.5 K/uL Final  . RBC 05/14/2017 4.23  3.87 - 5.11 MIL/uL Final  . Hemoglobin 05/14/2017 12.5  12.0 - 15.0 g/dL Final  . HCT 05/14/2017 37.9  36.0 -  46.0 % Final  . MCV 05/14/2017 89.6  78.0 - 100.0 fL Final  . MCH 05/14/2017 29.6  26.0 - 34.0 pg Final  . MCHC 05/14/2017 33.0  30.0 - 36.0 g/dL Final  . RDW 05/14/2017 15.5  11.5 - 15.5 % Final  . Platelets 05/14/2017 272  150 - 400 K/uL Final  . Prothrombin Time 05/14/2017 78.9* 11.4 - 15.2 seconds Final   REPEATED TO VERIFY  . INR 05/14/2017 9.95*  Final   Comment: REPEATED TO VERIFY CRITICAL RESULT CALLED TO, READ BACK BY AND VERIFIED WITH: Michele Rockers RN [id number] 05/14/2017 BY MACEDA,J.    . ABO/RH(D) 05/14/2017 A POS   Final  . Antibody Screen 05/14/2017 NEG   Final  . Sample Expiration 05/14/2017 05/17/2017   Final  . ABO/RH(D) 05/14/2017 A POS   Final  . Unit Number 05/14/2017 [id number]   Final  . Blood Component Type 05/14/2017 THAWED PLASMA   Final  .  Unit division 05/14/2017 00   Final  . Status of Unit 05/14/2017 REL FROM [facility]   Final  . Transfusion Status 05/14/2017 OK TO TRANSFUSE   Final  . Unit Number 05/14/2017 [id number]   Final  . Blood Component Type 05/14/2017 THAWED PLASMA   Final  . Unit division 05/14/2017 00   Final  . Status of Unit 05/14/2017 REL FROM [facility]   Final  . Transfusion Status 05/14/2017 OK TO TRANSFUSE   Final  . ISSUE DATE / TIME 05/14/2017 [id number]   Final  . Blood Product Unit Number 05/14/2017 [id number]   Final  . PRODUCT CODE 05/14/2017 [id number]   Final  . Unit Type and Rh 05/14/2017 6200   Final  . Blood Product Expiration Date 05/14/2017 [id number]   Final  . Blood Product Unit Number 05/14/2017 [id number]   Final  . Unit Type and Rh 05/14/2017 6200   Final  . Blood Product Expiration Date 05/14/2017 [id number]   Final  . Prothrombin Time 05/15/2017 22.5* 11.4 - 15.2 seconds Final  . INR 05/15/2017 2.00   Final  . Magnesium 05/15/2017 1.8  1.7 - 2.4 mg/dL Final  . Sodium 05/15/2017 137  135 - 145 mmol/L Final  . Potassium 05/15/2017 3.9  3.5 - 5.1 mmol/L Final  . Chloride 05/15/2017 100* 101 - 111 mmol/L Final  .  CO2 05/15/2017 30  22 - 32 mmol/L Final  . Glucose, Bld 05/15/2017 104* 65 - 99 mg/dL Final  . BUN 05/15/2017 22* 6 - 20 mg/dL Final  . Creatinine, Ser 05/15/2017 0.95  0.44 - 1.00 mg/dL Final  . Calcium 05/15/2017 9.8  8.9 - 10.3 mg/dL Final  . GFR calc non Af Amer 05/15/2017 52* >60 mL/min Final  . GFR calc Af Amer 05/15/2017 >60  >60 mL/min Final   Comment: (NOTE) The eGFR has been calculated using the CKD EPI equation. This calculation has not been validated in all clinical situations. eGFR's persistently <60 mL/min signify possible Chronic Kidney Disease.   . Anion gap 05/15/2017 7  5 - 15 Final  . Prothrombin Time 05/16/2017 16.3* 11.4 - 15.2 seconds Final  . INR 05/16/2017 1.33   Final  . Sodium 05/16/2017 138  135 - 145 mmol/L Final  . Potassium 05/16/2017 3.4* 3.5 - 5.1 mmol/L Final  . Chloride 05/16/2017 98* 101 - 111 mmol/L Final  . CO2 05/16/2017 29  22 - 32 mmol/L Final  . Glucose, Bld 05/16/2017 120* 65 - 99 mg/dL Final  . BUN 05/16/2017 20  6 - 20 mg/dL Final  . Creatinine, Ser 05/16/2017 0.94  0.44 - 1.00 mg/dL Final  . Calcium 05/16/2017 9.8  8.9 - 10.3 mg/dL Final  . Total Protein 05/16/2017 5.7* 6.5 - 8.1 g/dL Final  . Albumin 05/16/2017 3.0* 3.5 - 5.0 g/dL Final  . AST 05/16/2017 20  15 - 41 U/L Final  . ALT 05/16/2017 11* 14 - 54 U/L Final  . Alkaline Phosphatase 05/16/2017 62  38 - 126 U/L Final  . Total Bilirubin 05/16/2017 0.8  0.3 - 1.2 mg/dL Final  . GFR calc non Af Amer 05/16/2017 53* >60 mL/min Final  . GFR calc Af Amer 05/16/2017 >60  >60 mL/min Final   Comment: (NOTE) The eGFR has been calculated using the CKD EPI equation. This calculation has not been validated in all clinical situations. eGFR's persistently <60 mL/min signify possible Chronic Kidney Disease.   . Anion gap 05/16/2017 11  5 - 15 Final    Ct  Head Wo Contrast  Result Date: 05/13/2017 CLINICAL DATA:  Minor head trauma while on anti coagulation. EXAM: CT HEAD WITHOUT CONTRAST CT  CERVICAL SPINE WITHOUT CONTRAST TECHNIQUE: Multidetector CT imaging of the head and cervical spine was performed following the standard protocol without intravenous contrast. Multiplanar CT image reconstructions of the cervical spine were also generated. COMPARISON:  Brain MRI 02/06/2014 FINDINGS: CT HEAD FINDINGS Brain: No mass lesion, intraparenchymal hemorrhage or extra-axial collection. No evidence of acute cortical infarct. There is periventricular hypoattenuation compatible with chronic microvascular disease. Partially calcified extra-axial mass at the left middle cranial fossa is unchanged compared to the CT of 03/22/2013. Vascular: No hyperdense vessel or unexpected calcification. Skull: Normal visualized skull base, calvarium and extracranial soft tissues. Sinuses/Orbits: No sinus fluid levels or advanced mucosal thickening. No mastoid effusion. Normal orbits. CT CERVICAL SPINE FINDINGS Alignment: No static subluxation. Facets are aligned. Occipital condyles are normally positioned. Skull base and vertebrae: No acute fracture. Soft tissues and spinal canal: No prevertebral fluid or swelling. No visible canal hematoma. Disc levels: Facet arthrosis is worst at left C4-C5. Upper chest: No pneumothorax, pulmonary nodule or pleural effusion. Other: Normal visualized paraspinal cervical soft tissues. IMPRESSION: 1. Sequelae of chronic ischemic microangiopathy without acute intracranial abnormality. 2. Partially calcified left middle cranial fossa extra-axial mass, possibly meningioma, unchanged compared to 03/22/2013. 3. No acute fracture or static subluxation of the cervical spine. Electronically Signed   By: Ulyses Jarred M.D.   On: 05/13/2017 18:35   Ct Cervical Spine Wo Contrast  Result Date: 05/13/2017 CLINICAL DATA:  Minor head trauma while on anti coagulation. EXAM: CT HEAD WITHOUT CONTRAST CT CERVICAL SPINE WITHOUT CONTRAST TECHNIQUE: Multidetector CT imaging of the head and cervical spine was  performed following the standard protocol without intravenous contrast. Multiplanar CT image reconstructions of the cervical spine were also generated. COMPARISON:  Brain MRI 02/06/2014 FINDINGS: CT HEAD FINDINGS Brain: No mass lesion, intraparenchymal hemorrhage or extra-axial collection. No evidence of acute cortical infarct. There is periventricular hypoattenuation compatible with chronic microvascular disease. Partially calcified extra-axial mass at the left middle cranial fossa is unchanged compared to the CT of 03/22/2013. Vascular: No hyperdense vessel or unexpected calcification. Skull: Normal visualized skull base, calvarium and extracranial soft tissues. Sinuses/Orbits: No sinus fluid levels or advanced mucosal thickening. No mastoid effusion. Normal orbits. CT CERVICAL SPINE FINDINGS Alignment: No static subluxation. Facets are aligned. Occipital condyles are normally positioned. Skull base and vertebrae: No acute fracture. Soft tissues and spinal canal: No prevertebral fluid or swelling. No visible canal hematoma. Disc levels: Facet arthrosis is worst at left C4-C5. Upper chest: No pneumothorax, pulmonary nodule or pleural effusion. Other: Normal visualized paraspinal cervical soft tissues. IMPRESSION: 1. Sequelae of chronic ischemic microangiopathy without acute intracranial abnormality. 2. Partially calcified left middle cranial fossa extra-axial mass, possibly meningioma, unchanged compared to 03/22/2013. 3. No acute fracture or static subluxation of the cervical spine. Electronically Signed   By: Ulyses Jarred M.D.   On: 05/13/2017 18:35   Ct Lumbar Spine Wo Contrast  Result Date: 05/13/2017 CLINICAL DATA:  Back pain EXAM: CT LUMBAR SPINE WITHOUT CONTRAST TECHNIQUE: Multidetector CT imaging of the lumbar spine was performed without intravenous contrast administration. Multiplanar CT image reconstructions were also generated. COMPARISON:  None. FINDINGS: Segmentation: 5 lumbar type vertebrae.  Alignment: Normal. Vertebrae: Compression deformity of L1 with approximately 70% height loss is suspected to be chronic. Superior endplate compression of L2 is also likely chronic. Paraspinal and other soft tissues: There is atherosclerotic calcification of the  non aneurysmal abdominal aorta. Disc levels: There is multilevel facet arthrosis, predominantly mild. The disc spaces are largely preserved. There is no spinal canal stenosis or neural foraminal stenosis. IMPRESSION: 1. Compression deformities at L1 and L2 are favored to be chronic. However, in the absence of prior studies for comparison, they remain strictly age indeterminate. 2. No spinal canal or neural foraminal stenosis. 3. Normal alignment of the lumbar spine. Electronically Signed   By: Ulyses Jarred M.D.   On: 05/13/2017 18:24     Assessment/Plan   ICD-10-CM   1. Hypothyroidism due to acquired atrophy of thyroid E03.4    uncontrolled  2. Chronic atrial fibrillation (HCC) I48.2   3. Chronic systolic CHF (congestive heart failure) (HCC) I50.22   4. Essential hypertension I10   5. Closed compression fracture of L1 lumbar vertebra with routine healing, subsequent encounter S32.010D   6. Closed compression fracture of L2 lumbar vertebra with routine healing, subsequent encounter S32.020D   7. Mixed hyperlipidemia E78.2   8. Age-related osteoporosis with current pathological fracture with routine healing, subsequent encounter M80.00XD     Follow INR - GOAL 2-3 for afib  Cont current meds as ordered  PT/OT as ordered  Fall precautions  F/u with specialists as scheduled  Repeat TSH in 4-6 weeks and adjust thyroid replacement as necessary  GOAL: short term rehab and d/c home when medically appropriate. Communicated with pt and nursing.  Will follow   Jadon Ressler S. Perlie Gold  [facility] [address] [place], [place] [address] [phone] Cell (Monday-Friday 8 AM - 5  PM) [phone] After 5 PM and follow prompts

## 2017-05-26 DIAGNOSIS — Z9181 History of falling: Secondary | ICD-10-CM | POA: Diagnosis not present

## 2017-05-26 DIAGNOSIS — M545 Low back pain: Secondary | ICD-10-CM | POA: Diagnosis not present

## 2017-05-27 ENCOUNTER — Encounter: Payer: Self-pay | Admitting: Adult Health

## 2017-05-27 ENCOUNTER — Non-Acute Institutional Stay (SKILLED_NURSING_FACILITY): Payer: Medicare HMO | Admitting: Adult Health

## 2017-05-27 DIAGNOSIS — S32010D Wedge compression fracture of first lumbar vertebra, subsequent encounter for fracture with routine healing: Secondary | ICD-10-CM | POA: Diagnosis not present

## 2017-05-27 DIAGNOSIS — I5022 Chronic systolic (congestive) heart failure: Secondary | ICD-10-CM | POA: Diagnosis not present

## 2017-05-27 DIAGNOSIS — I482 Chronic atrial fibrillation, unspecified: Secondary | ICD-10-CM

## 2017-05-27 NOTE — Progress Notes (Signed)
Location:   [place] Room Number: 474 Q VZDGL of Service:  SNF (31)   CODE STATUS: Full Code  Allergies  Allergen Reactions  . Foradil [Formoterol] Other (See Comments)    Reaction not recalled (??)    Chief Complaint  Patient presents with  . Discharge Note    Discharging to Home    HPI:  She is being discharged to home with home health for pt/ot/rn. She will not need any dme. She will need to follow up with her medical provider and will need to have her prescriptions written.  She had been hospitalized following a fall at home; with increased back pain and weakness. She was admitted to this facility for short term rehab and is now ready for discharge to home.   Past Medical History:  Diagnosis Date  . Lower extremity edema   . Permanent atrial fibrillation ([place])   . Pulmonary hypertension ([place])     Past Surgical History:  Procedure Laterality Date  . BREAST LUMPECTOMY  8756   left  . CARDIAC CATHETERIZATION  06/13/2010   R & LHC: no CAD  . CATARACT EXTRACTION  2008   bilateral  . [place]   left  . US ECHOCARDIOGRAPHY  02/04/2012   LA mod. dilated,RA mild-mod dilated,mild MR,mod. TR    Social History   Social History  . Marital status: Widowed    Spouse name: N/A  . Number of children: N/A  . Years of education: N/A   Occupational History  . Not on file.   Social History Main Topics  . Smoking status: Never Smoker  . Smokeless tobacco: Never Used  . Alcohol use Yes     Comment: seldom; 03/17/2013-occasionally drinks a glass of wine before bed  . Drug use: No  . Sexual activity: Not on file   Other Topics Concern  . Not on file   Social History Narrative  . No narrative on file   Family History  Problem Relation Age of Onset  . Heart attack Mother   . Heart attack Father   . Diabetes Brother   . Cancer Brother       VITAL SIGNS BP 113/64   Pulse 86   Temp 98.1 F (36.7 C)   Resp 18   Ht 5\' 4"  (1.626 m)   Wt  121 lb (54.9 kg)   SpO2 97%   BMI 20.77 kg/m   Patient's Medications  New Prescriptions   No medications on file  Previous Medications   ACETAMINOPHEN (TYLENOL) 325 MG TABLET    Take 650 mg by mouth 3 (three) times daily.   ATORVASTATIN (LIPITOR) 20 MG TABLET    Take 20 mg by mouth at bedtime.   CALCIUM CARB-CHOLECALCIFEROL (CALCIUM + D3) 600-200 MG-UNIT TABS    Take 1 tablet by mouth 2 (two) times daily.   FUROSEMIDE (LASIX) 40 MG TABLET    Take 40 mg by mouth daily.    METOPROLOL SUCCINATE (TOPROL-XL) 50 MG 24 HR TABLET    Take 50 mg by mouth 2 (two) times daily.   OXYCODONE (OXY IR/ROXICODONE) 5 MG IMMEDIATE RELEASE TABLET    Take 5 mg by mouth every 8 (eight) hours as needed for severe pain. USE ONLY FOR PAIN NOT RELIEVED BY TRAMADOL   PROLIA 60 MG/ML SOLN INJECTION    Inject 60 mg into the skin every 6 (six) months.   SYNTHROID 137 MCG TABLET    Take 137 mcg by mouth at bedtime.  TRAMADOL (ULTRAM) 50 MG TABLET    Give 0.5 tablet by mouth every 8 hours as needed for pain   WARFARIN (COUMADIN) 4 MG TABLET    Take 4 mg by mouth daily.  Modified Medications   No medications on file  Discontinued Medications   OXYCODONE-ACETAMINOPHEN (PERCOCET/ROXICET) 5-325 MG TABLET    Take 1 tablet by mouth every 8 (eight) hours as needed for moderate pain.     SIGNIFICANT DIAGNOSTIC EXAMS  PREVIOUS:   05-13-17: ct of head and cervical spine:  1. Sequelae of chronic ischemic microangiopathy without acute intracranial abnormality. 2. Partially calcified left middle cranial fossa extra-axial mass, possibly meningioma, unchanged compared to 03/22/2013. 3. No acute fracture or static subluxation of the cervical spine.  05-13-17: ct of lumbar spine:  1. Compression deformities at L1 and L2 are favored to be chronic. However, in the absence of prior studies for comparison, they remain strictly age indeterminate. 2. No spinal canal or neural foraminal stenosis. 3. Normal alignment of the lumbar  spine.  NO NEW EXAMS   LABS REVIEWED PREVIOUS:   05-13-17: wbc 5.7; hgb 138; hct 41.9; mcv 88.8; plt 277; glucose 94; bun 19; creat 0.96; k+ 3.7; na++ 137; ca 9.6 INR 9.91 05-14-17: BNP 298.6; tsh 16.315; INR 9.945 05-15-17: glucose 104; bun 22; creat 0.95; k+ 3.9; na++ 137; ca 9.8; INR 2.00 05-16-17: glucose 120; bun 20; creat 0.94; k+ 3.4; na++ 138; ca 9.8   NO NEW LABS  Review of Systems  Constitutional: Negative for malaise/fatigue.  Respiratory: Negative for cough and shortness of breath.   Cardiovascular: Negative for chest pain, palpitations and leg swelling.  Gastrointestinal: Negative for abdominal pain, constipation and heartburn.  Musculoskeletal: Positive for back pain. Negative for joint pain and myalgias.       Has chronic back pain is chronic and is being managed   Skin: Negative.   Neurological: Negative for dizziness.  Psychiatric/Behavioral: The patient is not nervous/anxious.    Physical Exam  Constitutional: No distress.  Thin   Neck: Neck supple. No thyromegaly present.  Cardiovascular: Normal rate, normal heart sounds and intact distal pulses.  Heart rate irregular   Pulmonary/Chest: Effort normal and breath sounds normal. No respiratory distress.  Abdominal: Soft. Bowel sounds are normal. She exhibits no distension. There is no tenderness.  Musculoskeletal: She exhibits no edema.  Has kyphosis   Lymphadenopathy:    She has no cervical adenopathy.  Neurological: She is alert.  Skin: Skin is warm and dry. She is not diaphoretic.  Psychiatric: She has a normal mood and affect.    ASSESSMENT/ PLAN:  She is being discharged to home with home health for pt/ot/rn to evaluate and treat as indicated for gait balance strength adl training and medication management. Will not need DME.  The facility is aware to setup a follows up appointment with her medical provider in 1-2 weeks discharge.  Her 30 day prescriptions were written per her medication list as  above The narcotic prescriptions written as follows #10 Oxycodone 5 mg tabs #10 ultram 50 mg tabs  Time spent with patient: 40 minutes: discussing with the patient home health needs; her medications; and what to expect at upon discharge. Patient verbalized understanding.   MD is aware of resident's narcotic use and is in agreement with current plan of care. We will attempt to wean resident as apropriate     Ok Edwards NP [facility]  Contact [phone] Monday through Friday 8am- 5pm  After hours call  [phone]    

## 2017-05-29 DIAGNOSIS — Z9181 History of falling: Secondary | ICD-10-CM | POA: Diagnosis not present

## 2017-05-29 DIAGNOSIS — I10 Essential (primary) hypertension: Secondary | ICD-10-CM | POA: Diagnosis not present

## 2017-05-29 DIAGNOSIS — Z09 Encounter for follow-up examination after completed treatment for conditions other than malignant neoplasm: Secondary | ICD-10-CM | POA: Diagnosis not present

## 2017-05-29 DIAGNOSIS — I4891 Unspecified atrial fibrillation: Secondary | ICD-10-CM | POA: Diagnosis not present

## 2017-05-29 DIAGNOSIS — M545 Low back pain: Secondary | ICD-10-CM | POA: Diagnosis not present

## 2017-05-29 DIAGNOSIS — Z7901 Long term (current) use of anticoagulants: Secondary | ICD-10-CM | POA: Diagnosis not present

## 2017-05-29 LAB — PROTIME-INR: PROTIME: 44 — AB (ref 10.0–13.8)

## 2017-05-29 LAB — POCT INR: INR: 4.4 — AB (ref 0.9–1.1)

## 2017-06-01 DIAGNOSIS — M8000XA Age-related osteoporosis with current pathological fracture, unspecified site, initial encounter for fracture: Secondary | ICD-10-CM | POA: Insufficient documentation

## 2017-06-02 DIAGNOSIS — R413 Other amnesia: Secondary | ICD-10-CM | POA: Diagnosis not present

## 2017-06-02 DIAGNOSIS — I482 Chronic atrial fibrillation: Secondary | ICD-10-CM | POA: Diagnosis not present

## 2017-06-02 DIAGNOSIS — Z7901 Long term (current) use of anticoagulants: Secondary | ICD-10-CM | POA: Diagnosis not present

## 2017-06-02 DIAGNOSIS — Z1212 Encounter for screening for malignant neoplasm of rectum: Secondary | ICD-10-CM | POA: Diagnosis not present

## 2017-06-02 DIAGNOSIS — E789 Disorder of lipoprotein metabolism, unspecified: Secondary | ICD-10-CM | POA: Diagnosis not present

## 2017-06-02 DIAGNOSIS — Z0001 Encounter for general adult medical examination with abnormal findings: Secondary | ICD-10-CM | POA: Diagnosis not present

## 2017-06-02 DIAGNOSIS — M810 Age-related osteoporosis without current pathological fracture: Secondary | ICD-10-CM | POA: Diagnosis not present

## 2017-06-02 DIAGNOSIS — D329 Benign neoplasm of meninges, unspecified: Secondary | ICD-10-CM | POA: Diagnosis not present

## 2017-06-02 DIAGNOSIS — E039 Hypothyroidism, unspecified: Secondary | ICD-10-CM | POA: Diagnosis not present

## 2017-06-02 DIAGNOSIS — E875 Hyperkalemia: Secondary | ICD-10-CM | POA: Diagnosis not present

## 2017-06-02 DIAGNOSIS — I5022 Chronic systolic (congestive) heart failure: Secondary | ICD-10-CM | POA: Diagnosis not present

## 2017-06-02 DIAGNOSIS — I4891 Unspecified atrial fibrillation: Secondary | ICD-10-CM | POA: Diagnosis not present

## 2017-06-03 DIAGNOSIS — M8008XD Age-related osteoporosis with current pathological fracture, vertebra(e), subsequent encounter for fracture with routine healing: Secondary | ICD-10-CM | POA: Diagnosis not present

## 2017-06-03 DIAGNOSIS — I482 Chronic atrial fibrillation: Secondary | ICD-10-CM | POA: Diagnosis not present

## 2017-06-03 DIAGNOSIS — I272 Pulmonary hypertension, unspecified: Secondary | ICD-10-CM | POA: Diagnosis not present

## 2017-06-03 DIAGNOSIS — J984 Other disorders of lung: Secondary | ICD-10-CM | POA: Diagnosis not present

## 2017-06-03 DIAGNOSIS — I110 Hypertensive heart disease with heart failure: Secondary | ICD-10-CM | POA: Diagnosis not present

## 2017-06-03 DIAGNOSIS — I5022 Chronic systolic (congestive) heart failure: Secondary | ICD-10-CM | POA: Diagnosis not present

## 2017-06-05 DIAGNOSIS — J984 Other disorders of lung: Secondary | ICD-10-CM | POA: Diagnosis not present

## 2017-06-05 DIAGNOSIS — I272 Pulmonary hypertension, unspecified: Secondary | ICD-10-CM | POA: Diagnosis not present

## 2017-06-05 DIAGNOSIS — M8008XD Age-related osteoporosis with current pathological fracture, vertebra(e), subsequent encounter for fracture with routine healing: Secondary | ICD-10-CM | POA: Diagnosis not present

## 2017-06-05 DIAGNOSIS — I110 Hypertensive heart disease with heart failure: Secondary | ICD-10-CM | POA: Diagnosis not present

## 2017-06-05 DIAGNOSIS — I482 Chronic atrial fibrillation: Secondary | ICD-10-CM | POA: Diagnosis not present

## 2017-06-05 DIAGNOSIS — I5022 Chronic systolic (congestive) heart failure: Secondary | ICD-10-CM | POA: Diagnosis not present

## 2017-06-06 DIAGNOSIS — J984 Other disorders of lung: Secondary | ICD-10-CM | POA: Diagnosis not present

## 2017-06-06 DIAGNOSIS — I482 Chronic atrial fibrillation: Secondary | ICD-10-CM | POA: Diagnosis not present

## 2017-06-06 DIAGNOSIS — M8008XD Age-related osteoporosis with current pathological fracture, vertebra(e), subsequent encounter for fracture with routine healing: Secondary | ICD-10-CM | POA: Diagnosis not present

## 2017-06-06 DIAGNOSIS — I272 Pulmonary hypertension, unspecified: Secondary | ICD-10-CM | POA: Diagnosis not present

## 2017-06-06 DIAGNOSIS — I110 Hypertensive heart disease with heart failure: Secondary | ICD-10-CM | POA: Diagnosis not present

## 2017-06-06 DIAGNOSIS — I5022 Chronic systolic (congestive) heart failure: Secondary | ICD-10-CM | POA: Diagnosis not present

## 2017-06-09 ENCOUNTER — Ambulatory Visit: Payer: Medicare HMO | Admitting: Physician Assistant

## 2017-06-10 ENCOUNTER — Encounter: Payer: Self-pay | Admitting: *Deleted

## 2017-06-10 DIAGNOSIS — I5022 Chronic systolic (congestive) heart failure: Secondary | ICD-10-CM | POA: Diagnosis not present

## 2017-06-10 DIAGNOSIS — J984 Other disorders of lung: Secondary | ICD-10-CM | POA: Diagnosis not present

## 2017-06-10 DIAGNOSIS — M8008XD Age-related osteoporosis with current pathological fracture, vertebra(e), subsequent encounter for fracture with routine healing: Secondary | ICD-10-CM | POA: Diagnosis not present

## 2017-06-10 DIAGNOSIS — I110 Hypertensive heart disease with heart failure: Secondary | ICD-10-CM | POA: Diagnosis not present

## 2017-06-10 DIAGNOSIS — I272 Pulmonary hypertension, unspecified: Secondary | ICD-10-CM | POA: Diagnosis not present

## 2017-06-10 DIAGNOSIS — I482 Chronic atrial fibrillation: Secondary | ICD-10-CM | POA: Diagnosis not present

## 2017-06-11 ENCOUNTER — Telehealth: Payer: Self-pay

## 2017-06-11 DIAGNOSIS — S32000A Wedge compression fracture of unspecified lumbar vertebra, initial encounter for closed fracture: Secondary | ICD-10-CM | POA: Diagnosis not present

## 2017-06-11 DIAGNOSIS — I5022 Chronic systolic (congestive) heart failure: Secondary | ICD-10-CM | POA: Diagnosis not present

## 2017-06-11 NOTE — Telephone Encounter (Signed)
Per staff message Barrett, Wendy Croon, PA-C  Waylan Rocher, LPN [name] was are 9:24 PM patient that was a no show.  She had been in the hospital and was discharged to a short-term SNF for rehabilitation. She is now out of that facility. As we had not seen her in 2 years, we should call and offer her a follow-up appointment. If she wishes just to follow-up with her PCP and see [place] as needed, that is okay.  Thanks  Spoke with [name] [facility]) he states that he did not know about appt and pt has memory problems. He states that she went to see her PCP and he says pt is doing fine she has another f/u scheduled to see him. Informed Mr Kalman Shan that pt has not been seen for 2 years he states that he would like a f/u scheduled appt in November with APP for f/u, they will arrive early for appt

## 2017-06-12 DIAGNOSIS — I110 Hypertensive heart disease with heart failure: Secondary | ICD-10-CM | POA: Diagnosis not present

## 2017-06-12 DIAGNOSIS — J984 Other disorders of lung: Secondary | ICD-10-CM | POA: Diagnosis not present

## 2017-06-12 DIAGNOSIS — I5022 Chronic systolic (congestive) heart failure: Secondary | ICD-10-CM | POA: Diagnosis not present

## 2017-06-12 DIAGNOSIS — M8008XD Age-related osteoporosis with current pathological fracture, vertebra(e), subsequent encounter for fracture with routine healing: Secondary | ICD-10-CM | POA: Diagnosis not present

## 2017-06-12 DIAGNOSIS — I482 Chronic atrial fibrillation: Secondary | ICD-10-CM | POA: Diagnosis not present

## 2017-06-12 DIAGNOSIS — I272 Pulmonary hypertension, unspecified: Secondary | ICD-10-CM | POA: Diagnosis not present

## 2017-06-13 DIAGNOSIS — I5022 Chronic systolic (congestive) heart failure: Secondary | ICD-10-CM | POA: Diagnosis not present

## 2017-06-13 DIAGNOSIS — M8008XD Age-related osteoporosis with current pathological fracture, vertebra(e), subsequent encounter for fracture with routine healing: Secondary | ICD-10-CM | POA: Diagnosis not present

## 2017-06-13 DIAGNOSIS — I482 Chronic atrial fibrillation: Secondary | ICD-10-CM | POA: Diagnosis not present

## 2017-06-13 DIAGNOSIS — J984 Other disorders of lung: Secondary | ICD-10-CM | POA: Diagnosis not present

## 2017-06-13 DIAGNOSIS — I272 Pulmonary hypertension, unspecified: Secondary | ICD-10-CM | POA: Diagnosis not present

## 2017-06-13 DIAGNOSIS — I110 Hypertensive heart disease with heart failure: Secondary | ICD-10-CM | POA: Diagnosis not present

## 2017-06-14 DIAGNOSIS — I5022 Chronic systolic (congestive) heart failure: Secondary | ICD-10-CM | POA: Diagnosis not present

## 2017-06-14 DIAGNOSIS — I110 Hypertensive heart disease with heart failure: Secondary | ICD-10-CM | POA: Diagnosis not present

## 2017-06-14 DIAGNOSIS — M8008XD Age-related osteoporosis with current pathological fracture, vertebra(e), subsequent encounter for fracture with routine healing: Secondary | ICD-10-CM | POA: Diagnosis not present

## 2017-06-14 DIAGNOSIS — J984 Other disorders of lung: Secondary | ICD-10-CM | POA: Diagnosis not present

## 2017-06-14 DIAGNOSIS — I482 Chronic atrial fibrillation: Secondary | ICD-10-CM | POA: Diagnosis not present

## 2017-06-14 DIAGNOSIS — I272 Pulmonary hypertension, unspecified: Secondary | ICD-10-CM | POA: Diagnosis not present

## 2017-06-16 DIAGNOSIS — J984 Other disorders of lung: Secondary | ICD-10-CM | POA: Diagnosis not present

## 2017-06-16 DIAGNOSIS — I5022 Chronic systolic (congestive) heart failure: Secondary | ICD-10-CM | POA: Diagnosis not present

## 2017-06-16 DIAGNOSIS — M8008XD Age-related osteoporosis with current pathological fracture, vertebra(e), subsequent encounter for fracture with routine healing: Secondary | ICD-10-CM | POA: Diagnosis not present

## 2017-06-16 DIAGNOSIS — I272 Pulmonary hypertension, unspecified: Secondary | ICD-10-CM | POA: Diagnosis not present

## 2017-06-16 DIAGNOSIS — I482 Chronic atrial fibrillation: Secondary | ICD-10-CM | POA: Diagnosis not present

## 2017-06-16 DIAGNOSIS — I110 Hypertensive heart disease with heart failure: Secondary | ICD-10-CM | POA: Diagnosis not present

## 2017-06-17 DIAGNOSIS — I272 Pulmonary hypertension, unspecified: Secondary | ICD-10-CM | POA: Diagnosis not present

## 2017-06-17 DIAGNOSIS — I482 Chronic atrial fibrillation: Secondary | ICD-10-CM | POA: Diagnosis not present

## 2017-06-17 DIAGNOSIS — I5022 Chronic systolic (congestive) heart failure: Secondary | ICD-10-CM | POA: Diagnosis not present

## 2017-06-17 DIAGNOSIS — J984 Other disorders of lung: Secondary | ICD-10-CM | POA: Diagnosis not present

## 2017-06-17 DIAGNOSIS — M8008XD Age-related osteoporosis with current pathological fracture, vertebra(e), subsequent encounter for fracture with routine healing: Secondary | ICD-10-CM | POA: Diagnosis not present

## 2017-06-17 DIAGNOSIS — I110 Hypertensive heart disease with heart failure: Secondary | ICD-10-CM | POA: Diagnosis not present

## 2017-06-18 DIAGNOSIS — I110 Hypertensive heart disease with heart failure: Secondary | ICD-10-CM | POA: Diagnosis not present

## 2017-06-18 DIAGNOSIS — I482 Chronic atrial fibrillation: Secondary | ICD-10-CM | POA: Diagnosis not present

## 2017-06-18 DIAGNOSIS — I272 Pulmonary hypertension, unspecified: Secondary | ICD-10-CM | POA: Diagnosis not present

## 2017-06-18 DIAGNOSIS — J984 Other disorders of lung: Secondary | ICD-10-CM | POA: Diagnosis not present

## 2017-06-18 DIAGNOSIS — I5022 Chronic systolic (congestive) heart failure: Secondary | ICD-10-CM | POA: Diagnosis not present

## 2017-06-18 DIAGNOSIS — M8008XD Age-related osteoporosis with current pathological fracture, vertebra(e), subsequent encounter for fracture with routine healing: Secondary | ICD-10-CM | POA: Diagnosis not present

## 2017-06-19 DIAGNOSIS — I482 Chronic atrial fibrillation: Secondary | ICD-10-CM | POA: Diagnosis not present

## 2017-06-19 DIAGNOSIS — I5022 Chronic systolic (congestive) heart failure: Secondary | ICD-10-CM | POA: Diagnosis not present

## 2017-06-19 DIAGNOSIS — J984 Other disorders of lung: Secondary | ICD-10-CM | POA: Diagnosis not present

## 2017-06-19 DIAGNOSIS — I272 Pulmonary hypertension, unspecified: Secondary | ICD-10-CM | POA: Diagnosis not present

## 2017-06-19 DIAGNOSIS — I110 Hypertensive heart disease with heart failure: Secondary | ICD-10-CM | POA: Diagnosis not present

## 2017-06-19 DIAGNOSIS — M8008XD Age-related osteoporosis with current pathological fracture, vertebra(e), subsequent encounter for fracture with routine healing: Secondary | ICD-10-CM | POA: Diagnosis not present

## 2017-06-23 DIAGNOSIS — M8008XD Age-related osteoporosis with current pathological fracture, vertebra(e), subsequent encounter for fracture with routine healing: Secondary | ICD-10-CM | POA: Diagnosis not present

## 2017-06-23 DIAGNOSIS — I5022 Chronic systolic (congestive) heart failure: Secondary | ICD-10-CM | POA: Diagnosis not present

## 2017-06-23 DIAGNOSIS — J984 Other disorders of lung: Secondary | ICD-10-CM | POA: Diagnosis not present

## 2017-06-23 DIAGNOSIS — I482 Chronic atrial fibrillation: Secondary | ICD-10-CM | POA: Diagnosis not present

## 2017-06-23 DIAGNOSIS — I110 Hypertensive heart disease with heart failure: Secondary | ICD-10-CM | POA: Diagnosis not present

## 2017-06-23 DIAGNOSIS — I272 Pulmonary hypertension, unspecified: Secondary | ICD-10-CM | POA: Diagnosis not present

## 2017-06-24 DIAGNOSIS — M8008XD Age-related osteoporosis with current pathological fracture, vertebra(e), subsequent encounter for fracture with routine healing: Secondary | ICD-10-CM | POA: Diagnosis not present

## 2017-06-24 DIAGNOSIS — I5022 Chronic systolic (congestive) heart failure: Secondary | ICD-10-CM | POA: Diagnosis not present

## 2017-06-24 DIAGNOSIS — J984 Other disorders of lung: Secondary | ICD-10-CM | POA: Diagnosis not present

## 2017-06-24 DIAGNOSIS — I110 Hypertensive heart disease with heart failure: Secondary | ICD-10-CM | POA: Diagnosis not present

## 2017-06-24 DIAGNOSIS — I272 Pulmonary hypertension, unspecified: Secondary | ICD-10-CM | POA: Diagnosis not present

## 2017-06-24 DIAGNOSIS — I482 Chronic atrial fibrillation: Secondary | ICD-10-CM | POA: Diagnosis not present

## 2017-06-25 DIAGNOSIS — I5022 Chronic systolic (congestive) heart failure: Secondary | ICD-10-CM | POA: Diagnosis not present

## 2017-06-25 DIAGNOSIS — I110 Hypertensive heart disease with heart failure: Secondary | ICD-10-CM | POA: Diagnosis not present

## 2017-06-25 DIAGNOSIS — M8008XD Age-related osteoporosis with current pathological fracture, vertebra(e), subsequent encounter for fracture with routine healing: Secondary | ICD-10-CM | POA: Diagnosis not present

## 2017-06-25 DIAGNOSIS — J984 Other disorders of lung: Secondary | ICD-10-CM | POA: Diagnosis not present

## 2017-06-25 DIAGNOSIS — I272 Pulmonary hypertension, unspecified: Secondary | ICD-10-CM | POA: Diagnosis not present

## 2017-06-25 DIAGNOSIS — I482 Chronic atrial fibrillation: Secondary | ICD-10-CM | POA: Diagnosis not present

## 2017-06-26 DIAGNOSIS — I482 Chronic atrial fibrillation: Secondary | ICD-10-CM | POA: Diagnosis not present

## 2017-06-26 DIAGNOSIS — I5022 Chronic systolic (congestive) heart failure: Secondary | ICD-10-CM | POA: Diagnosis not present

## 2017-06-26 DIAGNOSIS — I272 Pulmonary hypertension, unspecified: Secondary | ICD-10-CM | POA: Diagnosis not present

## 2017-06-26 DIAGNOSIS — J984 Other disorders of lung: Secondary | ICD-10-CM | POA: Diagnosis not present

## 2017-06-26 DIAGNOSIS — I110 Hypertensive heart disease with heart failure: Secondary | ICD-10-CM | POA: Diagnosis not present

## 2017-06-26 DIAGNOSIS — M8008XD Age-related osteoporosis with current pathological fracture, vertebra(e), subsequent encounter for fracture with routine healing: Secondary | ICD-10-CM | POA: Diagnosis not present

## 2017-06-27 ENCOUNTER — Ambulatory Visit: Payer: Medicare HMO | Admitting: Cardiology

## 2017-06-27 ENCOUNTER — Encounter: Payer: Self-pay | Admitting: *Deleted

## 2017-06-27 DIAGNOSIS — I428 Other cardiomyopathies: Secondary | ICD-10-CM | POA: Insufficient documentation

## 2017-06-30 DIAGNOSIS — M8008XD Age-related osteoporosis with current pathological fracture, vertebra(e), subsequent encounter for fracture with routine healing: Secondary | ICD-10-CM | POA: Diagnosis not present

## 2017-06-30 DIAGNOSIS — I5022 Chronic systolic (congestive) heart failure: Secondary | ICD-10-CM | POA: Diagnosis not present

## 2017-06-30 DIAGNOSIS — I272 Pulmonary hypertension, unspecified: Secondary | ICD-10-CM | POA: Diagnosis not present

## 2017-06-30 DIAGNOSIS — I110 Hypertensive heart disease with heart failure: Secondary | ICD-10-CM | POA: Diagnosis not present

## 2017-06-30 DIAGNOSIS — I482 Chronic atrial fibrillation: Secondary | ICD-10-CM | POA: Diagnosis not present

## 2017-06-30 DIAGNOSIS — J984 Other disorders of lung: Secondary | ICD-10-CM | POA: Diagnosis not present

## 2017-07-02 DIAGNOSIS — M8008XD Age-related osteoporosis with current pathological fracture, vertebra(e), subsequent encounter for fracture with routine healing: Secondary | ICD-10-CM | POA: Diagnosis not present

## 2017-07-02 DIAGNOSIS — I5022 Chronic systolic (congestive) heart failure: Secondary | ICD-10-CM | POA: Diagnosis not present

## 2017-07-02 DIAGNOSIS — I482 Chronic atrial fibrillation: Secondary | ICD-10-CM | POA: Diagnosis not present

## 2017-07-02 DIAGNOSIS — J984 Other disorders of lung: Secondary | ICD-10-CM | POA: Diagnosis not present

## 2017-07-02 DIAGNOSIS — I110 Hypertensive heart disease with heart failure: Secondary | ICD-10-CM | POA: Diagnosis not present

## 2017-07-02 DIAGNOSIS — I272 Pulmonary hypertension, unspecified: Secondary | ICD-10-CM | POA: Diagnosis not present

## 2017-07-07 DIAGNOSIS — I110 Hypertensive heart disease with heart failure: Secondary | ICD-10-CM | POA: Diagnosis not present

## 2017-07-07 DIAGNOSIS — I5022 Chronic systolic (congestive) heart failure: Secondary | ICD-10-CM | POA: Diagnosis not present

## 2017-07-07 DIAGNOSIS — I272 Pulmonary hypertension, unspecified: Secondary | ICD-10-CM | POA: Diagnosis not present

## 2017-07-07 DIAGNOSIS — I482 Chronic atrial fibrillation: Secondary | ICD-10-CM | POA: Diagnosis not present

## 2017-07-07 DIAGNOSIS — M8008XD Age-related osteoporosis with current pathological fracture, vertebra(e), subsequent encounter for fracture with routine healing: Secondary | ICD-10-CM | POA: Diagnosis not present

## 2017-07-07 DIAGNOSIS — J984 Other disorders of lung: Secondary | ICD-10-CM | POA: Diagnosis not present

## 2017-07-08 DIAGNOSIS — I272 Pulmonary hypertension, unspecified: Secondary | ICD-10-CM | POA: Diagnosis not present

## 2017-07-08 DIAGNOSIS — I110 Hypertensive heart disease with heart failure: Secondary | ICD-10-CM | POA: Diagnosis not present

## 2017-07-08 DIAGNOSIS — I482 Chronic atrial fibrillation: Secondary | ICD-10-CM | POA: Diagnosis not present

## 2017-07-08 DIAGNOSIS — M8008XD Age-related osteoporosis with current pathological fracture, vertebra(e), subsequent encounter for fracture with routine healing: Secondary | ICD-10-CM | POA: Diagnosis not present

## 2017-07-08 DIAGNOSIS — J984 Other disorders of lung: Secondary | ICD-10-CM | POA: Diagnosis not present

## 2017-07-08 DIAGNOSIS — I5022 Chronic systolic (congestive) heart failure: Secondary | ICD-10-CM | POA: Diagnosis not present

## 2017-07-09 DIAGNOSIS — I272 Pulmonary hypertension, unspecified: Secondary | ICD-10-CM | POA: Diagnosis not present

## 2017-07-09 DIAGNOSIS — I110 Hypertensive heart disease with heart failure: Secondary | ICD-10-CM | POA: Diagnosis not present

## 2017-07-09 DIAGNOSIS — I5022 Chronic systolic (congestive) heart failure: Secondary | ICD-10-CM | POA: Diagnosis not present

## 2017-07-09 DIAGNOSIS — J984 Other disorders of lung: Secondary | ICD-10-CM | POA: Diagnosis not present

## 2017-07-09 DIAGNOSIS — I482 Chronic atrial fibrillation: Secondary | ICD-10-CM | POA: Diagnosis not present

## 2017-07-09 DIAGNOSIS — M8008XD Age-related osteoporosis with current pathological fracture, vertebra(e), subsequent encounter for fracture with routine healing: Secondary | ICD-10-CM | POA: Diagnosis not present

## 2017-07-10 DIAGNOSIS — M8008XD Age-related osteoporosis with current pathological fracture, vertebra(e), subsequent encounter for fracture with routine healing: Secondary | ICD-10-CM | POA: Diagnosis not present

## 2017-07-10 DIAGNOSIS — J984 Other disorders of lung: Secondary | ICD-10-CM | POA: Diagnosis not present

## 2017-07-10 DIAGNOSIS — I482 Chronic atrial fibrillation: Secondary | ICD-10-CM | POA: Diagnosis not present

## 2017-07-10 DIAGNOSIS — I5022 Chronic systolic (congestive) heart failure: Secondary | ICD-10-CM | POA: Diagnosis not present

## 2017-07-10 DIAGNOSIS — I110 Hypertensive heart disease with heart failure: Secondary | ICD-10-CM | POA: Diagnosis not present

## 2017-07-10 DIAGNOSIS — I272 Pulmonary hypertension, unspecified: Secondary | ICD-10-CM | POA: Diagnosis not present

## 2017-07-15 DIAGNOSIS — I5022 Chronic systolic (congestive) heart failure: Secondary | ICD-10-CM | POA: Diagnosis not present

## 2017-07-15 DIAGNOSIS — I110 Hypertensive heart disease with heart failure: Secondary | ICD-10-CM | POA: Diagnosis not present

## 2017-07-15 DIAGNOSIS — I482 Chronic atrial fibrillation: Secondary | ICD-10-CM | POA: Diagnosis not present

## 2017-07-15 DIAGNOSIS — R413 Other amnesia: Secondary | ICD-10-CM | POA: Diagnosis not present

## 2017-07-15 DIAGNOSIS — J984 Other disorders of lung: Secondary | ICD-10-CM | POA: Diagnosis not present

## 2017-07-15 DIAGNOSIS — M8008XD Age-related osteoporosis with current pathological fracture, vertebra(e), subsequent encounter for fracture with routine healing: Secondary | ICD-10-CM | POA: Diagnosis not present

## 2017-07-15 DIAGNOSIS — I272 Pulmonary hypertension, unspecified: Secondary | ICD-10-CM | POA: Diagnosis not present

## 2017-07-15 DIAGNOSIS — M810 Age-related osteoporosis without current pathological fracture: Secondary | ICD-10-CM | POA: Diagnosis not present

## 2017-07-17 DIAGNOSIS — M8008XD Age-related osteoporosis with current pathological fracture, vertebra(e), subsequent encounter for fracture with routine healing: Secondary | ICD-10-CM | POA: Diagnosis not present

## 2017-07-17 DIAGNOSIS — J984 Other disorders of lung: Secondary | ICD-10-CM | POA: Diagnosis not present

## 2017-07-17 DIAGNOSIS — I5022 Chronic systolic (congestive) heart failure: Secondary | ICD-10-CM | POA: Diagnosis not present

## 2017-07-17 DIAGNOSIS — I110 Hypertensive heart disease with heart failure: Secondary | ICD-10-CM | POA: Diagnosis not present

## 2017-07-17 DIAGNOSIS — I482 Chronic atrial fibrillation: Secondary | ICD-10-CM | POA: Diagnosis not present

## 2017-07-17 DIAGNOSIS — I272 Pulmonary hypertension, unspecified: Secondary | ICD-10-CM | POA: Diagnosis not present

## 2017-07-18 DIAGNOSIS — I272 Pulmonary hypertension, unspecified: Secondary | ICD-10-CM | POA: Diagnosis not present

## 2017-07-18 DIAGNOSIS — I482 Chronic atrial fibrillation: Secondary | ICD-10-CM | POA: Diagnosis not present

## 2017-07-18 DIAGNOSIS — I5022 Chronic systolic (congestive) heart failure: Secondary | ICD-10-CM | POA: Diagnosis not present

## 2017-07-18 DIAGNOSIS — I110 Hypertensive heart disease with heart failure: Secondary | ICD-10-CM | POA: Diagnosis not present

## 2017-07-18 DIAGNOSIS — M8008XD Age-related osteoporosis with current pathological fracture, vertebra(e), subsequent encounter for fracture with routine healing: Secondary | ICD-10-CM | POA: Diagnosis not present

## 2017-07-18 DIAGNOSIS — J984 Other disorders of lung: Secondary | ICD-10-CM | POA: Diagnosis not present

## 2017-07-25 DIAGNOSIS — I482 Chronic atrial fibrillation: Secondary | ICD-10-CM | POA: Diagnosis not present

## 2017-07-25 DIAGNOSIS — J984 Other disorders of lung: Secondary | ICD-10-CM | POA: Diagnosis not present

## 2017-07-25 DIAGNOSIS — M8008XD Age-related osteoporosis with current pathological fracture, vertebra(e), subsequent encounter for fracture with routine healing: Secondary | ICD-10-CM | POA: Diagnosis not present

## 2017-07-25 DIAGNOSIS — I5022 Chronic systolic (congestive) heart failure: Secondary | ICD-10-CM | POA: Diagnosis not present

## 2017-07-25 DIAGNOSIS — I110 Hypertensive heart disease with heart failure: Secondary | ICD-10-CM | POA: Diagnosis not present

## 2017-07-25 DIAGNOSIS — I272 Pulmonary hypertension, unspecified: Secondary | ICD-10-CM | POA: Diagnosis not present

## 2017-07-26 DIAGNOSIS — M8008XD Age-related osteoporosis with current pathological fracture, vertebra(e), subsequent encounter for fracture with routine healing: Secondary | ICD-10-CM | POA: Diagnosis not present

## 2017-07-26 DIAGNOSIS — I110 Hypertensive heart disease with heart failure: Secondary | ICD-10-CM | POA: Diagnosis not present

## 2017-07-26 DIAGNOSIS — I5022 Chronic systolic (congestive) heart failure: Secondary | ICD-10-CM | POA: Diagnosis not present

## 2017-07-26 DIAGNOSIS — I272 Pulmonary hypertension, unspecified: Secondary | ICD-10-CM | POA: Diagnosis not present

## 2017-07-26 DIAGNOSIS — J984 Other disorders of lung: Secondary | ICD-10-CM | POA: Diagnosis not present

## 2017-07-26 DIAGNOSIS — I482 Chronic atrial fibrillation: Secondary | ICD-10-CM | POA: Diagnosis not present

## 2017-07-29 DIAGNOSIS — M810 Age-related osteoporosis without current pathological fracture: Secondary | ICD-10-CM | POA: Diagnosis not present

## 2017-07-30 DIAGNOSIS — I110 Hypertensive heart disease with heart failure: Secondary | ICD-10-CM | POA: Diagnosis not present

## 2017-07-30 DIAGNOSIS — J984 Other disorders of lung: Secondary | ICD-10-CM | POA: Diagnosis not present

## 2017-07-30 DIAGNOSIS — I5022 Chronic systolic (congestive) heart failure: Secondary | ICD-10-CM | POA: Diagnosis not present

## 2017-07-30 DIAGNOSIS — I272 Pulmonary hypertension, unspecified: Secondary | ICD-10-CM | POA: Diagnosis not present

## 2017-07-30 DIAGNOSIS — I482 Chronic atrial fibrillation: Secondary | ICD-10-CM | POA: Diagnosis not present

## 2017-07-30 DIAGNOSIS — M8008XD Age-related osteoporosis with current pathological fracture, vertebra(e), subsequent encounter for fracture with routine healing: Secondary | ICD-10-CM | POA: Diagnosis not present

## 2017-08-11 ENCOUNTER — Emergency Department (HOSPITAL_COMMUNITY): Payer: Medicare HMO

## 2017-08-11 ENCOUNTER — Emergency Department (HOSPITAL_COMMUNITY)
Admission: EM | Admit: 2017-08-11 | Discharge: 2017-08-12 | Disposition: A | Discharge status: Home / Self Care | Payer: Medicare HMO | Attending: Emergency Medicine | Admitting: Emergency Medicine

## 2017-08-11 DIAGNOSIS — Z7901 Long term (current) use of anticoagulants: Secondary | ICD-10-CM | POA: Insufficient documentation

## 2017-08-11 DIAGNOSIS — E039 Hypothyroidism, unspecified: Secondary | ICD-10-CM | POA: Diagnosis not present

## 2017-08-11 DIAGNOSIS — M542 Cervicalgia: Secondary | ICD-10-CM | POA: Insufficient documentation

## 2017-08-11 DIAGNOSIS — W19XXXA Unspecified fall, initial encounter: Secondary | ICD-10-CM

## 2017-08-11 DIAGNOSIS — M546 Pain in thoracic spine: Secondary | ICD-10-CM | POA: Diagnosis not present

## 2017-08-11 DIAGNOSIS — Y939 Activity, unspecified: Secondary | ICD-10-CM | POA: Diagnosis not present

## 2017-08-11 DIAGNOSIS — M4850XA Collapsed vertebra, not elsewhere classified, site unspecified, initial encounter for fracture: Secondary | ICD-10-CM

## 2017-08-11 DIAGNOSIS — Y999 Unspecified external cause status: Secondary | ICD-10-CM | POA: Diagnosis not present

## 2017-08-11 DIAGNOSIS — I110 Hypertensive heart disease with heart failure: Secondary | ICD-10-CM | POA: Insufficient documentation

## 2017-08-11 DIAGNOSIS — W0110XA Fall on same level from slipping, tripping and stumbling with subsequent striking against unspecified object, initial encounter: Secondary | ICD-10-CM | POA: Insufficient documentation

## 2017-08-11 DIAGNOSIS — S299XXA Unspecified injury of thorax, initial encounter: Secondary | ICD-10-CM | POA: Diagnosis not present

## 2017-08-11 DIAGNOSIS — S22030A Wedge compression fracture of third thoracic vertebra, initial encounter for closed fracture: Secondary | ICD-10-CM | POA: Insufficient documentation

## 2017-08-11 DIAGNOSIS — Z79899 Other long term (current) drug therapy: Secondary | ICD-10-CM | POA: Insufficient documentation

## 2017-08-11 DIAGNOSIS — S0990XA Unspecified injury of head, initial encounter: Secondary | ICD-10-CM | POA: Diagnosis present

## 2017-08-11 DIAGNOSIS — Y92009 Unspecified place in unspecified non-institutional (private) residence as the place of occurrence of the external cause: Secondary | ICD-10-CM | POA: Diagnosis not present

## 2017-08-11 DIAGNOSIS — S199XXA Unspecified injury of neck, initial encounter: Secondary | ICD-10-CM | POA: Diagnosis not present

## 2017-08-11 DIAGNOSIS — S32020A Wedge compression fracture of second lumbar vertebra, initial encounter for closed fracture: Secondary | ICD-10-CM | POA: Diagnosis not present

## 2017-08-11 DIAGNOSIS — M545 Low back pain: Secondary | ICD-10-CM | POA: Diagnosis not present

## 2017-08-11 DIAGNOSIS — I482 Chronic atrial fibrillation, unspecified: Secondary | ICD-10-CM

## 2017-08-11 DIAGNOSIS — S32040A Wedge compression fracture of fourth lumbar vertebra, initial encounter for closed fracture: Secondary | ICD-10-CM | POA: Diagnosis not present

## 2017-08-11 DIAGNOSIS — I5022 Chronic systolic (congestive) heart failure: Secondary | ICD-10-CM | POA: Diagnosis not present

## 2017-08-11 DIAGNOSIS — M5489 Other dorsalgia: Secondary | ICD-10-CM | POA: Diagnosis not present

## 2017-08-11 DIAGNOSIS — T148XXA Other injury of unspecified body region, initial encounter: Secondary | ICD-10-CM | POA: Diagnosis not present

## 2017-08-11 DIAGNOSIS — S32000A Wedge compression fracture of unspecified lumbar vertebra, initial encounter for closed fracture: Secondary | ICD-10-CM | POA: Diagnosis not present

## 2017-08-11 LAB — COMPREHENSIVE METABOLIC PANEL
ALK PHOS: 72 U/L (ref 38–126)
ALT: 22 U/L (ref 14–54)
ANION GAP: 8 (ref 5–15)
AST: 28 U/L (ref 15–41)
Albumin: 3.6 g/dL (ref 3.5–5.0)
BILIRUBIN TOTAL: 1.1 mg/dL (ref 0.3–1.2)
BUN: 18 mg/dL (ref 6–20)
CALCIUM: 9.2 mg/dL (ref 8.9–10.3)
CO2: 25 mmol/L (ref 22–32)
CREATININE: 0.79 mg/dL (ref 0.44–1.00)
Chloride: 102 mmol/L (ref 101–111)
Glucose, Bld: 173 mg/dL — ABNORMAL HIGH (ref 65–99)
Potassium: 4 mmol/L (ref 3.5–5.1)
Sodium: 135 mmol/L (ref 135–145)
TOTAL PROTEIN: 6.4 g/dL — AB (ref 6.5–8.1)

## 2017-08-11 LAB — CBC WITH DIFFERENTIAL/PLATELET
Basophils Absolute: 0 10*3/uL (ref 0.0–0.1)
Basophils Relative: 0 %
EOS ABS: 0 10*3/uL (ref 0.0–0.7)
Eosinophils Relative: 0 %
HEMATOCRIT: 35.4 % — AB (ref 36.0–46.0)
HEMOGLOBIN: 11.6 g/dL — AB (ref 12.0–15.0)
LYMPHS ABS: 0.7 10*3/uL (ref 0.7–4.0)
LYMPHS PCT: 6 %
MCH: 28.6 pg (ref 26.0–34.0)
MCHC: 32.8 g/dL (ref 30.0–36.0)
MCV: 87.4 fL (ref 78.0–100.0)
MONOS PCT: 3 %
Monocytes Absolute: 0.3 10*3/uL (ref 0.1–1.0)
NEUTROS PCT: 91 %
Neutro Abs: 9.5 10*3/uL — ABNORMAL HIGH (ref 1.7–7.7)
Platelets: 271 10*3/uL (ref 150–400)
RBC: 4.05 MIL/uL (ref 3.87–5.11)
RDW: 14.4 % (ref 11.5–15.5)
WBC: 10.4 10*3/uL (ref 4.0–10.5)

## 2017-08-11 LAB — PROTIME-INR
INR: 4.35 — AB
Prothrombin Time: 41.3 seconds — ABNORMAL HIGH (ref 11.4–15.2)

## 2017-08-11 MED ORDER — METOPROLOL SUCCINATE ER 100 MG PO TB24
100.0000 mg | ORAL_TABLET | Freq: Every evening | ORAL | Status: DC
  Administered 2017-08-11: 100 mg via ORAL
  Filled 2017-08-11: qty 1

## 2017-08-11 MED ORDER — METOPROLOL TARTRATE 5 MG/5ML IV SOLN
5.0000 mg | Freq: Once | INTRAVENOUS | Status: AC
  Administered 2017-08-11: 5 mg via INTRAVENOUS
  Filled 2017-08-11: qty 5

## 2017-08-11 NOTE — Discharge Instructions (Signed)
Tylenol for pain. Return if any problems.  See Dr. Shelia Media for recheck next week.

## 2017-08-11 NOTE — ED Provider Notes (Signed)
  Face-to-face evaluation   History: Patient presents for evaluation of chest pain which started today.  She also misstepped and fell backwards.  She complains of pain in her upper back.  Physical exam: Elderly, alert and cooperative.  Fair range of motion of arms and upper back.  Medical screening examination/treatment/procedure(s) were conducted as a shared visit with non-physician practitioner(s) and myself.  I personally evaluated the patient during the encounter   Daleen Bo, MD 08/13/17 [phone]

## 2017-08-11 NOTE — Progress Notes (Addendum)
Consult request has been received. CSW attempting to follow up at present time.  CSW notes in chart pt has flag for Legal Guardian.  Pt was at [facility] in October.  Per consult pt wants "placement".  CSW will follow up with EDP.  Per chart, pt's nephew is closely involved in prior hospitalization and may be the legal guardian.   Per chart, pt has [employer] Medicare and IF the pt, per chart is unlikely to have a rehab-able injury, that would be covered by insurance. Per notes, pt is up for D/C.  9:30 PM CSW consulted with EDP.  Pt demonstrates no acute injuries, per EDP, and CT scans show nothing that can be determined as acute and also that chronic spinal problems are not able to be determined as anything but age-related and chronic in nature.  Per EDP there is no clear evidence that anything has changed since pt's last admission.  CSW spoke with pt's LG/POA Burton,Charles at [phone] who is insistent pt needs placement.  CSW explained the need for insurance to authorize payment or SNF rehab and that the pt demonstrates, per the EDP and the notes, that there are no clearly identifiable acute findings that would indicate a rehab-able injury at this time and thus, it would be highly unlikely that [place] Medicare would authorize this pt for SNF at this time.    Pt's nephew stated that this was not how the pt's injuries were "explained to me last time" and CSW stated it is the documentation of the pt's injuries that the insurance company makes the authorization judgement on and not the verbal explanation or lack thereof.  Pt's nephew stated he understood this.  Pt stated he should have been aware of this before 8pm and CSW pointed out the pt's nephew did not make it clear to the EDP that the pt's nephew wanted placement for the pt until approx 7pm and that the CSW responded at that time by reviewing chart, speaking to the EDP and then calling the pt's newphew.  Pt;'s nephew stated this makes  sense.  CSw offered to provide a SNF list and leave it on the pt's bedside stand for the pt's nephew and educated the pt's nephew on taking the pt to the pt's PCP and requesting assistance with SNF placement  And with a second opinion from the PCP. Pt's nephew accepted the list verbally and CSW went to pt's room and gave it to the pt and placed it on the pt's bedside stand and requested the pt or the pt's newphew call again if there were any additional questions or needs. Pt's nephew did not indicate he was willing to private pay at this time, nor could the pt, when pt's nephew was asked by the CSW.  Pt's nephew was appreciative and thanked the CSW.  CSW updated  EDP.    11:11 PM EDP stated pt's nephew not answering phone.  CSW asked EDP to place CM consult for [facility]. RN will continue to call for pt's nephew.  11:48: ED CSW called again, got no answer from pt's nephew and CSW left a handoff for daytime ED CSW.  Please reconsult if future social work needs arise.  CSW signing off, as social work intervention is no longer needed.  Alphonse Guild. Freada Twersky, LCSW, LCAS, CSI Clinical Social Worker Ph: [phone]      '

## 2017-08-11 NOTE — ED Triage Notes (Signed)
Transported by [facility] from home; pt reports that she tripped and fell this morning in her kitchen injuring the base of her neck (between shoulder blades) and her lower back. Pain increases with movement. Pt is on Warfarin r/t A. Fib. Denies hitting her head, denies any LOC. A & O x 4. C-Collar placed by EMS.

## 2017-08-11 NOTE — ED Provider Notes (Signed)
[facility] DEPT Provider Note   CSN: [id number] Arrival date & time: 08/11/17  1121     History   Chief Complaint Chief Complaint  Patient presents with  . Fall    HPI Wendy Flowers is a 83 y.o. female.  The history is provided by the patient. No language interpreter was used.  Fall  This is a new problem. The current episode started 3 to 5 hours ago. The problem has not changed since onset.Pertinent negatives include no chest pain. The symptoms are aggravated by walking. Nothing relieves the symptoms. She has tried nothing for the symptoms. The treatment provided no relief.  Pt reports she tripped and fell at home. Pt fell backwards.  Pt his shoulder.  Pt reports she may have hit her head but has no pain Pt reports she has a history of compression fractures.  Pt complains of pain in her neck and her upper back.  Pt is concerned she could have broken something.  Pt is here with son.  Pt lives next door to him.    Past Medical History:  Diagnosis Date  . Lower extremity edema   . Permanent atrial fibrillation ([place])   . Pulmonary hypertension [facility])     Patient Active Problem List   Diagnosis Date Noted  . NICM (nonischemic cardiomyopathy) ([place]) 06/27/2017  . Age-related osteoporosis with current pathological fracture 06/01/2017  . Hypothyroidism 05/14/2017  . Fall 05/13/2017  . Supratherapeutic INR 05/13/2017  . Meningioma ([place]) 05/13/2017  . Chronic systolic CHF (congestive heart failure) ([place]) 05/13/2017  . HLD (hyperlipidemia) 05/13/2017  . Compression fracture of lumbar vertebra ([place]) 05/13/2017  . Restrictive lung disease due to kyphoscoliosis 06/02/2014  . Chronic anticoagulation 02/28/2014  . Kyphosis 02/28/2014  . Memory changes 05/15/2013  . Essential hypertension 03/22/2010  . Pulmonary arterial hypertension ([place]) 03/22/2010  . Permanent atrial fibrillation ([place]) 03/21/2010    Past Surgical History:  Procedure Laterality Date    . BREAST LUMPECTOMY  0263   left  . CARDIAC CATHETERIZATION  06/13/2010   R & LHC: no CAD  . CATARACT EXTRACTION  2008   bilateral  . [place]   left  . US ECHOCARDIOGRAPHY  02/04/2012   LA mod. dilated,RA mild-mod dilated,mild MR,mod. TR    OB History    No data available       Home Medications    Prior to Admission medications   Medication Sig Start Date End Date Taking? Authorizing Provider  acetaminophen (TYLENOL) 325 MG tablet Take 650 mg by mouth every 6 (six) hours as needed for moderate pain.    Yes [provider]  Calcium Carb-Cholecalciferol (CALCIUM + D3) 600-200 MG-UNIT TABS Take 1 tablet by mouth 2 (two) times daily.   Yes [provider]  escitalopram (LEXAPRO) 10 MG tablet TK 0.5 tablet (5 mg) T PO QD 08/07/17  Yes [provider]  furosemide (LASIX) 40 MG tablet Take 40 mg by mouth daily.    Yes [provider]  levothyroxine (SYNTHROID, LEVOTHROID) 137 MCG tablet Take 137 mcg by mouth daily before breakfast.   Yes [provider]  metoprolol succinate (TOPROL-XL) 50 MG 24 hr tablet Take 100 mg by mouth every evening.  03/28/17  Yes [provider]  PROLIA 60 MG/ML SOLN injection Inject 60 mg into the skin every 6 (six) months. 11/14/17  Yes [provider]  simvastatin (ZOCOR) 40 MG tablet TK 1 T PO QD 07/10/17  Yes [provider]  SYNTHROID 137 MCG tablet Take 137 mcg by mouth at bedtime.  03/27/17  Yes [provider]  warfarin (COUMADIN) 2 MG tablet TK 1 T PO QD OR UTD 06/10/17  Yes [provider]  atorvastatin (LIPITOR) 20 MG tablet Take 20 mg by mouth at bedtime.    [provider]  oxyCODONE (OXY IR/ROXICODONE) 5 MG immediate release tablet Take 5 mg by mouth every 8 (eight) hours as needed for severe pain. USE ONLY FOR PAIN NOT RELIEVED BY TRAMADOL    [provider]  traMADol (ULTRAM) 50 MG tablet Give 0.5 tablet by mouth every 8 hours as  needed for pain    [provider]  warfarin (COUMADIN) 4 MG tablet Take 4 mg by mouth daily.    [provider]    Family History Family History  Problem Relation Age of Onset  . Heart attack Mother   . Heart attack Father   . Diabetes Brother   . Cancer Brother     Social History Social History   Tobacco Use  . Smoking status: Never Smoker  . Smokeless tobacco: Never Used  Substance Use Topics  . Alcohol use: Yes    Comment: seldom; 03/17/2013-occasionally drinks a glass of wine before bed  . Drug use: No     Allergies   Foradil [formoterol]   Review of Systems Review of Systems  Cardiovascular: Negative for chest pain.  All other systems reviewed and are negative.    Physical Exam Updated Vital Signs BP (!) 135/103   Pulse 90   Temp (!) 97.4 F (36.3 C) (Oral)   Resp 16   SpO2 94%   Physical Exam  Constitutional: She appears well-developed and well-nourished. No distress.  HENT:  Head: Normocephalic and atraumatic.  Right Ear: External ear normal.  Left Ear: External ear normal.  Mouth/Throat: Oropharynx is clear and moist.  No bruising occipital scalp, no hematoma  Eyes: Conjunctivae are normal.  Neck: Normal range of motion. Neck supple.  Cardiovascular: Normal rate and regular rhythm.  No murmur heard. Pulmonary/Chest: Effort normal and breath sounds normal. No respiratory distress.  Abdominal: Soft. There is no tenderness.  Musculoskeletal: She exhibits no edema.  Tender lower cervical spine and upper thoracic spine,  Slight tender ls spine to palpation    Neurological: She is alert.  Skin: Skin is warm and dry.  Psychiatric: She has a normal mood and affect.  Nursing note and vitals reviewed.    ED Treatments / Results  Labs (all labs ordered are listed, but only abnormal results are displayed) Labs Reviewed - No data to display  EKG  EKG Interpretation None       Radiology Dg Thoracic Spine 2 View  Result  Date: 08/11/2017 CLINICAL DATA:  Fall back pain EXAM: THORACIC SPINE 2 VIEWS COMPARISON:  CT CHEST 02/05/2014 FINDINGS: Thoracolumbar scoliosis. Generalized osteopenia. These factors limit evaluation for fracture. Compression fractures in the mid and lower thoracic spine including T7, T8, T11 are noted on the prior study. IMPRESSION: Image quality degraded by scoliosis and osteopenia. Multiple thoracic fractures appear chronic. If the patient has acute pain, MRI suggested for further evaluation. Electronically Signed   By: Franchot Gallo M.D.   On: 08/11/2017 14:06   Dg Lumbar Spine Complete  Result Date: 08/11/2017 CLINICAL DATA:  Lower back pain after fall. EXAM: LUMBAR SPINE - COMPLETE 4+ VIEW COMPARISON:  CT lumbar spine dated May 13, 2017. FINDINGS: Unchanged levoscoliosis of the lower thoracic and upper lumbar  spine. Relatively unchanged compression deformities involving the T12, L1, and L2 vertebral bodies. No definite new vertebral body height loss. Alignment is normal. Mild lumbar facet arthropathy is unchanged. The bones are diffusely osteopenic. IMPRESSION: No definite acute compression deformity, although evaluation is limited due to severe osteopenia and scoliosis. If there is strong clinical concern for fracture, recommend CT for further evaluation. Electronically Signed   By: Titus Dubin M.D.   On: 08/11/2017 14:14   Ct Head Wo Contrast  Result Date: 08/11/2017 CLINICAL DATA:  Tripped and fell this morning in her kitchen injuring the base of her neck and lower back. Chronic anticoagulation. EXAM: CT HEAD WITHOUT CONTRAST CT CERVICAL SPINE WITHOUT CONTRAST TECHNIQUE: Multidetector CT imaging of the head and cervical spine was performed following the standard protocol without intravenous contrast. Multiplanar CT image reconstructions of the cervical spine were also generated. COMPARISON:  05/13/2017 FINDINGS: CT HEAD FINDINGS Brain: There is no evidence for acute hemorrhage,  hydrocephalus, or abnormal extra-axial fluid collection. 10 mm calcified extra-axial lesion in the left middle cranial fossa is stable. No definite CT evidence for acute infarction. Diffuse loss of parenchymal volume is consistent with atrophy. Patchy low attenuation in the deep hemispheric and periventricular white matter is nonspecific, but likely reflects chronic microvascular ischemic demyelination. Vascular: No hyperdense vessel or unexpected calcification. Skull: No evidence for fracture. No worrisome lytic or sclerotic lesion. Sinuses/Orbits: The visualized paranasal sinuses and mastoid air cells are clear. Visualized portions of the globes and intraorbital fat are unremarkable. Other: None. CT CERVICAL SPINE FINDINGS Alignment: Accentuated lordosis without subluxation. Skull base and vertebrae: No acute fracture in the cervical spine. There is compression deformity at T3, incompletely visualized potentially at T4. No primary bone lesion or focal pathologic process. Soft tissues and spinal canal: No prevertebral fluid or swelling. No visible canal hematoma. Disc levels:  Mild loss of disc height noted C5-6. Upper chest: Unremarkable. Other: None. IMPRESSION: 1. No acute intracranial abnormality. Atrophy with chronic small vessel white matter ischemic disease. 2. 10 mm partially calcified extra-axial lesion in the left middle cranial fossa, stable in the interval. This may well represent meningioma. 3. No evidence for an acute cervical spine fracture although compression deformity is noted at T3 and potentially at T4. These levels were not included on the previous cervical spine CT. Dedicated thoracic spine imaging could be used to further evaluate as clinically warranted. Electronically Signed   By: Misty Stanley M.D.   On: 08/11/2017 14:40   Ct Cervical Spine Wo Contrast  Result Date: 08/11/2017 CLINICAL DATA:  Tripped and fell this morning in her kitchen injuring the base of her neck and lower back.  Chronic anticoagulation. EXAM: CT HEAD WITHOUT CONTRAST CT CERVICAL SPINE WITHOUT CONTRAST TECHNIQUE: Multidetector CT imaging of the head and cervical spine was performed following the standard protocol without intravenous contrast. Multiplanar CT image reconstructions of the cervical spine were also generated. COMPARISON:  05/13/2017 FINDINGS: CT HEAD FINDINGS Brain: There is no evidence for acute hemorrhage, hydrocephalus, or abnormal extra-axial fluid collection. 10 mm calcified extra-axial lesion in the left middle cranial fossa is stable. No definite CT evidence for acute infarction. Diffuse loss of parenchymal volume is consistent with atrophy. Patchy low attenuation in the deep hemispheric and periventricular white matter is nonspecific, but likely reflects chronic microvascular ischemic demyelination. Vascular: No hyperdense vessel or unexpected calcification. Skull: No evidence for fracture. No worrisome lytic or sclerotic lesion. Sinuses/Orbits: The visualized paranasal sinuses and mastoid air cells are clear. Visualized portions of  the globes and intraorbital fat are unremarkable. Other: None. CT CERVICAL SPINE FINDINGS Alignment: Accentuated lordosis without subluxation. Skull base and vertebrae: No acute fracture in the cervical spine. There is compression deformity at T3, incompletely visualized potentially at T4. No primary bone lesion or focal pathologic process. Soft tissues and spinal canal: No prevertebral fluid or swelling. No visible canal hematoma. Disc levels:  Mild loss of disc height noted C5-6. Upper chest: Unremarkable. Other: None. IMPRESSION: 1. No acute intracranial abnormality. Atrophy with chronic small vessel white matter ischemic disease. 2. 10 mm partially calcified extra-axial lesion in the left middle cranial fossa, stable in the interval. This may well represent meningioma. 3. No evidence for an acute cervical spine fracture although compression deformity is noted at T3 and  potentially at T4. These levels were not included on the previous cervical spine CT. Dedicated thoracic spine imaging could be used to further evaluate as clinically warranted. Electronically Signed   By: Misty Stanley M.D.   On: 08/11/2017 14:40   Ct Thoracic Spine Wo Contrast  Result Date: 08/11/2017 CLINICAL DATA:  Patient tripped and fell this morning complaining of lower back pain. EXAM: CT THORACIC SPINE WITHOUT CONTRAST; CT LUMBAR SPINE WITHOUT CONTRAST TECHNIQUE: Multidetector CT images of the thoracic were obtained using the standard protocol without intravenous contrast. COMPARISON:  Chest CT 02/05/2014 FINDINGS: Alignment: Accentuation of mid to lower thoracic kyphosis attributable progression of multilevel degenerative disc disease, in particular spanning T5 through T9. Vertebrae: New since 2015 is a moderate T3 compression fracture without retropulsion with roughly 50% height loss. Its biconcave appearance would suggest an osteoporotic compression. Chronic stable mild compression fracture of T6, moderate compression of T8 and mild-to-moderate at T11. Slight progression in mild moderate compression of T12. Stable marked osteoporotic compression of L1 . Progression of L2 compression fracture, now biconcave in appearance without to 50% height loss since prior. Progression of mild-to-moderate L4 biconcave compression fracture since prior. Chronic moderate facet arthropathy with joint space narrowing and hypertrophy with remodeling from L3 through S1. Intact sacral ala and sacroiliac joints. Paraspinal and other soft tissues: Bilateral small to moderate pleural effusions with adjacent compressive atelectasis. There is aortic atherosclerosis without aneurysmal dilatation. No paraspinal hematoma nor retroperitoneal hemorrhage. 18 mm well-circumscribed hypodensity in the right hepatic lobe statistically more likely to represent a cyst or hemangioma. Stable cardiomegaly. Disc levels: No significant central  canal stenosis. IMPRESSION: Thoracic spine: 1. New since 2015 comparison chest CT is a moderate T3 compression fracture without retropulsion with roughly 50% height loss. 2. Chronic stable mild T6, moderate T8 and mild-to-moderate T11 compre compression fractures. 3. Small to moderate bilateral pleural effusions with atelectasis. Stable cardiomegaly. 4. 18 mm well-circumscribed hypodensity in the right hepatic lobe more likely to represent a cyst or hemangioma. Lumbar spine: 1. Progression of L2 and L4 compression fractures since 2015, moderate at L2 with 50% height loss and mild-to-moderate at L4. 2. Chronic stable marked osteoporotic compression fracture of L1. 3. Re- demonstration of lower lumbar facet arthropathy. Electronically Signed   By: Ashley Royalty M.D.   On: 08/11/2017 16:44   Ct Lumbar Spine Wo Contrast  Result Date: 08/11/2017 CLINICAL DATA:  Patient tripped and fell this morning complaining of lower back pain. EXAM: CT THORACIC SPINE WITHOUT CONTRAST; CT LUMBAR SPINE WITHOUT CONTRAST TECHNIQUE: Multidetector CT images of the thoracic were obtained using the standard protocol without intravenous contrast. COMPARISON:  Chest CT 02/05/2014 FINDINGS: Alignment: Accentuation of mid to lower thoracic kyphosis attributable progression of multilevel degenerative  disc disease, in particular spanning T5 through T9. Vertebrae: New since 2015 is a moderate T3 compression fracture without retropulsion with roughly 50% height loss. Its biconcave appearance would suggest an osteoporotic compression. Chronic stable mild compression fracture of T6, moderate compression of T8 and mild-to-moderate at T11. Slight progression in mild moderate compression of T12. Stable marked osteoporotic compression of L1 . Progression of L2 compression fracture, now biconcave in appearance without to 50% height loss since prior. Progression of mild-to-moderate L4 biconcave compression fracture since prior. Chronic moderate facet  arthropathy with joint space narrowing and hypertrophy with remodeling from L3 through S1. Intact sacral ala and sacroiliac joints. Paraspinal and other soft tissues: Bilateral small to moderate pleural effusions with adjacent compressive atelectasis. There is aortic atherosclerosis without aneurysmal dilatation. No paraspinal hematoma nor retroperitoneal hemorrhage. 18 mm well-circumscribed hypodensity in the right hepatic lobe statistically more likely to represent a cyst or hemangioma. Stable cardiomegaly. Disc levels: No significant central canal stenosis. IMPRESSION: Thoracic spine: 1. New since 2015 comparison chest CT is a moderate T3 compression fracture without retropulsion with roughly 50% height loss. 2. Chronic stable mild T6, moderate T8 and mild-to-moderate T11 compre compression fractures. 3. Small to moderate bilateral pleural effusions with atelectasis. Stable cardiomegaly. 4. 18 mm well-circumscribed hypodensity in the right hepatic lobe more likely to represent a cyst or hemangioma. Lumbar spine: 1. Progression of L2 and L4 compression fractures since 2015, moderate at L2 with 50% height loss and mild-to-moderate at L4. 2. Chronic stable marked osteoporotic compression fracture of L1. 3. Re- demonstration of lower lumbar facet arthropathy. Electronically Signed   By: Ashley Royalty M.D.   On: 08/11/2017 16:44    Procedures Procedures (including critical care time)  Medications Ordered in ED Medications - No data to display   Initial Impression / Assessment and Plan / ED Course  I have reviewed the triage vital signs and the nursing notes.  Pertinent labs & imaging results that were available during my care of the patient were reviewed by me and considered in my medical decision making (see chart for details).   Ct head obtained due to fall.  Pt is on coumadin.  Ct shows no evidence of bleeding   Pt has multiple compression fractures, most appear old,  Pt is able to stand and walk  with minimal difficulty.  Pt has a walker at home she can use.  I advised tylenol for pain.     Pt's family report they do not feel pt can go home.  Family requesting pt be admitted to Nursing home for rehab.  Family reports pt not able to care for herself at home. Social work consulted. He gave family member facilities list and advised follow up with primary MD.  Pt noted to have increased heart rate.  Pt has afib in 140's.  Pt given metoprolol 5mg  iv.  No change in rate.  Pt given her evening dosage of metoprolol.   Revaluation heart rate 107.   Final Clinical Impressions(s) / ED Diagnoses   Final diagnoses:  Fall, initial encounter  Compression fracture of spine [facility])    ED Discharge Orders    None    Tylenol    Fransico Meadow, PA-C 08/11/17 1859    Fransico Meadow, PA-C 08/11/17 1955    Fransico Meadow, PA-C 08/11/17 2250    Fransico Meadow, [place] 08/11/17 2325    Daleen Bo, MD 08/13/17 [phone]

## 2017-08-11 NOTE — Progress Notes (Addendum)
Pt's nephew is now not answering phone.  EDP and RN updated.  ED CSW will leave handoff for 1st shift ED CSW. RN will continue to call pt's pt's nephew.  Please reconsult if future social work needs arise.  CSW signing off, as social work intervention is no longer needed.  Alphonse Guild. Merl Bommarito, LCSW, LCAS, CSI Clinical Social Worker Ph: [phone]

## 2017-08-11 NOTE — ED Notes (Signed)
Pt ambulated well with walker assist. Pt walked from room to hallway and back. Only complaint was back pain.

## 2017-08-12 NOTE — ED Notes (Signed)
Attempted to call pts Nephew in order to arrange transport. Left a voicemail regarding pts d/c status.

## 2017-08-13 ENCOUNTER — Telehealth (HOSPITAL_COMMUNITY): Payer: Self-pay | Admitting: *Deleted

## 2017-08-13 DIAGNOSIS — R69 Illness, unspecified: Secondary | ICD-10-CM | POA: Diagnosis not present

## 2017-08-13 DIAGNOSIS — M810 Age-related osteoporosis without current pathological fracture: Secondary | ICD-10-CM | POA: Diagnosis not present

## 2017-08-13 DIAGNOSIS — E039 Hypothyroidism, unspecified: Secondary | ICD-10-CM | POA: Diagnosis not present

## 2017-08-13 DIAGNOSIS — I482 Chronic atrial fibrillation: Secondary | ICD-10-CM | POA: Diagnosis not present

## 2017-08-13 DIAGNOSIS — I5022 Chronic systolic (congestive) heart failure: Secondary | ICD-10-CM | POA: Diagnosis not present

## 2017-08-13 DIAGNOSIS — I10 Essential (primary) hypertension: Secondary | ICD-10-CM | POA: Diagnosis not present

## 2017-08-13 DIAGNOSIS — R609 Edema, unspecified: Secondary | ICD-10-CM | POA: Diagnosis not present

## 2017-08-13 DIAGNOSIS — R413 Other amnesia: Secondary | ICD-10-CM | POA: Diagnosis not present

## 2017-08-13 DIAGNOSIS — M545 Low back pain: Secondary | ICD-10-CM | POA: Diagnosis not present

## 2017-08-13 DIAGNOSIS — E7800 Pure hypercholesterolemia, unspecified: Secondary | ICD-10-CM | POA: Diagnosis not present

## 2017-08-13 NOTE — Telephone Encounter (Signed)
Pt in ED for falls with afib.  Pt permanent afib but no recent cardiology follow ups.  I called to check on the patient and to find out if she was being transferred to SNF.  Phone continued to ring with no answer

## 2017-08-19 ENCOUNTER — Telehealth: Payer: Self-pay | Admitting: *Deleted

## 2017-08-19 ENCOUNTER — Ambulatory Visit: Payer: Medicare HMO | Admitting: Neurology

## 2017-08-19 NOTE — Telephone Encounter (Signed)
No show - canceled new patient appt same day.

## 2017-08-20 DIAGNOSIS — E789 Disorder of lipoprotein metabolism, unspecified: Secondary | ICD-10-CM | POA: Diagnosis not present

## 2017-08-20 DIAGNOSIS — Z0000 Encounter for general adult medical examination without abnormal findings: Secondary | ICD-10-CM | POA: Diagnosis not present

## 2017-08-27 DIAGNOSIS — R319 Hematuria, unspecified: Secondary | ICD-10-CM | POA: Diagnosis not present

## 2017-08-27 DIAGNOSIS — I4891 Unspecified atrial fibrillation: Secondary | ICD-10-CM | POA: Diagnosis not present

## 2017-08-27 DIAGNOSIS — M810 Age-related osteoporosis without current pathological fracture: Secondary | ICD-10-CM | POA: Diagnosis not present

## 2017-08-27 DIAGNOSIS — E039 Hypothyroidism, unspecified: Secondary | ICD-10-CM | POA: Diagnosis not present

## 2017-08-27 DIAGNOSIS — N390 Urinary tract infection, site not specified: Secondary | ICD-10-CM | POA: Diagnosis not present

## 2017-08-27 DIAGNOSIS — I10 Essential (primary) hypertension: Secondary | ICD-10-CM | POA: Diagnosis not present

## 2017-08-27 DIAGNOSIS — R69 Illness, unspecified: Secondary | ICD-10-CM | POA: Diagnosis not present

## 2017-08-27 DIAGNOSIS — E7800 Pure hypercholesterolemia, unspecified: Secondary | ICD-10-CM | POA: Diagnosis not present

## 2017-08-27 DIAGNOSIS — Z0000 Encounter for general adult medical examination without abnormal findings: Secondary | ICD-10-CM | POA: Diagnosis not present

## 2017-08-27 DIAGNOSIS — I5022 Chronic systolic (congestive) heart failure: Secondary | ICD-10-CM | POA: Diagnosis not present

## 2017-08-27 DIAGNOSIS — S32010D Wedge compression fracture of first lumbar vertebra, subsequent encounter for fracture with routine healing: Secondary | ICD-10-CM | POA: Diagnosis not present

## 2017-08-29 DIAGNOSIS — Z7901 Long term (current) use of anticoagulants: Secondary | ICD-10-CM | POA: Diagnosis not present

## 2017-08-29 DIAGNOSIS — I4891 Unspecified atrial fibrillation: Secondary | ICD-10-CM | POA: Diagnosis not present

## 2017-09-02 DIAGNOSIS — I4891 Unspecified atrial fibrillation: Secondary | ICD-10-CM | POA: Diagnosis not present

## 2017-09-02 DIAGNOSIS — Z7901 Long term (current) use of anticoagulants: Secondary | ICD-10-CM | POA: Diagnosis not present

## 2017-09-03 DIAGNOSIS — R600 Localized edema: Secondary | ICD-10-CM | POA: Diagnosis not present

## 2017-09-03 DIAGNOSIS — E778 Other disorders of glycoprotein metabolism: Secondary | ICD-10-CM | POA: Diagnosis not present

## 2017-09-03 DIAGNOSIS — I4891 Unspecified atrial fibrillation: Secondary | ICD-10-CM | POA: Diagnosis not present

## 2017-09-04 ENCOUNTER — Emergency Department (HOSPITAL_COMMUNITY): Payer: Medicare HMO

## 2017-09-04 ENCOUNTER — Inpatient Hospital Stay (HOSPITAL_COMMUNITY): Payer: Medicare HMO

## 2017-09-04 ENCOUNTER — Inpatient Hospital Stay (HOSPITAL_COMMUNITY)
Admission: EM | Admit: 2017-09-04 | Discharge: 2017-09-09 | DRG: 308 | Disposition: A | Discharge status: Skilled Nursing Facility | Payer: Medicare HMO | Attending: Internal Medicine | Admitting: Internal Medicine

## 2017-09-04 ENCOUNTER — Other Ambulatory Visit: Payer: Self-pay

## 2017-09-04 ENCOUNTER — Encounter (HOSPITAL_COMMUNITY): Payer: Self-pay

## 2017-09-04 DIAGNOSIS — I2729 Other secondary pulmonary hypertension: Secondary | ICD-10-CM | POA: Diagnosis present

## 2017-09-04 DIAGNOSIS — I10 Essential (primary) hypertension: Secondary | ICD-10-CM | POA: Diagnosis not present

## 2017-09-04 DIAGNOSIS — E875 Hyperkalemia: Secondary | ICD-10-CM | POA: Diagnosis not present

## 2017-09-04 DIAGNOSIS — L8990 Pressure ulcer of unspecified site, unspecified stage: Secondary | ICD-10-CM | POA: Diagnosis not present

## 2017-09-04 DIAGNOSIS — E039 Hypothyroidism, unspecified: Secondary | ICD-10-CM | POA: Diagnosis present

## 2017-09-04 DIAGNOSIS — Z9181 History of falling: Secondary | ICD-10-CM | POA: Diagnosis not present

## 2017-09-04 DIAGNOSIS — Z6821 Body mass index (BMI) 21.0-21.9, adult: Secondary | ICD-10-CM

## 2017-09-04 DIAGNOSIS — R489 Unspecified symbolic dysfunctions: Secondary | ICD-10-CM | POA: Diagnosis not present

## 2017-09-04 DIAGNOSIS — S299XXA Unspecified injury of thorax, initial encounter: Secondary | ICD-10-CM | POA: Diagnosis not present

## 2017-09-04 DIAGNOSIS — Z7901 Long term (current) use of anticoagulants: Secondary | ICD-10-CM

## 2017-09-04 DIAGNOSIS — S22000A Wedge compression fracture of unspecified thoracic vertebra, initial encounter for closed fracture: Secondary | ICD-10-CM | POA: Diagnosis not present

## 2017-09-04 DIAGNOSIS — L89153 Pressure ulcer of sacral region, stage 3: Secondary | ICD-10-CM | POA: Diagnosis present

## 2017-09-04 DIAGNOSIS — I959 Hypotension, unspecified: Secondary | ICD-10-CM | POA: Diagnosis present

## 2017-09-04 DIAGNOSIS — E785 Hyperlipidemia, unspecified: Secondary | ICD-10-CM | POA: Diagnosis not present

## 2017-09-04 DIAGNOSIS — I482 Chronic atrial fibrillation: Secondary | ICD-10-CM | POA: Diagnosis not present

## 2017-09-04 DIAGNOSIS — I5023 Acute on chronic systolic (congestive) heart failure: Secondary | ICD-10-CM | POA: Diagnosis not present

## 2017-09-04 DIAGNOSIS — M4184 Other forms of scoliosis, thoracic region: Secondary | ICD-10-CM | POA: Diagnosis present

## 2017-09-04 DIAGNOSIS — R0902 Hypoxemia: Secondary | ICD-10-CM | POA: Diagnosis not present

## 2017-09-04 DIAGNOSIS — N179 Acute kidney failure, unspecified: Secondary | ICD-10-CM | POA: Diagnosis present

## 2017-09-04 DIAGNOSIS — D329 Benign neoplasm of meninges, unspecified: Secondary | ICD-10-CM | POA: Diagnosis not present

## 2017-09-04 DIAGNOSIS — S0990XA Unspecified injury of head, initial encounter: Secondary | ICD-10-CM | POA: Diagnosis not present

## 2017-09-04 DIAGNOSIS — Z66 Do not resuscitate: Secondary | ICD-10-CM | POA: Diagnosis not present

## 2017-09-04 DIAGNOSIS — J9601 Acute respiratory failure with hypoxia: Secondary | ICD-10-CM | POA: Diagnosis present

## 2017-09-04 DIAGNOSIS — J810 Acute pulmonary edema: Secondary | ICD-10-CM

## 2017-09-04 DIAGNOSIS — M6281 Muscle weakness (generalized): Secondary | ICD-10-CM | POA: Diagnosis not present

## 2017-09-04 DIAGNOSIS — I110 Hypertensive heart disease with heart failure: Secondary | ICD-10-CM | POA: Diagnosis present

## 2017-09-04 DIAGNOSIS — Z833 Family history of diabetes mellitus: Secondary | ICD-10-CM

## 2017-09-04 DIAGNOSIS — S199XXA Unspecified injury of neck, initial encounter: Secondary | ICD-10-CM | POA: Diagnosis not present

## 2017-09-04 DIAGNOSIS — R791 Abnormal coagulation profile: Secondary | ICD-10-CM | POA: Diagnosis not present

## 2017-09-04 DIAGNOSIS — I5022 Chronic systolic (congestive) heart failure: Secondary | ICD-10-CM | POA: Diagnosis not present

## 2017-09-04 DIAGNOSIS — Y92009 Unspecified place in unspecified non-institutional (private) residence as the place of occurrence of the external cause: Secondary | ICD-10-CM

## 2017-09-04 DIAGNOSIS — F0390 Unspecified dementia without behavioral disturbance: Secondary | ICD-10-CM | POA: Diagnosis present

## 2017-09-04 DIAGNOSIS — M8008XA Age-related osteoporosis with current pathological fracture, vertebra(e), initial encounter for fracture: Secondary | ICD-10-CM | POA: Diagnosis present

## 2017-09-04 DIAGNOSIS — E876 Hypokalemia: Secondary | ICD-10-CM | POA: Diagnosis not present

## 2017-09-04 DIAGNOSIS — W19XXXA Unspecified fall, initial encounter: Secondary | ICD-10-CM | POA: Diagnosis not present

## 2017-09-04 DIAGNOSIS — I4891 Unspecified atrial fibrillation: Secondary | ICD-10-CM | POA: Diagnosis present

## 2017-09-04 DIAGNOSIS — M40209 Unspecified kyphosis, site unspecified: Secondary | ICD-10-CM | POA: Diagnosis not present

## 2017-09-04 DIAGNOSIS — G4489 Other headache syndrome: Secondary | ICD-10-CM | POA: Diagnosis not present

## 2017-09-04 DIAGNOSIS — R54 Age-related physical debility: Secondary | ICD-10-CM | POA: Diagnosis present

## 2017-09-04 DIAGNOSIS — J9600 Acute respiratory failure, unspecified whether with hypoxia or hypercapnia: Secondary | ICD-10-CM | POA: Diagnosis not present

## 2017-09-04 DIAGNOSIS — J984 Other disorders of lung: Secondary | ICD-10-CM | POA: Diagnosis not present

## 2017-09-04 DIAGNOSIS — Z7189 Other specified counseling: Secondary | ICD-10-CM | POA: Diagnosis not present

## 2017-09-04 DIAGNOSIS — R69 Illness, unspecified: Secondary | ICD-10-CM | POA: Diagnosis not present

## 2017-09-04 DIAGNOSIS — Z8249 Family history of ischemic heart disease and other diseases of the circulatory system: Secondary | ICD-10-CM

## 2017-09-04 DIAGNOSIS — S32000A Wedge compression fracture of unspecified lumbar vertebra, initial encounter for closed fracture: Secondary | ICD-10-CM | POA: Diagnosis not present

## 2017-09-04 DIAGNOSIS — R0602 Shortness of breath: Secondary | ICD-10-CM | POA: Diagnosis not present

## 2017-09-04 DIAGNOSIS — E43 Unspecified severe protein-calorie malnutrition: Secondary | ICD-10-CM | POA: Diagnosis not present

## 2017-09-04 HISTORY — DX: Unspecified dementia, unspecified severity, without behavioral disturbance, psychotic disturbance, mood disturbance, and anxiety: F03.90

## 2017-09-04 HISTORY — DX: Hypertensive heart disease with heart failure: I11.0

## 2017-09-04 HISTORY — DX: Hyperlipidemia, unspecified: E78.5

## 2017-09-04 HISTORY — DX: Unspecified fall, initial encounter: W19.XXXA

## 2017-09-04 HISTORY — DX: Essential (primary) hypertension: I10

## 2017-09-04 HISTORY — DX: Chronic systolic (congestive) heart failure: I50.22

## 2017-09-04 HISTORY — DX: Age-related osteoporosis without current pathological fracture: M81.0

## 2017-09-04 HISTORY — DX: Hypothyroidism, unspecified: E03.9

## 2017-09-04 HISTORY — DX: Wedge compression fracture of unspecified thoracic vertebra, initial encounter for closed fracture: S22.000A

## 2017-09-04 LAB — CBC
HCT: 44.8 % (ref 36.0–46.0)
Hemoglobin: 14.5 g/dL (ref 12.0–15.0)
MCH: 28.2 pg (ref 26.0–34.0)
MCHC: 32.4 g/dL (ref 30.0–36.0)
MCV: 87.2 fL (ref 78.0–100.0)
PLATELETS: 262 10*3/uL (ref 150–400)
RBC: 5.14 MIL/uL — ABNORMAL HIGH (ref 3.87–5.11)
RDW: 15.2 % (ref 11.5–15.5)
WBC: 9.2 10*3/uL (ref 4.0–10.5)

## 2017-09-04 LAB — PROTIME-INR
INR: 1.88
PROTHROMBIN TIME: 21.4 s — AB (ref 11.4–15.2)

## 2017-09-04 LAB — BASIC METABOLIC PANEL
ANION GAP: 12 (ref 5–15)
BUN: 24 mg/dL — AB (ref 6–20)
CALCIUM: 9.8 mg/dL (ref 8.9–10.3)
CO2: 29 mmol/L (ref 22–32)
CREATININE: 1.14 mg/dL — AB (ref 0.44–1.00)
Chloride: 94 mmol/L — ABNORMAL LOW (ref 101–111)
GFR calc Af Amer: 48 mL/min — ABNORMAL LOW (ref >60)
GFR, EST NON AFRICAN AMERICAN: 42 mL/min — AB (ref >60)
GLUCOSE: 140 mg/dL — AB (ref 65–99)
Potassium: 4.2 mmol/L (ref 3.5–5.1)
Sodium: 135 mmol/L (ref 135–145)

## 2017-09-04 LAB — RESPIRATORY PANEL BY PCR
ADENOVIRUS-RVPPCR: NOT DETECTED
Bordetella pertussis: NOT DETECTED
CORONAVIRUS HKU1-RVPPCR: NOT DETECTED
CORONAVIRUS NL63-RVPPCR: NOT DETECTED
CORONAVIRUS OC43-RVPPCR: NOT DETECTED
Chlamydophila pneumoniae: NOT DETECTED
Coronavirus 229E: NOT DETECTED
Influenza A: NOT DETECTED
Influenza B: NOT DETECTED
MYCOPLASMA PNEUMONIAE-RVPPCR: NOT DETECTED
Metapneumovirus: NOT DETECTED
PARAINFLUENZA VIRUS 1-RVPPCR: NOT DETECTED
Parainfluenza Virus 2: NOT DETECTED
Parainfluenza Virus 3: NOT DETECTED
Parainfluenza Virus 4: NOT DETECTED
Respiratory Syncytial Virus: NOT DETECTED
Rhinovirus / Enterovirus: NOT DETECTED

## 2017-09-04 LAB — URINALYSIS, ROUTINE W REFLEX MICROSCOPIC
Bilirubin Urine: NEGATIVE
Glucose, UA: NEGATIVE mg/dL
Hgb urine dipstick: NEGATIVE
Ketones, ur: NEGATIVE mg/dL
Leukocytes, UA: NEGATIVE
NITRITE: NEGATIVE
PROTEIN: NEGATIVE mg/dL
SPECIFIC GRAVITY, URINE: 1.011 (ref 1.005–1.030)
pH: 7 (ref 5.0–8.0)

## 2017-09-04 LAB — TROPONIN I

## 2017-09-04 LAB — TSH: TSH: 2.545 u[IU]/mL (ref 0.350–4.500)

## 2017-09-04 LAB — BRAIN NATRIURETIC PEPTIDE: B NATRIURETIC PEPTIDE 5: 285.3 pg/mL — AB (ref 0.0–100.0)

## 2017-09-04 LAB — ECHOCARDIOGRAM COMPLETE: HEIGHTINCHES: 60 in

## 2017-09-04 LAB — PROCALCITONIN

## 2017-09-04 MED ORDER — COLLAGENASE 250 UNIT/GM EX OINT
TOPICAL_OINTMENT | Freq: Every day | CUTANEOUS | Status: DC
  Administered 2017-09-05 – 2017-09-09 (×5): via TOPICAL
  Filled 2017-09-04: qty 30

## 2017-09-04 MED ORDER — WARFARIN - PHARMACIST DOSING INPATIENT: Freq: Every day | Status: DC

## 2017-09-04 MED ORDER — IOPAMIDOL (ISOVUE-370) INJECTION 76%
INTRAVENOUS | Status: AC
  Administered 2017-09-04: 100 mL
  Filled 2017-09-04: qty 100

## 2017-09-04 MED ORDER — TRAMADOL HCL 50 MG PO TABS: 50.0000 mg | ORAL_TABLET | Freq: Four times a day (QID) | ORAL | Status: DC | PRN

## 2017-09-04 MED ORDER — DILTIAZEM HCL-DEXTROSE 100-5 MG/100ML-% IV SOLN (PREMIX)
5.0000 mg/h | INTRAVENOUS | Status: DC
  Administered 2017-09-04: 7.5 mg/h via INTRAVENOUS

## 2017-09-04 MED ORDER — SIMVASTATIN 40 MG PO TABS
40.0000 mg | ORAL_TABLET | Freq: Every day | ORAL | Status: DC
  Administered 2017-09-04 – 2017-09-08 (×5): 40 mg via ORAL
  Filled 2017-09-04 (×6): qty 1

## 2017-09-04 MED ORDER — WARFARIN SODIUM 2 MG PO TABS
3.0000 mg | ORAL_TABLET | Freq: Once | ORAL | Status: AC
  Administered 2017-09-04: 3 mg via ORAL
  Filled 2017-09-04 (×2): qty 1

## 2017-09-04 MED ORDER — CALCIUM CARBONATE-VITAMIN D 500-200 MG-UNIT PO TABS
1.0000 | ORAL_TABLET | Freq: Two times a day (BID) | ORAL | Status: DC
  Administered 2017-09-05 – 2017-09-09 (×9): 1 via ORAL
  Filled 2017-09-04 (×11): qty 1

## 2017-09-04 MED ORDER — DILTIAZEM HCL-DEXTROSE 100-5 MG/100ML-% IV SOLN (PREMIX)
5.0000 mg/h | Freq: Once | INTRAVENOUS | Status: AC
  Administered 2017-09-04: 5 mg/h via INTRAVENOUS
  Filled 2017-09-04: qty 100

## 2017-09-04 MED ORDER — ACETAMINOPHEN 325 MG PO TABS: 650.0000 mg | ORAL_TABLET | ORAL | Status: DC | PRN

## 2017-09-04 MED ORDER — LEVOTHYROXINE SODIUM 137 MCG PO TABS
137.0000 ug | ORAL_TABLET | Freq: Every day | ORAL | Status: DC
  Administered 2017-09-05 – 2017-09-09 (×5): 137 ug via ORAL
  Filled 2017-09-04 (×6): qty 1

## 2017-09-04 MED ORDER — ESCITALOPRAM OXALATE 10 MG PO TABS
5.0000 mg | ORAL_TABLET | Freq: Every day | ORAL | Status: DC
  Administered 2017-09-04 – 2017-09-06 (×3): 5 mg via ORAL
  Filled 2017-09-04 (×3): qty 1

## 2017-09-04 MED ORDER — FUROSEMIDE 10 MG/ML IJ SOLN: 40.0000 mg | Freq: Two times a day (BID) | INTRAMUSCULAR | Status: DC

## 2017-09-04 MED ORDER — ONDANSETRON HCL 4 MG/2ML IJ SOLN: 4.0000 mg | Freq: Four times a day (QID) | INTRAMUSCULAR | Status: DC | PRN

## 2017-09-04 MED ORDER — FUROSEMIDE 10 MG/ML IJ SOLN
40.0000 mg | Freq: Once | INTRAMUSCULAR | Status: AC
  Administered 2017-09-04: 40 mg via INTRAVENOUS
  Filled 2017-09-04: qty 4

## 2017-09-04 MED ORDER — METHOCARBAMOL 1000 MG/10ML IJ SOLN
500.0000 mg | Freq: Four times a day (QID) | INTRAVENOUS | Status: DC | PRN
  Filled 2017-09-04: qty 5

## 2017-09-04 NOTE — Progress Notes (Signed)
  Echocardiogram 2D Echocardiogram has been performed.  Wendy Flowers 09/04/2017, 11:38 AM

## 2017-09-04 NOTE — ED Notes (Signed)
Patient placed on 2L [place] for room air SpO2 83% at rest. Patient denies shortness of breath. SpO2 on 2L O2 is 93%.

## 2017-09-04 NOTE — ED Triage Notes (Signed)
Pt. From home via. EMS after a fall. Pt. Accidentally pressed her fall alarm necklace and police responded. Deputy witnessed pt. Fall. Pt. Reports hitting head on left side on the door. Pt. Also reports lower back pain. No LOC. Pt. Has hx of dementia and is on blood thinner. A/0 X4. NAD at this time.

## 2017-09-04 NOTE — ED Provider Notes (Signed)
TIME SEEN: 1:20 AM  CHIEF COMPLAINT: Head injury  HPI: Patient is an 84 year old female with history of atrial fibrillation on Coumadin, pulmonary hypertension not on oxygen who presents to the emergency department after she excellently pressed her life alert.  Police arrived at her home and they state that she fell striking her head on the doorway.  There is no loss of consciousness.  Brought her here for further evaluation given she is on Coumadin.  Did initially have some lower back pain on the left side but she states this has now resolved.  She denies any fevers, cough.  No chest pain or shortness of breath.  Has bilateral lower extremity edema which she thinks is chronic.  She lives at home alone.  She does not wear oxygen normally.  No vomiting or diarrhea.  ROS: See HPI Constitutional: no fever  Eyes: no drainage  ENT: no runny nose   Cardiovascular:  no chest pain  Resp: no SOB  GI: no vomiting GU: no dysuria Integumentary: no rash  Allergy: no hives  Musculoskeletal: no leg swelling  Neurological: no slurred speech ROS otherwise negative  PAST MEDICAL HISTORY/PAST SURGICAL HISTORY:  Past Medical History:  Diagnosis Date  . Lower extremity edema   . Permanent atrial fibrillation ([place])   . Pulmonary hypertension ([facility])     MEDICATIONS:  Prior to Admission medications   Medication Sig Start Date End Date Taking? Authorizing Provider  acetaminophen (TYLENOL) 325 MG tablet Take 650 mg by mouth every 6 (six) hours as needed for moderate pain.     [provider]  Calcium Carb-Cholecalciferol (CALCIUM + D3) 600-200 MG-UNIT TABS Take 1 tablet by mouth 2 (two) times daily.    [provider]  escitalopram (LEXAPRO) 10 MG tablet TK 0.5 tablet (5 mg) T PO QD 08/07/17   [provider]  furosemide (LASIX) 40 MG tablet Take 40 mg by mouth daily.     [provider]  levothyroxine (SYNTHROID, LEVOTHROID) 137 MCG tablet Take 137 mcg by mouth daily  before breakfast.    [provider]  metoprolol succinate (TOPROL-XL) 50 MG 24 hr tablet Take 100 mg by mouth every evening.  03/28/17   [provider]  PROLIA 60 MG/ML SOLN injection Inject 60 mg into the skin every 6 (six) months. 11/14/17   [provider]  simvastatin (ZOCOR) 40 MG tablet TK 1 T PO QD 07/10/17   [provider]  SYNTHROID 137 MCG tablet Take 137 mcg by mouth at bedtime.  03/27/17   [provider]  warfarin (COUMADIN) 2 MG tablet TK 1 T PO QD OR UTD 06/10/17   [provider]    ALLERGIES:  Allergies  Allergen Reactions  . Foradil [Formoterol] Other (See Comments)    Reaction not recalled (??)    SOCIAL HISTORY:  Social History   Tobacco Use  . Smoking status: Never Smoker  . Smokeless tobacco: Never Used  Substance Use Topics  . Alcohol use: Yes    Comment: seldom; 03/17/2013-occasionally drinks a glass of wine before bed    FAMILY HISTORY: Family History  Problem Relation Age of Onset  . Heart attack Mother   . Heart attack Father   . Diabetes Brother   . Cancer Brother     EXAM: BP 95/71 Comment: Simultaneous filing. User may not have seen previous data.  Pulse (!) 34   Temp 98.3 F (36.8 C) (Oral)   Resp (!) 30   Ht 5' (1.524  m)   SpO2 (!) 88%   BMI 23.63 kg/m  CONSTITUTIONAL: Alert and oriented x3 and responds appropriately to most questions.  Elderly.  In no distress.  GCS 15 HEAD: Normocephalic; small bruise noted to the left cheek but no bony tenderness EYES: Conjunctivae clear, PERRL, EOMI ENT: normal nose; no rhinorrhea; moist mucous membranes; pharynx without lesions noted; no dental injury; no septal hematoma NECK: Supple, no meningismus, no LAD; no midline spinal tenderness, step-off or deformity; trachea midline CARD: Irregularly irregular and tachycardic; S1 and S2 appreciated; no murmurs, no clicks, no rubs, no gallops RESP: Patient is tachypneic and hypoxic; breath sounds clear  and equal bilaterally; no wheezes, no rhonchi, no rales; no respiratory distress CHEST:  chest wall stable, no crepitus or ecchymosis or deformity, nontender to palpation; no flail chest ABD/GI: Normal bowel sounds; non-distended; soft, non-tender, no rebound, no guarding; no ecchymosis or other lesions noted PELVIS:  stable, nontender to palpation BACK:  The back appears normal and is non-tender to palpation, there is no CVA tenderness; no midline spinal tenderness, step-off or deformity EXT: Normal ROM in all joints; non-tender to palpation; equal nonpitting edema in bilateral lower extremities from mid calf to the feet; normal capillary refill; no cyanosis, no bony tenderness or bony deformity of patient's extremities, no joint effusion, compartments are soft, extremities are warm and well-perfused, no ecchymosis SKIN: Normal color for age and race; warm NEURO: Moves all extremities equally diffusely, cranial nerves II through XII intact, normal speech PSYCH: The patient's mood and manner are appropriate. Grooming and personal hygiene are appropriate.  MEDICAL DECISION MAKING: Patient here with A. fib with RVR.  Also has new oxygen requirement.  Could be secondary to pulmonary hypertension, CHF, pneumonia, PE.  Will obtain cardiac labs, chest x-ray.  We will start her on diltiazem.  Feel she will need admission.  No respiratory distress.  Lungs are currently clear.  ED PROGRESS: Patient heart rate improving on diltiazem.  CT of her chest shows bilateral pleural effusion and airspace infiltrates that likely represent edema.  BNP is 283.  Troponin is negative.  Doubt that this is pneumonia.  She has no leukocytosis, fever or productive cough.  She also has multiple thoracic compression fracture seen with progression of a compression fracture at T3 since her previous study at the end of December 2018.  Suspect that this is subacute.  She has no thoracic spinal tenderness on exam.  No focal neurologic  deficits.     6:00 AM Discussed patient's case with hospitalist, Dr. Alcario Drought.  I have recommended admission and patient (and family if present) agree with this plan. Admitting physician will place admission orders.   I reviewed all nursing notes, vitals, pertinent previous records, EKGs, lab and urine results, imaging (as available).       EKG Interpretation  Date/Time:  Thursday September 04 2017 00:59:24 EST Ventricular Rate:  129 PR Interval:    QRS Duration: 97 QT Interval:  327 QTC Calculation: 481 R Axis:   102 Text Interpretation:  A fib with RVR Probable left atrial enlargement Right axis deviation Repolarization abnormality, prob rate related Baseline wander in lead(s) V4 No significant change since last tracing Confirmed by Pryor Curia [phone]) on 09/04/2017 2:03:13 AM        CRITICAL CARE Performed by: Cyril Mourning Ward   Total critical care time: 45 minutes  Critical care time was exclusive of separately billable procedures and treating other patients.  Critical care was necessary to treat or prevent imminent  or life-threatening deterioration.  Critical care was time spent personally by me on the following activities: development of treatment plan with patient and/or surrogate as well as nursing, discussions with consultants, evaluation of patient's response to treatment, examination of patient, obtaining history from patient or surrogate, ordering and performing treatments and interventions, ordering and review of laboratory studies, ordering and review of radiographic studies, pulse oximetry and re-evaluation of patient's condition.      Ward, Delice Bison, DO 09/04/17 [phone]

## 2017-09-04 NOTE — Progress Notes (Signed)
Patient d/w Dr. Estanislado Pandy.  Given the amount of compression, bony retropulsion, and height (t3) of the fracture, this patient is not a candidate for vertebral augmentation.   Will defer further treatment to primary team.  Henreitta Cea 7:37 AM 09/04/2017

## 2017-09-04 NOTE — Progress Notes (Addendum)
RN paged regarding heart rate now better controlled with ventricular rates in the 56D but systolic blood pressure consistently ranging between 86 and 88 with a normal MAP.  Have opted to discontinue Cardizem as well as Lasix in context of hypotension.  If needed can give IV Lopressor prn if heart rate goes back up.  Will monitor for worsening respiratory symptoms/need for diuretics and administer Lasix prn.  Intake and output not documented but based on my observations patient has had about 1800 cc out into the collection canister for external catheter.  Erin Hearing, ANP

## 2017-09-04 NOTE — ED Notes (Signed)
Power of attorney  Wendy Flowers [phone]

## 2017-09-04 NOTE — Consult Note (Addendum)
[facility] Nurse wound consult note Reason for Consult: Consult requested for sacrum and buttocks.  Pt is very thin and fell at home on 1/1. Appearance of pressure injuries are consistent with wounds which have been present for awhile. Discussed this with family member at the bedside, and reviewed the plan of care. Wound type: Sacrum with stage 3 pressure injury; .3X.3X.3cm, 30% yellow, 70% red, small amt tan drainage, some strong odor. (Odor source may be from vaginal area. It is difficult to determine.) Left buttock with patchy area of unstageable pressure injuries; affected area is approx 3.5X.8cm, 80% yellow, 20% red, small amt tan drainage, some strong odor.(Odor source may be from vaginal area. It is difficult to determine.) Pressure Injury POA: Yes Dressing procedure/placement/frequency: Santyl ointment to provide enzymatic debridement of nonviable tissue; foam dressing to protect from further injury. Please re-consult if further assistance is needed.  Thank-you,  Julien Girt MSN, [place], [place], [place], [place]

## 2017-09-04 NOTE — Progress Notes (Signed)
ANTICOAGULATION CONSULT NOTE - Initial Consult  Pharmacy Consult for warfarin  Indication: atrial fibrillation  Allergies  Allergen Reactions  . Foradil [Formoterol] Other (See Comments)    Reaction not recalled (??)    Patient Measurements: Height: 5' (152.4 cm) IBW/kg (Calculated) : 45.5   Vital Signs: Temp: 98.3 F (36.8 C) (01/24 0056) Temp Source: Oral (01/24 0056) BP: 124/67 (01/24 0630) Pulse Rate: 103 (01/24 0630)  Labs: Recent Labs    09/04/17 0102  HGB 14.5  HCT 44.8  PLT 262  LABPROT 21.4*  INR 1.88  CREATININE 1.14*  TROPONINI <0.03    CrCl cannot be calculated (Unknown ideal weight.).   Medical History: Past Medical History:  Diagnosis Date  . Lower extremity edema   . Permanent atrial fibrillation ([place])   . Pulmonary hypertension ([facility])     Medications:  Warfarin 2mg  daily prior to admission. Last dose 1/23.  Assessment: 84 y.o. Female presented to the ED for fall/head injury. CT of head and cervical spine negative for bleeding.  CTA negative for PE.  PMH history includes afib and pulmonary hypertension.    INR slightly subtherapeutic at 1.88. CBC within normal limits.  Goal of Therapy:  INR 2-3 Monitor platelets by anticoagulation protocol: Yes   Plan:  Warfarin 3mg  x 1 Monitor daily INR, CBC, signs/symptoms of bleeding.   Thank you for allowing [place] to participate in this patients care.  Mariella Saa, PharmD 09/04/2017 7:41 AM

## 2017-09-04 NOTE — ED Notes (Signed)
Attending paged regarding vital signs being outside written cardizem parameters. Patient has no complaints at this time.

## 2017-09-04 NOTE — H&P (Signed)
History and Physical    Wendy Flowers WVP:[id number] DOB: 07-19-1927 DOA: 09/04/2017  **Will admit patient based on the expectation that the patient will need hospitalization/ hospital care that crosses at least 2 midnights  PCP: Deland Pretty, MD   Attending physician: Evangeline Gula  Patient coming from/Resides with: Private residence/alone  Chief Complaint: Atrial fibrillation with RVR/systolic heart failure exacerbation with acute kidney injury  HPI: Wendy Flowers is a 84 y.o. female with medical history significant for known pulmonary hypertension diagnosed by right heart cath/Croituro, chronic atrial fibrillation on warfarin, hypertension, hypothyroidism, systolic heart failure with restrictive physiology, osteoporosis with chronic compression fractures and dyslipidemia.  Was brought to the ER via EMS after a fall at home.  It was reported that she accidentally pressed her life alert necklace and deputies responded to the home.  Patient told them she was fine but then fell into the doorway and hit her head on the side of the door.  She was also complaining of some back pain prior to the fall.  Patient was noted to be in atrial fibrillation with RVR with SBPs between 90 and 100.  After arrival to the ER it was noted her pulse oximetry was dropping to 86% on room air therefore nasal cannula oxygen was applied.  CT imaging of head and cervical spine unrevealing for traumatic injury.  Chest x-ray revealed cardiomegaly with small pleural effusions with left basilar atelectasis or pneumonia.  Given her presentation with hypoxemia and lower extremity edema as well as subtherapeutic INR CT angiography of the chest was completed to rule out PE.  No PE was detected but bilateral pleural effusions were confirmed as well as airspace infiltrates likely representing edema due to congestive heart failure.  In addition there was an incidental finding of multiple thoracic compression fractures consistent with  osteoporosis with progression of the T3 fracture since previous study.  Patient's BNP was slightly elevated at 285 and she had a mild acute kidney injury.  She has been given Lasix 40 mg IV with marked increase in urinary output.  ED Course:  Vital Signs: BP 124/67   Pulse (!) 103   Temp 98.3 F (36.8 C) (Oral)   Resp (!) 32   Ht 5' (1.524 m)   SpO2 94%   BMI 23.63 kg/m  Chest x-ray: As above CT angiography of the chest: As above CT head/cervical spine: As above Lab data: Sodium 135, potassium 4.2, chloride 94, CO2 29, glucose 140, BUN 24, creatinine 1.14, anion gap 12, BNP 285, troponin <0.03, white count 9200 differential not obtained, hemoglobin 14.5, platelets 262,000, PT 21.4, INR 1.88 Medications and treatments: Cardizem infusion titrated, Lasix 40 mg IV x1  Review of Systems:  **Patient is an inconsistent historian in context of her dementia.  She denied recent issues regarding fevers, chills or productive cough; she denied sick contacts.  She denied any awareness of tachypalpitations.  She was unclear as to progression in her chronic lower extremity edema.  She denies excessive fatigue with ambulation.  She states she does not think she has been having shortness of breath with walking.  She did state that she was concerned over her living situation and stated she did not feel "secure" but when pressed as to whether she was concerned about living alone she stated replying "I really do not have that many medical problems or issues even now in the 7s"   Past Medical History:  Diagnosis Date  . Accident due to mechanical fall without injury   .  Chronic systolic heart failure ([place])   . Dementia   . HLD (hyperlipidemia)   . HTN (hypertension)   . Hypertensive left ventricular hypertrophy with heart failure ([place])   . Hypothyroidism   . Lower extremity edema   . Osteoporosis   . Permanent atrial fibrillation ([place])   . Pulmonary hypertension ([place])    dx'd by right heart  cath/Croituro  . Thoracic compression fracture [facility])     Past Surgical History:  Procedure Laterality Date  . BREAST LUMPECTOMY  1610   left  . CARDIAC CATHETERIZATION  06/13/2010   R & LHC: no CAD  . CATARACT EXTRACTION  2008   bilateral  . [place]   left  . US ECHOCARDIOGRAPHY  02/04/2012   LA mod. dilated,RA mild-mod dilated,mild MR,mod. TR    Social History   Socioeconomic History  . Marital status: Widowed    Spouse name: Not on file  . Number of children: Not on file  . Years of education: Not on file  . Highest education level: Not on file  Social Needs  . Financial resource strain: Not on file  . Food insecurity - worry: Not on file  . Food insecurity - inability: Not on file  . Transportation needs - medical: Not on file  . Transportation needs - non-medical: Not on file  Occupational History  . Not on file  Tobacco Use  . Smoking status: Never Smoker  . Smokeless tobacco: Never Used  Substance and Sexual Activity  . Alcohol use: Yes    Comment: seldom; 03/17/2013-occasionally drinks a glass of wine before bed  . Drug use: No  . Sexual activity: Not on file  Other Topics Concern  . Not on file  Social History Narrative  . 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 Foradil [Formoterol] Other (See Comments)    Reaction not recalled (??)    Family History  Problem Relation Age of Onset  . Heart attack Mother   . Heart attack Father   . Diabetes Brother   . Cancer Brother      Prior to Admission medications   Medication Sig Start Date End Date Taking? Authorizing Provider  acetaminophen (TYLENOL) 325 MG tablet Take 650 mg by mouth every 6 (six) hours as needed for moderate pain.     [provider]  Calcium Carb-Cholecalciferol (CALCIUM + D3) 600-200 MG-UNIT TABS Take 1  tablet by mouth 2 (two) times daily.    [provider]  escitalopram (LEXAPRO) 10 MG tablet TK 0.5 tablet (5 mg) T PO QD 08/07/17   [provider]  furosemide (LASIX) 40 MG tablet Take 40 mg by mouth daily.     [provider]  levothyroxine (SYNTHROID, LEVOTHROID) 137 MCG tablet Take 137 mcg by mouth daily before breakfast.    [provider]  metoprolol succinate (TOPROL-XL) 50 MG 24 hr tablet Take 100 mg by mouth every evening.  03/28/17   [provider]  PROLIA 60 MG/ML SOLN injection Inject 60 mg into the skin every 6 (six) months. 11/14/17   [provider]  simvastatin (ZOCOR) 40 MG tablet TK 1 T PO QD 07/10/17   [provider]  SYNTHROID 137 MCG tablet Take 137 mcg by mouth at bedtime.  03/27/17   [provider]  warfarin (COUMADIN)  2 MG tablet TK 1 T PO QD OR UTD 06/10/17   [provider]    Physical Exam: Vitals:   09/04/17 0500 09/04/17 0530 09/04/17 0600 09/04/17 0630  BP: 112/90 122/74 112/70 124/67  Pulse: 89 (!) 115 (!) 106 (!) 103  Resp: (!) 22 (!) 23 (!) 22 (!) 32  Temp:      TempSrc:      SpO2: 94% 97% 100% 94%  Height:          Constitutional: NAD, calm, comfortable Eyes: PERRL, lids and conjunctivae normal ENMT: Mucous membranes are moist. Posterior pharynx clear of any exudate or lesions.age-appropriate dentition.  Neck: normal, supple, no masses, no thyromegaly Respiratory: Diminished bilateral bases with scattered crackles, normal respiratory effort without accessory muscle use at rest.  2 L Cardiovascular: Irregular rhythm/atrial fibrillation, regular response less than 90 bpm on Cardizem infusion no murmurs / rubs / gallops.  Marked soft bilateral lower extremity edema. 2+ pedal pulses. No carotid bruits.  Abdomen: no tenderness, no masses palpated. No hepatosplenomegaly. Bowel sounds positive.  Genitourinary: Female external catheter to wall suction with greater than 1 L noted  in container Musculoskeletal: no clubbing / cyanosis. No joint deformity upper and lower extremities. Good ROM, no contractures. Normal muscle tone.  Skin: no rashes, lesions, ulcers. No induration Neurologic: CN 2-12 grossly intact. Sensation intact, DTR normal. Strength 4+-5/5 x all 4 extremities.  Psychiatric: Alert and oriented x name only. Normal mood although somewhat flat affect.    Labs on Admission: I have personally reviewed following labs and imaging studies  CBC: Recent Labs  Lab 09/04/17 0102  WBC 9.2  HGB 14.5  HCT 44.8  MCV 87.2  PLT 025   Basic Metabolic Panel: Recent Labs  Lab 09/04/17 0102  NA 135  K 4.2  CL 94*  CO2 29  GLUCOSE 140*  BUN 24*  CREATININE 1.14*  CALCIUM 9.8   GFR: CrCl cannot be calculated (Unknown ideal weight.). Liver Function Tests: No results for input(s): AST, ALT, ALKPHOS, BILITOT, PROT, ALBUMIN in the last 168 hours. No results for input(s): LIPASE, AMYLASE in the last 168 hours. No results for input(s): AMMONIA in the last 168 hours. Coagulation Profile: Recent Labs  Lab 09/04/17 0102  INR 1.88   Cardiac Enzymes: Recent Labs  Lab 09/04/17 0102  TROPONINI <0.03   BNP (last 3 results) No results for input(s): PROBNP in the last 8760 hours. HbA1C: No results for input(s): HGBA1C in the last 72 hours. CBG: No results for input(s): GLUCAP in the last 168 hours. Lipid Profile: No results for input(s): CHOL, HDL, LDLCALC, TRIG, CHOLHDL, LDLDIRECT in the last 72 hours. Thyroid Function Tests: No results for input(s): TSH, T4TOTAL, FREET4, T3FREE, THYROIDAB in the last 72 hours. Anemia Panel: No results for input(s): VITAMINB12, FOLATE, FERRITIN, TIBC, IRON, RETICCTPCT in the last 72 hours. Urine analysis:    Component Value Date/Time   COLORURINE YELLOW 05/13/2017 [place] 05/13/2017 1754   LABSPEC 1.009 05/13/2017 1754   PHURINE 6.0 05/13/2017 1754   GLUCOSEU NEGATIVE 05/13/2017 1754   HGBUR  NEGATIVE 05/13/2017 1754   BILIRUBINUR NEGATIVE 05/13/2017 1754   KETONESUR NEGATIVE 05/13/2017 1754   PROTEINUR NEGATIVE 05/13/2017 1754   UROBILINOGEN 1.0 02/05/2014 1258   NITRITE NEGATIVE 05/13/2017 1754   LEUKOCYTESUR SMALL (A) 05/13/2017 1754   Sepsis Labs: @LABRCNTIP (procalcitonin:4,lacticidven:4) )No results found for this or any previous visit (from the past 240 hour(s)).   Radiological Exams on Admission: Dg Chest 2 View  Result Date: 09/04/2017 CLINICAL DATA:  Atrial fibrillation and shortness of breath EXAM: CHEST  2 VIEW COMPARISON:  08/11/2017 CT and chest x-ray, chest x-ray 02/05/2014 FINDINGS: Cardiomegaly with small left greater than right pleural effusions. Left basilar atelectasis or pneumonia. Aortic atherosclerosis. Multiple compression deformities of the spine. IMPRESSION: Cardiomegaly with small left greater than right pleural effusions and left basilar atelectasis or pneumonia. Electronically Signed   By: Donavan Foil M.D.   On: 09/04/2017 01:30   Ct Head Wo Contrast  Result Date: 09/04/2017 CLINICAL DATA:  Initial evaluation for acute trauma, fall. EXAM: CT HEAD WITHOUT CONTRAST CT CERVICAL SPINE WITHOUT CONTRAST TECHNIQUE: Multidetector CT imaging of the head and cervical spine was performed following the standard protocol without intravenous contrast. Multiplanar CT image reconstructions of the cervical spine were also generated. COMPARISON:  Prior CT from 08/11/2017. FINDINGS: CT HEAD FINDINGS Brain: Age-related cerebral volume loss with moderate chronic small vessel ischemic disease no acute intracranial hemorrhage. No acute large vessel territory infarct. No mass lesion, midline shift or mass effect. No hydrocephalus. No extra-axial fluid collection. Vascular: No hyperdense vessel. Scattered vascular calcifications noted within the carotid siphons. Skull: Scalp soft tissues and calvarium within normal limits. Sinuses/Orbits: Globes and orbital soft tissues within  normal limits. Paranasal sinuses and mastoid air cells are clear. Other: None. CT CERVICAL SPINE FINDINGS Alignment: Exaggeration of the normal cervical lordosis. Trace anterolisthesis of T2 on T3, stable. Skull base and vertebrae: Skull base intact. Normal C1-2 articulations are preserved in the dens is intact. There is progressive height loss with sclerosis at the superior endplate of T3 as compared to previous, consistent with acute to subacute compression fracture. Height loss of approximate 75% with 3 mm bony retropulsion. The vertebral body heights otherwise maintained. No other acute fracture. Soft tissues and spinal canal: Soft tissues of the neck demonstrate no acute abnormality. No prevertebral edema. Spinal canal within normal limits. Disc levels: Mild degenerative disc bulging noted at C3-4 and C4-5 without significant stenosis. Left-sided facet arthropathy noted at C4-5 and C5-6. Upper chest: Visualized upper chest within normal limits. Partially visualized lung apices are clear. Other: None. IMPRESSION: CT BRAIN: 1. No acute intracranial process. 2. Moderate generalized age-related cerebral atrophy with moderate chronic small vessel ischemic disease. CT CERVICAL SPINE: 1. No acute cervical spine injury identified. 2. Acute to subacute compression fracture involving the T3 vertebral body with up to 70% height loss and 3 mm bony retropulsion. There was a mild compression deformity at this level on prior study from 08/11/2017, suggesting that this may be subacute in nature. Correlation with physical exam recommended. Electronically Signed   By: Jeannine Boga M.D.   On: 09/04/2017 04:55   Ct Angio Chest Pe W And/or Wo Contrast  Result Date: 09/04/2017 CLINICAL DATA:  Back pain and shortness of breath. Fall, striking head on the floor. No loss of consciousness. History of dementia. Low oxygen saturation. EXAM: CT ANGIOGRAPHY CHEST WITH CONTRAST TECHNIQUE: Multidetector CT imaging of the chest was  performed using the standard protocol during bolus administration of intravenous contrast. Multiplanar CT image reconstructions and MIPs were obtained to evaluate the vascular anatomy. CONTRAST:  134mL ISOVUE-370 IOPAMIDOL (ISOVUE-370) INJECTION 76% COMPARISON:  None. FINDINGS: Cardiovascular: Good opacification of the central and segmental pulmonary arteries. No focal filling defects. No evidence of significant pulmonary embolus. Diffuse cardiac enlargement with particular dilatation of the right heart and left atrium. No pericardial effusion. Normal caliber thoracic aorta with scattered calcifications. Coronary artery calcifications. Mediastinum/Nodes: Esophagus is decompressed. No  significant lymphadenopathy in the chest. Lungs/Pleura: Small bilateral pleural effusions with basilar atelectasis. Emphysematous changes in the lungs. Patchy areas of airspace disease bilaterally likely representing edema. Multifocal pneumonia is another possibility. No pneumothorax. Airways are patent. Upper Abdomen: Multiple low-attenuation lesions in the liver likely representing cysts. Musculoskeletal: Degenerative changes throughout the thoracic spine. Multiple thoracic vertebral compression deformities with ballooning of interspaces, likely indicating osteoporosis. Similar findings were demonstrated on previous CT thoracic spine from 08/11/2017. There is progression of compression at T3 since the previous study. Review of the MIP images confirms the above findings. IMPRESSION: 1. No evidence of significant pulmonary embolus. 2. Diffuse cardiac enlargement. 3. Bilateral pleural effusions and airspace infiltrates likely representing edema due to congestive failure. Pneumonia could also have this appearance. 4. Multiple thoracic compression fractures likely indicating osteoporosis. Progression of compression at T3 since the previous study. 5. Multiple hepatic cysts. Aortic Atherosclerosis (ICD10-I70.0). Electronically Signed   By:  Lucienne Capers M.D.   On: 09/04/2017 04:37   Ct Cervical Spine Wo Contrast  Result Date: 09/04/2017 CLINICAL DATA:  Initial evaluation for acute trauma, fall. EXAM: CT HEAD WITHOUT CONTRAST CT CERVICAL SPINE WITHOUT CONTRAST TECHNIQUE: Multidetector CT imaging of the head and cervical spine was performed following the standard protocol without intravenous contrast. Multiplanar CT image reconstructions of the cervical spine were also generated. COMPARISON:  Prior CT from 08/11/2017. FINDINGS: CT HEAD FINDINGS Brain: Age-related cerebral volume loss with moderate chronic small vessel ischemic disease no acute intracranial hemorrhage. No acute large vessel territory infarct. No mass lesion, midline shift or mass effect. No hydrocephalus. No extra-axial fluid collection. Vascular: No hyperdense vessel. Scattered vascular calcifications noted within the carotid siphons. Skull: Scalp soft tissues and calvarium within normal limits. Sinuses/Orbits: Globes and orbital soft tissues within normal limits. Paranasal sinuses and mastoid air cells are clear. Other: None. CT CERVICAL SPINE FINDINGS Alignment: Exaggeration of the normal cervical lordosis. Trace anterolisthesis of T2 on T3, stable. Skull base and vertebrae: Skull base intact. Normal C1-2 articulations are preserved in the dens is intact. There is progressive height loss with sclerosis at the superior endplate of T3 as compared to previous, consistent with acute to subacute compression fracture. Height loss of approximate 75% with 3 mm bony retropulsion. The vertebral body heights otherwise maintained. No other acute fracture. Soft tissues and spinal canal: Soft tissues of the neck demonstrate no acute abnormality. No prevertebral edema. Spinal canal within normal limits. Disc levels: Mild degenerative disc bulging noted at C3-4 and C4-5 without significant stenosis. Left-sided facet arthropathy noted at C4-5 and C5-6. Upper chest: Visualized upper chest within  normal limits. Partially visualized lung apices are clear. Other: None. IMPRESSION: CT BRAIN: 1. No acute intracranial process. 2. Moderate generalized age-related cerebral atrophy with moderate chronic small vessel ischemic disease. CT CERVICAL SPINE: 1. No acute cervical spine injury identified. 2. Acute to subacute compression fracture involving the T3 vertebral body with up to 70% height loss and 3 mm bony retropulsion. There was a mild compression deformity at this level on prior study from 08/11/2017, suggesting that this may be subacute in nature. Correlation with physical exam recommended. Electronically Signed   By: Jeannine Boga M.D.   On: 09/04/2017 04:55    EKG: (Independently reviewed) atrial fibrillation with ventricular response 129 bpm, QTC 481 ms, right axis deviation with?  Incomplete right bundle branch block, positive LVH criteria, no definitive acute ischemic changes in essentially unchanged from previous EKGs  Assessment/Plan Principal Problem:   Atrial fibrillation with RVR  -  Patient presents to ER after witnessed mechanical fall -subsequently found to be hypoxemic and experiencing atrial fibrillation with RVR -Current ventricular rate controlled on Cardizem infusion-continue for now given suboptimal blood pressure readings (easier to titrate as opposed to longer acting oral preparations in the context of lower BP) -Hold home Lopressor -Initial INR subtherapeutic-continue warfarin with pharmacy dosing-see below regarding mechanical fall -CHA2DS2-VASc=5 with an unadjusted stroke rate of 7.2 % per year if not on anticoagulation -Patient was also seen in the ER 12/31 for A. fib with RVR but was deemed appropriate for discharge home  Active Problems:   Acute respiratory failure with hypoxia 2/2 Acute on chronic systolic heart failure/known pulmonary hypertension with associated tricuspid regurgitation -Patient presents as described above and was found to be hypoxemic with  abnormal chest x-ray and CT scan concerning for edema and bilateral pleural effusions -BNP modestly elevated with an associated mild acute kidney injury likely related to hypoperfusion in context of acute heart failure as well as atrial fibrillation with RVR -Blood pressures suboptimal so holding home beta-blocker; was not on ARB/ACE I prior to admission -Not on diuretics prior to admission; for now we will give Lasix 40 mg IV every 12 hours noting patient has marked bilateral lower extremity edema -Daily weights/strict I and O -Last echocardiogram 2015: Moderate LVH with EF 45-50% and associated diffuse hypokinesis with mild mitral regurg, severe biatrial dilatation, moderate tricuspid regurgitation with cardiologist noting findings consistent with restrictive physiology -Echocardiogram this admission -Continue supportive care with oxygen -For completeness of exam check respiratory viral panel and lower respiratory tract Procalcitonin to rule out potential concomitant infectious etiology    Fall at home -Reason for previous hospitalization October 2018 and required short-term SNF placement for rehab -Patient has dementia and lives alone.  She expressed to me concerns over her "current living situation" and not feeling "secure"-patient may need to discharge to ALF versus SNF -PT/OT evaluation    Thoracic compression fracture  -Radiologist uncertain if acute versus subacute -I asked IR to evaluate to determine if appropriate for intervention; due to the degree of compression, bony retropulsion and height of the T3 fracture the patient was deemed to not be a candidate for vertebral augmentation -Focus now will be on pain management and therapy -Robaxin 500 mg IV every 6 hours as needed -Tylenol for mild pain -Ultram for moderate -severe pain    Acute kidney injury  -Baseline renal function December 2018: 18/0.79 -Current renal function: 24/1.14 -Suspect related to hypoperfusion in context of  RVR as well as acute systolic heart failure -Follow labs with diuresis    Dementia -Not on disease modifying medications prior to admission -See above regarding possibility patient needs to discharge to ALF versus other facility    Hypothyroidism, adult -Continue Synthroid -Check TSH especially in the context of presentation with RVR    Dyslipidemia -Continue statin      DVT prophylaxis: Warfarin Code Status: Full code-this based on previous admission documentation Family Communication: M.  Burton/nephew and POA at bedside Disposition Plan: TBD Consults called: None    ELLIS,ALLISON L. ANP-BC [facility] Pager [phone]   If 7PM-7AM, please contact night-coverage [email or web address] Password [email or web address]  09/04/2017, 8:01 AM

## 2017-09-04 NOTE — ED Notes (Signed)
Pt stated desating to 86% on RA. Put on 2L [place]

## 2017-09-05 DIAGNOSIS — I959 Hypotension, unspecified: Secondary | ICD-10-CM

## 2017-09-05 DIAGNOSIS — I4891 Unspecified atrial fibrillation: Secondary | ICD-10-CM

## 2017-09-05 DIAGNOSIS — L8990 Pressure ulcer of unspecified site, unspecified stage: Secondary | ICD-10-CM

## 2017-09-05 DIAGNOSIS — I5023 Acute on chronic systolic (congestive) heart failure: Secondary | ICD-10-CM

## 2017-09-05 LAB — PROTIME-INR
INR: 2.8
Prothrombin Time: 29.3 seconds — ABNORMAL HIGH (ref 11.4–15.2)

## 2017-09-05 LAB — LIPID PANEL
CHOL/HDL RATIO: 2.9 ratio
CHOLESTEROL: 113 mg/dL (ref 0–200)
HDL: 39 mg/dL — ABNORMAL LOW (ref >40)
LDL Cholesterol: 64 mg/dL (ref 0–99)
Triglycerides: 49 mg/dL (ref <150)
VLDL: 10 mg/dL (ref 0–40)

## 2017-09-05 LAB — URINE CULTURE: CULTURE: NO GROWTH

## 2017-09-05 LAB — BASIC METABOLIC PANEL
Anion gap: 10 (ref 5–15)
BUN: 15 mg/dL (ref 6–20)
CO2: 30 mmol/L (ref 22–32)
Calcium: 8.2 mg/dL — ABNORMAL LOW (ref 8.9–10.3)
Chloride: 98 mmol/L — ABNORMAL LOW (ref 101–111)
Creatinine, Ser: 0.78 mg/dL (ref 0.44–1.00)
GFR calc Af Amer: >60 mL/min (ref >60)
GLUCOSE: 100 mg/dL — AB (ref 65–99)
Potassium: 3.1 mmol/L — ABNORMAL LOW (ref 3.5–5.1)
SODIUM: 138 mmol/L (ref 135–145)

## 2017-09-05 LAB — CBC
HEMATOCRIT: 38.8 % (ref 36.0–46.0)
Hemoglobin: 12.2 g/dL (ref 12.0–15.0)
MCH: 28 pg (ref 26.0–34.0)
MCHC: 31.4 g/dL (ref 30.0–36.0)
MCV: 89 fL (ref 78.0–100.0)
PLATELETS: 201 10*3/uL (ref 150–400)
RBC: 4.36 MIL/uL (ref 3.87–5.11)
RDW: 15.7 % — AB (ref 11.5–15.5)
WBC: 7.1 10*3/uL (ref 4.0–10.5)

## 2017-09-05 LAB — GLUCOSE, CAPILLARY: GLUCOSE-CAPILLARY: 124 mg/dL — AB (ref 65–99)

## 2017-09-05 MED ORDER — ENSURE ENLIVE PO LIQD
237.0000 mL | Freq: Three times a day (TID) | ORAL | Status: DC
  Administered 2017-09-05 – 2017-09-09 (×11): 237 mL via ORAL

## 2017-09-05 MED ORDER — METOPROLOL TARTRATE 5 MG/5ML IV SOLN
2.5000 mg | Freq: Four times a day (QID) | INTRAVENOUS | Status: DC | PRN
  Administered 2017-09-05: 2.5 mg via INTRAVENOUS
  Filled 2017-09-05: qty 5

## 2017-09-05 MED ORDER — METOPROLOL TARTRATE 12.5 MG HALF TABLET
12.5000 mg | ORAL_TABLET | Freq: Two times a day (BID) | ORAL | Status: DC
  Administered 2017-09-05 – 2017-09-06 (×2): 12.5 mg via ORAL
  Filled 2017-09-05 (×2): qty 1

## 2017-09-05 MED ORDER — ADULT MULTIVITAMIN W/MINERALS CH
1.0000 | ORAL_TABLET | Freq: Every day | ORAL | Status: DC
  Administered 2017-09-05 – 2017-09-09 (×5): 1 via ORAL
  Filled 2017-09-05 (×5): qty 1

## 2017-09-05 MED ORDER — POTASSIUM CHLORIDE CRYS ER 20 MEQ PO TBCR
40.0000 meq | EXTENDED_RELEASE_TABLET | Freq: Two times a day (BID) | ORAL | Status: DC
  Administered 2017-09-05 – 2017-09-06 (×3): 40 meq via ORAL
  Filled 2017-09-05 (×4): qty 2

## 2017-09-05 NOTE — Progress Notes (Signed)
PROGRESS NOTE    Wendy Flowers  WUJ:[id number] DOB: [birth year] DOA: 09/04/2017 PCP: Deland Pretty, MD   Chief Complaint  Patient presents with  . Fall    Brief Narrative:  HPI on 09/04/2017 by [name], NP [name] is a 84 y.o. female with medical history significant for known pulmonary hypertension diagnosed by right heart cath/Croituro, chronic atrial fibrillation on warfarin, hypertension, hypothyroidism, systolic heart failure with restrictive physiology, osteoporosis with chronic compression fractures and dyslipidemia.  Was brought to the ER via EMS after a fall at home.  It was reported that she accidentally pressed her life alert necklace and deputies responded to the home.  Patient told them she was fine but then fell into the doorway and hit her head on the side of the door.  She was also complaining of some back pain prior to the fall.  Patient was noted to be in atrial fibrillation with RVR with SBPs between 90 and 100.  After arrival to the ER it was noted her pulse oximetry was dropping to 86% on room air therefore nasal cannula oxygen was applied.  CT imaging of head and cervical spine unrevealing for traumatic injury.  Chest x-ray revealed cardiomegaly with small pleural effusions with left basilar atelectasis or pneumonia.  Given her presentation with hypoxemia and lower extremity edema as well as subtherapeutic INR CT angiography of the chest was completed to rule out PE.  No PE was detected but bilateral pleural effusions were confirmed as well as airspace infiltrates likely representing edema due to congestive heart failure.  In addition there was an incidental finding of multiple thoracic compression fractures consistent with osteoporosis with progression of the T3 fracture since previous study.  Patient's BNP was slightly elevated at 285 and she had a mild acute kidney injury.  She has been given Lasix 40 mg IV with marked increase in urinary output.  Assessment & Plan    Atrial fibrillation with RVR -CHADSVASC 5 -Continue Coumadin per pharmacy -cardiology consulted and appreciated, recommended lopressor   Acute respiratory failure with hypoxia -Likely multifactorial including systolic heart failure, pulmonary hypertension -Continue to treat heart failure and monitor -Continue supplemental oxygen to maintain saturations above 92%  Acute on chronic systolic heart failure  -BNP 285.3 -presented with lower extremity edema -Echocardiogram in 2015: EF 45-50% -cardiology not recommending diuresis at this time  Acute kidney injury -Baseline creatinine approximate 0.7  Hypotension -possibly secondary to AF/beta blocker, diuresis  -continue to monitor   Fall -PT/OT consulted  Thoracic compression fracture -Unknown at this is acute versus subacute -Interventional radiology consulted and appreciated, patient deemed not to be candidate for vertebral augmentation -Continue pain control -As above, PT OT consulted  Dementia -Not on medications prior to admission -Discussed with patient's nephew at bedside who is also her power of attorney -likely should not be living at home alone  Hypothyroidism -Continue Synthroid -TSH 2.545  Dyslipidemia -Continue statin  Goals of care -discussed with nephew at bedside, he would like to discuss her code status with the rest of the family  DVT Prophylaxis  Coumadin  Code Status: Full  Family Communication: Nephew at bedside  Disposition Plan: Admitted  Consultants Cardiology Interventional radiology  Procedures  None  Antibiotics   Anti-infectives (From admission, onward)   None      Subjective:   Wendy Flowers seen and examined today.  Dementia. No complaints.    Objective:   Vitals:   09/05/17 0414 09/05/17 0748 09/05/17 1200 09/05/17 1432  BP: 99/62 (!) 89/58 102/75 (!) 89/65  Pulse: (!) 125 (!) 129 (!) 123 (!) 149  Resp: (!) 23 (!) 24 (!) 27 (!) 32  Temp: 98 F (36.7 C)  97.7 F  (36.5 C) 98.1 F (36.7 C)  TempSrc: Oral  Axillary Oral  SpO2: 97% 96% 90% 96%  Weight: 50.7 kg (111 lb 12.4 oz)     Height:        Intake/Output Summary (Last 24 hours) at 09/05/2017 1535 Last data filed at 09/05/2017 1300 Gross per 24 hour  Intake 720 ml  Output -  Net 720 ml   Filed Weights   09/05/17 0414  Weight: 50.7 kg (111 lb 12.4 oz)    Exam  General: Well developed, chronically ill appearing, frail   HEENT: NCAT, mucous membranes moist.   Cardiovascular: S1 S2 auscultated, irregularly irregular  Respiratory: Clear to auscultation bilaterally with equal chest rise  Abdomen: Soft, nontender, nondistended, + bowel sounds  Extremities: warm dry without cyanosis clubbing. +LE edema  Neuro: AAOx1, nonfocal, demented   Psych: pleasant   Data Reviewed: I have personally reviewed following labs and imaging studies  CBC: Recent Labs  Lab 09/04/17 0102 09/05/17 0518  WBC 9.2 7.1  HGB 14.5 12.2  HCT 44.8 38.8  MCV 87.2 89.0  PLT 262 161   Basic Metabolic Panel: Recent Labs  Lab 09/04/17 0102 09/05/17 0518  NA 135 138  K 4.2 3.1*  CL 94* 98*  CO2 29 30  GLUCOSE 140* 100*  BUN 24* 15  CREATININE 1.14* 0.78  CALCIUM 9.8 8.2*   GFR: Estimated Creatinine Clearance: 34.9 mL/min (by C-G formula based on SCr of 0.78 mg/dL). Liver Function Tests: No results for input(s): AST, ALT, ALKPHOS, BILITOT, PROT, ALBUMIN in the last 168 hours. No results for input(s): LIPASE, AMYLASE in the last 168 hours. No results for input(s): AMMONIA in the last 168 hours. Coagulation Profile: Recent Labs  Lab 09/04/17 0102 09/05/17 0518  INR 1.88 2.80   Cardiac Enzymes: Recent Labs  Lab 09/04/17 0102  TROPONINI <0.03   BNP (last 3 results) No results for input(s): PROBNP in the last 8760 hours. HbA1C: No results for input(s): HGBA1C in the last 72 hours. CBG: Recent Labs  Lab 09/05/17 1156  GLUCAP 124*   Lipid Profile: Recent Labs    09/05/17 0518    CHOL 113  HDL 39*  LDLCALC 64  TRIG 49  CHOLHDL 2.9   Thyroid Function Tests: Recent Labs    09/04/17 0102  TSH 2.545   Anemia Panel: No results for input(s): VITAMINB12, FOLATE, FERRITIN, TIBC, IRON, RETICCTPCT in the last 72 hours. Urine analysis:    Component Value Date/Time   COLORURINE STRAW (A) 09/04/2017 0759   APPEARANCEUR CLEAR 09/04/2017 0759   LABSPEC 1.011 09/04/2017 0759   PHURINE 7.0 09/04/2017 0759   GLUCOSEU NEGATIVE 09/04/2017 0759   HGBUR NEGATIVE 09/04/2017 0759   BILIRUBINUR NEGATIVE 09/04/2017 0759   KETONESUR NEGATIVE 09/04/2017 0759   PROTEINUR NEGATIVE 09/04/2017 0759   UROBILINOGEN 1.0 02/05/2014 1258   NITRITE NEGATIVE 09/04/2017 0759   LEUKOCYTESUR NEGATIVE 09/04/2017 0759   Sepsis Labs: @LABRCNTIP (procalcitonin:4,lacticidven:4)  ) Recent Results (from the past 240 hour(s))  Culture, Urine     Status: None   Collection Time: 09/04/17  7:59 AM  Result Value Ref Range Status   Specimen Description URINE, CLEAN CATCH  Final   Special Requests NONE  Final   Culture NO GROWTH  Final   Report Status 09/05/2017 FINAL  Final  Respiratory Panel by PCR     Status: None   Collection Time: 09/04/17  8:11 AM  Result Value Ref Range Status   Adenovirus NOT DETECTED NOT DETECTED Final   Coronavirus 229E NOT DETECTED NOT DETECTED Final   Coronavirus HKU1 NOT DETECTED NOT DETECTED Final   Coronavirus NL63 NOT DETECTED NOT DETECTED Final   Coronavirus OC43 NOT DETECTED NOT DETECTED Final   Metapneumovirus NOT DETECTED NOT DETECTED Final   Rhinovirus / Enterovirus NOT DETECTED NOT DETECTED Final   Influenza A NOT DETECTED NOT DETECTED Final   Influenza B NOT DETECTED NOT DETECTED Final   Parainfluenza Virus 1 NOT DETECTED NOT DETECTED Final   Parainfluenza Virus 2 NOT DETECTED NOT DETECTED Final   Parainfluenza Virus 3 NOT DETECTED NOT DETECTED Final   Parainfluenza Virus 4 NOT DETECTED NOT DETECTED Final   Respiratory Syncytial Virus NOT DETECTED  NOT DETECTED Final   Bordetella pertussis NOT DETECTED NOT DETECTED Final   Chlamydophila pneumoniae NOT DETECTED NOT DETECTED Final   Mycoplasma pneumoniae NOT DETECTED NOT DETECTED Final      Radiology Studies: Dg Chest 2 View  Result Date: 09/04/2017 CLINICAL DATA:  Atrial fibrillation and shortness of breath EXAM: CHEST  2 VIEW COMPARISON:  08/11/2017 CT and chest x-ray, chest x-ray 02/05/2014 FINDINGS: Cardiomegaly with small left greater than right pleural effusions. Left basilar atelectasis or pneumonia. Aortic atherosclerosis. Multiple compression deformities of the spine. IMPRESSION: Cardiomegaly with small left greater than right pleural effusions and left basilar atelectasis or pneumonia. Electronically Signed   By: Donavan Foil M.D.   On: 09/04/2017 01:30   Ct Head Wo Contrast  Result Date: 09/04/2017 CLINICAL DATA:  Initial evaluation for acute trauma, fall. EXAM: CT HEAD WITHOUT CONTRAST CT CERVICAL SPINE WITHOUT CONTRAST TECHNIQUE: Multidetector CT imaging of the head and cervical spine was performed following the standard protocol without intravenous contrast. Multiplanar CT image reconstructions of the cervical spine were also generated. COMPARISON:  Prior CT from 08/11/2017. FINDINGS: CT HEAD FINDINGS Brain: Age-related cerebral volume loss with moderate chronic small vessel ischemic disease no acute intracranial hemorrhage. No acute large vessel territory infarct. No mass lesion, midline shift or mass effect. No hydrocephalus. No extra-axial fluid collection. Vascular: No hyperdense vessel. Scattered vascular calcifications noted within the carotid siphons. Skull: Scalp soft tissues and calvarium within normal limits. Sinuses/Orbits: Globes and orbital soft tissues within normal limits. Paranasal sinuses and mastoid air cells are clear. Other: None. CT CERVICAL SPINE FINDINGS Alignment: Exaggeration of the normal cervical lordosis. Trace anterolisthesis of T2 on T3, stable. Skull  base and vertebrae: Skull base intact. Normal C1-2 articulations are preserved in the dens is intact. There is progressive height loss with sclerosis at the superior endplate of T3 as compared to previous, consistent with acute to subacute compression fracture. Height loss of approximate 75% with 3 mm bony retropulsion. The vertebral body heights otherwise maintained. No other acute fracture. Soft tissues and spinal canal: Soft tissues of the neck demonstrate no acute abnormality. No prevertebral edema. Spinal canal within normal limits. Disc levels: Mild degenerative disc bulging noted at C3-4 and C4-5 without significant stenosis. Left-sided facet arthropathy noted at C4-5 and C5-6. Upper chest: Visualized upper chest within normal limits. Partially visualized lung apices are clear. Other: None. IMPRESSION: CT BRAIN: 1. No acute intracranial process. 2. Moderate generalized age-related cerebral atrophy with moderate chronic small vessel ischemic disease. CT CERVICAL SPINE: 1. No acute cervical spine injury identified. 2. Acute to subacute compression fracture involving the  T3 vertebral body with up to 70% height loss and 3 mm bony retropulsion. There was a mild compression deformity at this level on prior study from 08/11/2017, suggesting that this may be subacute in nature. Correlation with physical exam recommended. Electronically Signed   By: Jeannine Boga M.D.   On: 09/04/2017 04:55   Ct Angio Chest Pe W And/or Wo Contrast  Result Date: 09/04/2017 CLINICAL DATA:  Back pain and shortness of breath. Fall, striking head on the floor. No loss of consciousness. History of dementia. Low oxygen saturation. EXAM: CT ANGIOGRAPHY CHEST WITH CONTRAST TECHNIQUE: Multidetector CT imaging of the chest was performed using the standard protocol during bolus administration of intravenous contrast. Multiplanar CT image reconstructions and MIPs were obtained to evaluate the vascular anatomy. CONTRAST:  115mL  ISOVUE-370 IOPAMIDOL (ISOVUE-370) INJECTION 76% COMPARISON:  None. FINDINGS: Cardiovascular: Good opacification of the central and segmental pulmonary arteries. No focal filling defects. No evidence of significant pulmonary embolus. Diffuse cardiac enlargement with particular dilatation of the right heart and left atrium. No pericardial effusion. Normal caliber thoracic aorta with scattered calcifications. Coronary artery calcifications. Mediastinum/Nodes: Esophagus is decompressed. No significant lymphadenopathy in the chest. Lungs/Pleura: Small bilateral pleural effusions with basilar atelectasis. Emphysematous changes in the lungs. Patchy areas of airspace disease bilaterally likely representing edema. Multifocal pneumonia is another possibility. No pneumothorax. Airways are patent. Upper Abdomen: Multiple low-attenuation lesions in the liver likely representing cysts. Musculoskeletal: Degenerative changes throughout the thoracic spine. Multiple thoracic vertebral compression deformities with ballooning of interspaces, likely indicating osteoporosis. Similar findings were demonstrated on previous CT thoracic spine from 08/11/2017. There is progression of compression at T3 since the previous study. Review of the MIP images confirms the above findings. IMPRESSION: 1. No evidence of significant pulmonary embolus. 2. Diffuse cardiac enlargement. 3. Bilateral pleural effusions and airspace infiltrates likely representing edema due to congestive failure. Pneumonia could also have this appearance. 4. Multiple thoracic compression fractures likely indicating osteoporosis. Progression of compression at T3 since the previous study. 5. Multiple hepatic cysts. Aortic Atherosclerosis (ICD10-I70.0). Electronically Signed   By: Lucienne Capers M.D.   On: 09/04/2017 04:37   Ct Cervical Spine Wo Contrast  Result Date: 09/04/2017 CLINICAL DATA:  Initial evaluation for acute trauma, fall. EXAM: CT HEAD WITHOUT CONTRAST CT  CERVICAL SPINE WITHOUT CONTRAST TECHNIQUE: Multidetector CT imaging of the head and cervical spine was performed following the standard protocol without intravenous contrast. Multiplanar CT image reconstructions of the cervical spine were also generated. COMPARISON:  Prior CT from 08/11/2017. FINDINGS: CT HEAD FINDINGS Brain: Age-related cerebral volume loss with moderate chronic small vessel ischemic disease no acute intracranial hemorrhage. No acute large vessel territory infarct. No mass lesion, midline shift or mass effect. No hydrocephalus. No extra-axial fluid collection. Vascular: No hyperdense vessel. Scattered vascular calcifications noted within the carotid siphons. Skull: Scalp soft tissues and calvarium within normal limits. Sinuses/Orbits: Globes and orbital soft tissues within normal limits. Paranasal sinuses and mastoid air cells are clear. Other: None. CT CERVICAL SPINE FINDINGS Alignment: Exaggeration of the normal cervical lordosis. Trace anterolisthesis of T2 on T3, stable. Skull base and vertebrae: Skull base intact. Normal C1-2 articulations are preserved in the dens is intact. There is progressive height loss with sclerosis at the superior endplate of T3 as compared to previous, consistent with acute to subacute compression fracture. Height loss of approximate 75% with 3 mm bony retropulsion. The vertebral body heights otherwise maintained. No other acute fracture. Soft tissues and spinal canal: Soft tissues of the neck  demonstrate no acute abnormality. No prevertebral edema. Spinal canal within normal limits. Disc levels: Mild degenerative disc bulging noted at C3-4 and C4-5 without significant stenosis. Left-sided facet arthropathy noted at C4-5 and C5-6. Upper chest: Visualized upper chest within normal limits. Partially visualized lung apices are clear. Other: None. IMPRESSION: CT BRAIN: 1. No acute intracranial process. 2. Moderate generalized age-related cerebral atrophy with moderate  chronic small vessel ischemic disease. CT CERVICAL SPINE: 1. No acute cervical spine injury identified. 2. Acute to subacute compression fracture involving the T3 vertebral body with up to 70% height loss and 3 mm bony retropulsion. There was a mild compression deformity at this level on prior study from 08/11/2017, suggesting that this may be subacute in nature. Correlation with physical exam recommended. Electronically Signed   By: Jeannine Boga M.D.   On: 09/04/2017 04:55     Scheduled Meds: . calcium-vitamin D  1 tablet Oral BID  . collagenase   Topical Daily  . escitalopram  5 mg Oral Daily  . feeding supplement (ENSURE ENLIVE)  237 mL Oral TID BM  . levothyroxine  137 mcg Oral QAC breakfast  . metoprolol tartrate  12.5 mg Oral BID  . multivitamin with minerals  1 tablet Oral Daily  . potassium chloride  40 mEq Oral BID  . simvastatin  40 mg Oral q1800  . Warfarin - Pharmacist Dosing Inpatient   Does not apply q1800   Continuous Infusions: . methocarbamol (ROBAXIN)  IV       LOS: 1 day   Time Spent in minutes   30 minutes  Chanci Ojala D.O. on 09/05/2017 at 3:35 PM  Between 7am to 7pm - Pager - [phone]  After 7pm go to [email or web address] - password [email or web address]  And look for the night coverage person covering for me after hours  [facility] Office  [phone]

## 2017-09-05 NOTE — Progress Notes (Signed)
ANTICOAGULATION CONSULT NOTE - Follow Up Consult  Pharmacy Consult for Coumadin Indication: atrial fibrillation  Allergies  Allergen Reactions  . Foradil [Formoterol] Other (See Comments)    Reaction not recalled (??)    Patient Measurements: Height: 5' (152.4 cm) Weight: 111 lb 12.4 oz (50.7 kg) IBW/kg (Calculated) : 45.5  Vital Signs: Temp: 97.7 F (36.5 C) (01/25 1200) Temp Source: Axillary (01/25 1200) BP: 102/75 (01/25 1200) Pulse Rate: 123 (01/25 1200)  Labs: Recent Labs    09/04/17 0102 09/05/17 0518  HGB 14.5 12.2  HCT 44.8 38.8  PLT 262 201  LABPROT 21.4* 29.3*  INR 1.88 2.80  CREATININE 1.14* 0.78  TROPONINI <0.03  --     Estimated Creatinine Clearance: 34.9 mL/min (by C-G formula based on SCr of 0.78 mg/dL).  Assessment:   84 yr old female continues on Coumadin as prior to admission for atrial fibrillation. Hx falls prior to admission, head CT negative for bleed. CTA negative for PE.  Multiple thoracic compression fractures, but no procedures planned.    INR is therapeutic today (2.80), but significantly increased from 1.88 after Coumadin 3 mg x 1 on 09/04/17.   Home Coumadin regimen: 2 mg daily.  Goal of Therapy:  INR 2-3 Monitor platelets by anticoagulation protocol: Yes   Plan:   No Coumadin today.  Daily PT/INR.  Arty Baumgartner, [place] Pager: [phone] or [phone] 09/05/2017,12:24 PM

## 2017-09-05 NOTE — Discharge Instructions (Addendum)
Apply Santyl to sacrum and left buttock wound Q day, then cover with moist gauze and foam dressing. (Change foam dressing Q 3 days or PRN soiling.)   Information on my medicine - Coumadin   (Warfarin)  Why was Coumadin prescribed for you? Coumadin was prescribed for you because you have a blood clot or a medical condition that can cause an increased risk of forming blood clots. Blood clots can cause serious health problems by blocking the flow of blood to the heart, lung, or brain. Coumadin can prevent harmful blood clots from forming. As a reminder your indication for Coumadin is:   Stroke Prevention Because Of Atrial Fibrillation  What test will check on my response to Coumadin? While on Coumadin (warfarin) you will need to have an INR test regularly to ensure that your dose is keeping you in the desired range. The INR (international normalized ratio) number is calculated from the result of the laboratory test called prothrombin time (PT).  If an INR APPOINTMENT HAS NOT ALREADY BEEN MADE FOR YOU please schedule an appointment to have this lab work done by your health care provider within 7 days. Your INR goal is usually a number between:  2 to 3 or your provider may give you a more narrow range like 2-2.5.  Ask your health care provider during an office visit what your goal INR is.  What  do you need to  know  About  COUMADIN? Take Coumadin (warfarin) exactly as prescribed by your healthcare provider about the same time each day.  DO NOT stop taking without talking to the doctor who prescribed the medication.  Stopping without other blood clot prevention medication to take the place of Coumadin may increase your risk of developing a new clot or stroke.  Get refills before you run out.  What do you do if you miss a dose? If you miss a dose, take it as soon as you remember on the same day then continue your regularly scheduled regimen the next day.  Do not take two doses of Coumadin at the same  time.  Important Safety Information A possible side effect of Coumadin (Warfarin) is an increased risk of bleeding. You should call your healthcare provider right away if you experience any of the following: ? Bleeding from an injury or your nose that does not stop. ? Unusual colored urine (red or dark brown) or unusual colored stools (red or black). ? Unusual bruising for unknown reasons. ? A serious fall or if you hit your head (even if there is no bleeding).  Some foods or medicines interact with Coumadin (warfarin) and might alter your response to warfarin. To help avoid this: ? Eat a balanced diet, maintaining a consistent amount of Vitamin K. ? Notify your provider about major diet changes you plan to make. ? Avoid alcohol or limit your intake to 1 drink for women and 2 drinks for men per day. (1 drink is 5 oz. wine, 12 oz. beer, or 1.5 oz. liquor.)  Make sure that ANY health care provider who prescribes medication for you knows that you are taking Coumadin (warfarin).  Also make sure the healthcare provider who is monitoring your Coumadin knows when you have started a new medication including herbals and non-prescription products.  Coumadin (Warfarin)  Major Drug Interactions  Increased Warfarin Effect Decreased Warfarin Effect  Alcohol (large quantities) Antibiotics (esp. Septra/Bactrim, Flagyl, Cipro) Amiodarone (Cordarone) Aspirin (ASA) Cimetidine (Tagamet) Megestrol (Megace) NSAIDs (ibuprofen, naproxen, etc.) Piroxicam (Feldene) Propafenone (Rythmol  SR) Propranolol (Inderal) Isoniazid (INH) Posaconazole (Noxafil) Barbiturates (Phenobarbital) Carbamazepine (Tegretol) Chlordiazepoxide (Librium) Cholestyramine (Questran) Griseofulvin Oral Contraceptives Rifampin Sucralfate (Carafate) Vitamin K   Coumadin (Warfarin) Major Herbal Interactions  Increased Warfarin Effect Decreased Warfarin Effect  Garlic Ginseng Ginkgo biloba Coenzyme Q10 Green tea St. Johns wort     Coumadin (Warfarin) FOOD Interactions  Eat a consistent number of servings per week of foods HIGH in Vitamin K (1 serving =  cup)  Collards (cooked, or boiled & drained) Kale (cooked, or boiled & drained) Mustard greens (cooked, or boiled & drained) Parsley *serving size only =  cup Spinach (cooked, or boiled & drained) Swiss chard (cooked, or boiled & drained) Turnip greens (cooked, or boiled & drained)  Eat a consistent number of servings per week of foods MEDIUM-HIGH in Vitamin K (1 serving = 1 cup)  Asparagus (cooked, or boiled & drained) Broccoli (cooked, boiled & drained, or raw & chopped) Brussel sprouts (cooked, or boiled & drained) *serving size only =  cup Lettuce, raw (green leaf, endive, romaine) Spinach, raw Turnip greens, raw & chopped   These websites have more information on Coumadin (warfarin):  FailFactory.se; VeganReport.com.au;

## 2017-09-05 NOTE — Progress Notes (Signed)
OT Cancellation Note  Patient Details Name: Wendy Flowers MRN: [id number] DOB: 06/21/1927   Cancelled Treatment:    Reason Eval/Treat Not Completed: Medical issues which prohibited therapy. Pts HR sustained in 120s-140s at rest. Cardiology consulted. Will hold therapy at this time and follow up as pt is medically appropriate.   Binnie Kand  M.S., OTR/L Pager: [phone]  09/05/2017, 2:58 PM

## 2017-09-05 NOTE — Progress Notes (Signed)
Patient HR sustaining 130s-140s - BP still soft (102) - No PRN meds ordered for HR - Spoke with Dr. Ree Kida, stated she has consulted cardiology to help manage HR, they should be by to see patient and we will go with their recommendations. For now, just monitor the patient. Patients primary RN, Ebony Hail, made aware.

## 2017-09-05 NOTE — Progress Notes (Signed)
Initial Nutrition Assessment  DOCUMENTATION CODES:   Severe malnutrition in context of chronic illness  INTERVENTION:    Ensure Enlive po TID, each supplement provides 350 kcal and 20 grams of protein  Multivitamin daily  NUTRITION DIAGNOSIS:   Severe Malnutrition related to chronic illness(dementia, HF) as evidenced by severe muscle depletion, percent weight loss(8% weight loss within 3 months).  GOAL:   Patient will meet greater than or equal to 90% of their needs  MONITOR:   PO intake, Supplement acceptance, Skin  REASON FOR ASSESSMENT:   Malnutrition Screening Tool    ASSESSMENT:   84 yo female with PMH of A fib, pulmonary HTN, LE edema, dementia, HTN, hypothyroidism, HLD, HF, osteoporosis, thoracic compression fx who was admitted on 1/24 with A fib with RVR, systolic heart failure exacerbation, and AKI.  Patient reports that she came in as a visitor and is now registered as a patient. She thinks she has lost weight since she retired. She is unsure if she has ever had Ensure, but she does like milk. She agreed to try Ensure (vanilla) between meals. Suspect intake PTA was poor. She has lost 8% of her usual weight in the past 3 months.  Labs reviewed. Potassium 3.1 (L) CBG: 124 Medications reviewed and include [place] with vitamin D.  NUTRITION - FOCUSED PHYSICAL EXAM:    Most Recent Value  Orbital Region  No depletion  Upper Arm Region  No depletion  Thoracic and Lumbar Region  No depletion  Buccal Region  No depletion  Temple Region  Moderate depletion  Clavicle Bone Region  Severe depletion  Clavicle and Acromion Bone Region  Severe depletion  Scapular Bone Region  Mild depletion  Dorsal Hand  Moderate depletion  Patellar Region  Mild depletion  Anterior Thigh Region  Mild depletion  Posterior Calf Region  Mild depletion  Edema (RD Assessment)  Moderate  Hair  Reviewed  Eyes  Reviewed  Mouth  Reviewed  Skin  Reviewed  Nails  Reviewed       Diet  Order:  Diet Heart Room service appropriate? Yes; Fluid consistency: Thin  EDUCATION NEEDS:   No education needs have been identified at this time  Skin:  Skin Assessment: Skin Integrity Issues: Skin Integrity Issues:: Stage III, Unstageable Stage III: sacrum Unstageable: L buttock  Last BM:  1/22  Height:   Ht Readings from Last 1 Encounters:  09/04/17 5' (1.524 m)    Weight:   Wt Readings from Last 1 Encounters:  09/05/17 111 lb 12.4 oz (50.7 kg)    Ideal Body Weight:  45.5 kg  BMI:  Body mass index is 21.83 kg/m.  Estimated Nutritional Needs:   Kcal:  1400-1600  Protein:  75-90 gm  Fluid:  1.4-1.6 L   Molli Barrows, RD, LDN, [place] Pager [phone] After Hours Pager [phone]

## 2017-09-05 NOTE — Consult Note (Signed)
The patient has been seen in conjunction with Vin Bhagat, PAC. All aspects of care have been considered and discussed. The patient has been personally interviewed, examined, and all clinical data has been reviewed.   History of chronic atrial fibrillation and poor rate control for at least the last 12 months when all EKGs are evaluated.  Theoretically she is on 50 mg of metoprolol twice daily.  Not sure if she is taking her medications.  Patient is unable to give history that is helpful.  She denies dyspnea and chest pain.  Neck veins are seen at the top of the clavicle bilaterally with the patient lying at 30 degrees.  Bilateral ankle edema 1+.  Clear lung fields to auscultation.  No significant murmur, rapid irregularly irregular heart rate/rhythm.  Echocardiogram is not greatly changed from prior study done in 2015.  EF 45-50%.  Major issue currently is rapid ventricular response.  Will reinstitute beta-blocker therapy (Lopressor 12.5 mg p.o. twice daily; 2.5 mg IV every 4 hours as needed heart rate greater than 778 bpm with systolic blood pressure greater than 100 mmHg).  Assuming she takes her medications at home, some of the rapid heart rate now could be related to rebound from sudden withdrawal.  If beta-blocker therapy is not helpful, after potassium is repleted would recommend either IV/p.o. digoxin or amiodarone.  Would not give further diuresis as the patient does not appear to be in congestive heart failure by clinical parameters.  Check albumin to rule out increased third space fluid due to low oncotic pressure.  Also note that her blood pressure runs in the 242 mmHg systolic range as an outpatient.  Cardiology Consultation:   Patient ID: Wendy Flowers; [id number]; Nov 29, 1926   Admit date: 09/04/2017 Date of Consult: 09/05/2017  Primary Care Provider: Deland Pretty, MD Primary Cardiologist:Dr. Croitoru   Patient Profile:   Wendy Flowers is a 84 y.o. female with a hx of persistent  atrial fibrillation on coumadin for anticoagulation, pulmonary hypertension, HLD and  Hypothyroidism who is being seen today for the evaluation of atrial fibrillation with RVR  at the request of Dr. Ree Kida.   She has mild to moderate pulmonary hypertension confirmed by right heart catheterization (PA pressure 49/20, mean 30, PA wedge pressure mean 16 mm Hg), but this appears to be stable in severity and is not clearly explained. The most likely cause is restrictive lung disease from fairly significant thoracic kyphoscoliosis in turn related to osteoporosis. She does not have a history of smoking does not have signs or symptoms of obstructive sleep apnea and has never had known venous thromboembolic events.  Last seen by Dr. Sallyanne Kuster 01/2015.  History of Present Illness:   Wendy Flowers presented from home 09/04/17 after fall. The patient had demential and unable to provide clear information. Hx obtained by review of chart. No family at bedside. No LOC. It was reported that she accidentally pressed her life alert necklace and deputies responded to the home.  Patient told them she was fine but then fell into the doorway and hit her head on the side of the door.  In ER patient found to be hypoxic with oxygen saturation of 86%. No PE of CTA of chest. Work up revealed evidence of volume overload with possible pneumonia. At least 3 falls reported in past few moths.  She was given IV lasix. Noted in afib rvr and started on IV cardizem however discontinued yesterday due to hypotension. Cardiology is asked for further treatment and  plan. Warfarin resumed per pharmacy.   K of 3.1. Scr 0.78. LDL 64. BNP 285.   Echo showed LVEF of 45-50% (EF is stable compared to EKG of 2015), no wm abnormality, RV pressure overload, moderate MR, severe dilated RA, PA pressure of 76mmg HG.    Past Medical History:  Diagnosis Date  . Accident due to mechanical fall without injury   . Chronic systolic heart failure ([place])   . Dementia    . HLD (hyperlipidemia)   . HTN (hypertension)   . Hypertensive left ventricular hypertrophy with heart failure ([place])   . Hypothyroidism   . Lower extremity edema   . Osteoporosis   . Permanent atrial fibrillation ([place])   . Pulmonary hypertension ([place])    dx'd by right heart cath/Croituro  . Thoracic compression fracture [facility])     Past Surgical History:  Procedure Laterality Date  . BREAST LUMPECTOMY  5638   left  . CARDIAC CATHETERIZATION  06/13/2010   R & LHC: no CAD  . CATARACT EXTRACTION  2008   bilateral  . [place]   left  . US ECHOCARDIOGRAPHY  02/04/2012   LA mod. dilated,RA mild-mod dilated,mild MR,mod. TR     Inpatient Medications: Scheduled Meds: . calcium-vitamin D  1 tablet Oral BID  . collagenase   Topical Daily  . escitalopram  5 mg Oral Daily  . levothyroxine  137 mcg Oral QAC breakfast  . simvastatin  40 mg Oral q1800  . Warfarin - Pharmacist Dosing Inpatient   Does not apply q1800   Continuous Infusions: . methocarbamol (ROBAXIN)  IV     PRN Meds: acetaminophen, methocarbamol (ROBAXIN)  IV, ondansetron (ZOFRAN) IV, traMADol  Allergies:    Allergies  Allergen Reactions  . Foradil [Formoterol] Other (See Comments)    Reaction not recalled (??)    Social History:   Social History   Socioeconomic History  . Marital status: Widowed    Spouse name: Not on file  . Number of children: Not on file  . Years of education: Not on file  . Highest education level: Not on file  Social Needs  . Financial resource strain: Not on file  . Food insecurity - worry: Not on file  . Food insecurity - inability: Not on file  . Transportation needs - medical: Not on file  . Transportation needs - non-medical: Not on file  Occupational History  . Not on file  Tobacco Use  . Smoking status: Never Smoker  . Smokeless tobacco: Never Used  Substance and Sexual Activity  . Alcohol use: Yes    Comment: seldom; 03/17/2013-occasionally drinks a glass of  wine before bed  . Drug use: No  . Sexual activity: Not on file  Other Topics Concern  . Not on file  Social History Narrative  . Not on file    Family History:    Family History  Problem Relation Age of Onset  . Heart attack Mother   . Heart attack Father   . Diabetes Brother   . Cancer Brother      ROS:  Please see the history of present illness.  All other ROS reviewed and negative.     Physical Exam/Data:   Vitals:   09/05/17 0131 09/05/17 0414 09/05/17 0748 09/05/17 1200  BP: 99/63 99/62 (!) 89/58 102/75  Pulse: (!) 120 (!) 125 (!) 129 (!) 123  Resp: (!) 26 (!) 23 (!) 24 (!) 27  Temp:  98 F (36.7 C)  97.7  F (36.5 C)  TempSrc:  Oral  Axillary  SpO2: 93% 97% 96% 90%  Weight:  111 lb 12.4 oz (50.7 kg)    Height:        Intake/Output Summary (Last 24 hours) at 09/05/2017 1428 Last data filed at 09/05/2017 1300 Gross per 24 hour  Intake 720 ml  Output -  Net 720 ml   Filed Weights   09/05/17 0414  Weight: 111 lb 12.4 oz (50.7 kg)   Body mass index is 21.83 kg/m.  General: Chronically ill appearing elderly frail female in no acute distress HEENT: normal Lymph: no adenopathy Neck: + JVD Endocrine:  No thryomegaly Vascular: No carotid bruits; FA pulses 2+ bilaterally without bruits  Cardiac:  normal S1, S2; IR IR tachycardia; systolic murmur Lungs: faint rales  Abd: soft, nontender, no hepatomegaly  Ext: 1+ BL LE edema Musculoskeletal:  No deformities, BUE and BLE strength normal and equal Skin: warm and dry  Neuro:  CNs 2-12 intact, no focal abnormalities noted Psych:  Normal affect   EKG:  The EKG was personally reviewed and demonstrates:  afib at rate of 129 bpm Telemetry:  Telemetry was personally reviewed and demonstrates:  afib at rate of > 120s  Relevant CV Studies: Echo 09/04/17 Study Conclusions  - Left ventricle: The cavity size was normal. Systolic function was   mildly reduced. The estimated ejection fraction was in the range   of  45% to 50%. Wall motion was normal; there were no regional   wall motion abnormalities. - Ventricular septum: The contour showed systolic flattening. These   changes are consistent with RV pressure overload. - Mitral valve: Mildly calcified, moderately fibrotic annulus.   There was moderate regurgitation. - Left atrium: The atrium was moderately dilated. - Right ventricle: The cavity size was moderately dilated. Wall   thickness was normal. Systolic function was reduced. - Right atrium: The atrium was severely dilated. - Tricuspid valve: There was moderate regurgitation. - Pulmonary arteries: Systolic pressure was moderately increased.   PA peak pressure: 60 mm Hg (S).  Laboratory Data:  Chemistry Recent Labs  Lab 09/04/17 0102 09/05/17 0518  NA 135 138  K 4.2 3.1*  CL 94* 98*  CO2 29 30  GLUCOSE 140* 100*  BUN 24* 15  CREATININE 1.14* 0.78  CALCIUM 9.8 8.2*  GFRNONAA 42* >60  GFRAA 48* >60  ANIONGAP 12 10    No results for input(s): PROT, ALBUMIN, AST, ALT, ALKPHOS, BILITOT in the last 168 hours. Hematology Recent Labs  Lab 09/04/17 0102 09/05/17 0518  WBC 9.2 7.1  RBC 5.14* 4.36  HGB 14.5 12.2  HCT 44.8 38.8  MCV 87.2 89.0  MCH 28.2 28.0  MCHC 32.4 31.4  RDW 15.2 15.7*  PLT 262 201   Cardiac Enzymes Recent Labs  Lab 09/04/17 0102  TROPONINI <0.03   No results for input(s): TROPIPOC in the last 168 hours.  BNP Recent Labs  Lab 09/04/17 0102  BNP 285.3*    DDimer No results for input(s): DDIMER in the last 168 hours.  Radiology/Studies:  Dg Chest 2 View  Result Date: 09/04/2017 CLINICAL DATA:  Atrial fibrillation and shortness of breath EXAM: CHEST  2 VIEW COMPARISON:  08/11/2017 CT and chest x-ray, chest x-ray 02/05/2014 FINDINGS: Cardiomegaly with small left greater than right pleural effusions. Left basilar atelectasis or pneumonia. Aortic atherosclerosis. Multiple compression deformities of the spine. IMPRESSION: Cardiomegaly with small left  greater than right pleural effusions and left basilar atelectasis or pneumonia. Electronically Signed  By: Donavan Foil M.D.   On: 09/04/2017 01:30   Ct Head Wo Contrast  Result Date: 09/04/2017 CLINICAL DATA:  Initial evaluation for acute trauma, fall. EXAM: CT HEAD WITHOUT CONTRAST CT CERVICAL SPINE WITHOUT CONTRAST TECHNIQUE: Multidetector CT imaging of the head and cervical spine was performed following the standard protocol without intravenous contrast. Multiplanar CT image reconstructions of the cervical spine were also generated. COMPARISON:  Prior CT from 08/11/2017. FINDINGS: CT HEAD FINDINGS Brain: Age-related cerebral volume loss with moderate chronic small vessel ischemic disease no acute intracranial hemorrhage. No acute large vessel territory infarct. No mass lesion, midline shift or mass effect. No hydrocephalus. No extra-axial fluid collection. Vascular: No hyperdense vessel. Scattered vascular calcifications noted within the carotid siphons. Skull: Scalp soft tissues and calvarium within normal limits. Sinuses/Orbits: Globes and orbital soft tissues within normal limits. Paranasal sinuses and mastoid air cells are clear. Other: None. CT CERVICAL SPINE FINDINGS Alignment: Exaggeration of the normal cervical lordosis. Trace anterolisthesis of T2 on T3, stable. Skull base and vertebrae: Skull base intact. Normal C1-2 articulations are preserved in the dens is intact. There is progressive height loss with sclerosis at the superior endplate of T3 as compared to previous, consistent with acute to subacute compression fracture. Height loss of approximate 75% with 3 mm bony retropulsion. The vertebral body heights otherwise maintained. No other acute fracture. Soft tissues and spinal canal: Soft tissues of the neck demonstrate no acute abnormality. No prevertebral edema. Spinal canal within normal limits. Disc levels: Mild degenerative disc bulging noted at C3-4 and C4-5 without significant stenosis.  Left-sided facet arthropathy noted at C4-5 and C5-6. Upper chest: Visualized upper chest within normal limits. Partially visualized lung apices are clear. Other: None. IMPRESSION: CT BRAIN: 1. No acute intracranial process. 2. Moderate generalized age-related cerebral atrophy with moderate chronic small vessel ischemic disease. CT CERVICAL SPINE: 1. No acute cervical spine injury identified. 2. Acute to subacute compression fracture involving the T3 vertebral body with up to 70% height loss and 3 mm bony retropulsion. There was a mild compression deformity at this level on prior study from 08/11/2017, suggesting that this may be subacute in nature. Correlation with physical exam recommended. Electronically Signed   By: Jeannine Boga M.D.   On: 09/04/2017 04:55   Ct Angio Chest Pe W And/or Wo Contrast  Result Date: 09/04/2017 CLINICAL DATA:  Back pain and shortness of breath. Fall, striking head on the floor. No loss of consciousness. History of dementia. Low oxygen saturation. EXAM: CT ANGIOGRAPHY CHEST WITH CONTRAST TECHNIQUE: Multidetector CT imaging of the chest was performed using the standard protocol during bolus administration of intravenous contrast. Multiplanar CT image reconstructions and MIPs were obtained to evaluate the vascular anatomy. CONTRAST:  176mL ISOVUE-370 IOPAMIDOL (ISOVUE-370) INJECTION 76% COMPARISON:  None. FINDINGS: Cardiovascular: Good opacification of the central and segmental pulmonary arteries. No focal filling defects. No evidence of significant pulmonary embolus. Diffuse cardiac enlargement with particular dilatation of the right heart and left atrium. No pericardial effusion. Normal caliber thoracic aorta with scattered calcifications. Coronary artery calcifications. Mediastinum/Nodes: Esophagus is decompressed. No significant lymphadenopathy in the chest. Lungs/Pleura: Small bilateral pleural effusions with basilar atelectasis. Emphysematous changes in the lungs. Patchy  areas of airspace disease bilaterally likely representing edema. Multifocal pneumonia is another possibility. No pneumothorax. Airways are patent. Upper Abdomen: Multiple low-attenuation lesions in the liver likely representing cysts. Musculoskeletal: Degenerative changes throughout the thoracic spine. Multiple thoracic vertebral compression deformities with ballooning of interspaces, likely indicating osteoporosis. Similar findings were  demonstrated on previous CT thoracic spine from 08/11/2017. There is progression of compression at T3 since the previous study. Review of the MIP images confirms the above findings. IMPRESSION: 1. No evidence of significant pulmonary embolus. 2. Diffuse cardiac enlargement. 3. Bilateral pleural effusions and airspace infiltrates likely representing edema due to congestive failure. Pneumonia could also have this appearance. 4. Multiple thoracic compression fractures likely indicating osteoporosis. Progression of compression at T3 since the previous study. 5. Multiple hepatic cysts. Aortic Atherosclerosis (ICD10-I70.0). Electronically Signed   By: Lucienne Capers M.D.   On: 09/04/2017 04:37   Ct Cervical Spine Wo Contrast  Result Date: 09/04/2017 CLINICAL DATA:  Initial evaluation for acute trauma, fall. EXAM: CT HEAD WITHOUT CONTRAST CT CERVICAL SPINE WITHOUT CONTRAST TECHNIQUE: Multidetector CT imaging of the head and cervical spine was performed following the standard protocol without intravenous contrast. Multiplanar CT image reconstructions of the cervical spine were also generated. COMPARISON:  Prior CT from 08/11/2017. FINDINGS: CT HEAD FINDINGS Brain: Age-related cerebral volume loss with moderate chronic small vessel ischemic disease no acute intracranial hemorrhage. No acute large vessel territory infarct. No mass lesion, midline shift or mass effect. No hydrocephalus. No extra-axial fluid collection. Vascular: No hyperdense vessel. Scattered vascular calcifications  noted within the carotid siphons. Skull: Scalp soft tissues and calvarium within normal limits. Sinuses/Orbits: Globes and orbital soft tissues within normal limits. Paranasal sinuses and mastoid air cells are clear. Other: None. CT CERVICAL SPINE FINDINGS Alignment: Exaggeration of the normal cervical lordosis. Trace anterolisthesis of T2 on T3, stable. Skull base and vertebrae: Skull base intact. Normal C1-2 articulations are preserved in the dens is intact. There is progressive height loss with sclerosis at the superior endplate of T3 as compared to previous, consistent with acute to subacute compression fracture. Height loss of approximate 75% with 3 mm bony retropulsion. The vertebral body heights otherwise maintained. No other acute fracture. Soft tissues and spinal canal: Soft tissues of the neck demonstrate no acute abnormality. No prevertebral edema. Spinal canal within normal limits. Disc levels: Mild degenerative disc bulging noted at C3-4 and C4-5 without significant stenosis. Left-sided facet arthropathy noted at C4-5 and C5-6. Upper chest: Visualized upper chest within normal limits. Partially visualized lung apices are clear. Other: None. IMPRESSION: CT BRAIN: 1. No acute intracranial process. 2. Moderate generalized age-related cerebral atrophy with moderate chronic small vessel ischemic disease. CT CERVICAL SPINE: 1. No acute cervical spine injury identified. 2. Acute to subacute compression fracture involving the T3 vertebral body with up to 70% height loss and 3 mm bony retropulsion. There was a mild compression deformity at this level on prior study from 08/11/2017, suggesting that this may be subacute in nature. Correlation with physical exam recommended. Electronically Signed   By: Jeannine Boga M.D.   On: 09/04/2017 04:55   Assessment and Plan:   1. Atrial fibrillation with RVR - Off Cardizem due to hypotension. Rate persistent above 120s. She does feels palpitation. Home BB held  due to soft BP. Echo showed stable LVEF. She was on Toprol XL 50mg  BID at dose. TSH normal.  2. Volume overload - Echo with stable LVEF. BNP 285. Given IV lasix 40mg  x 1 yesterday.  - Likely due to elevated rate.   3. Hypokalemia - Will give Kdur 71meq x 2.    For questions or updates, please contact [place] Please consult [email or web address] for contact info under Cardiology/STEMI.   [place]alia Longest [place]ater, [place]  09/05/2017 2:28 PM

## 2017-09-05 NOTE — Progress Notes (Signed)
Pt resting HR in the 120-140's Afib. Pt denies any pain or SOB. BP in the low 90's. Cardiology PA aware. Will cont to monitor pt.

## 2017-09-05 NOTE — Progress Notes (Signed)
PT Cancellation Note  Patient Details Name: Wendy Flowers MRN: [id number] DOB: 1926-12-09   Cancelled Treatment:    Reason Eval/Treat Not Completed: Medical issues which prohibited therapy.  HR is sustained in the 120's to 140's.  Will see 1/26 as able. 09/05/2017  Donnella Sham, PT [phone]  (pager)   Tessie Fass Nari Vannatter 09/05/2017, 3:17 PM

## 2017-09-05 NOTE — Care Management Note (Signed)
Case Management Note  Patient Details  Name: TENECIA IGNASIAK MRN: [id number] Date of Birth: May 15, 1927  Subjective/Objective: Pt presented for Atrial Fib. PTA from home alone. Pt states she has support of Nephew Max Kalman Shan that checks in on her daily. Pt states she has a cane, however does not use. PT/OT to consult for disposition needs.                  Action/Plan: CM will continue to monitor for home needs.   Expected Discharge Date:                  Expected Discharge Plan:  [place]  In-House Referral:  NA  Discharge planning Services  CM Consult  Post Acute Care Choice:    Choice offered to:     DME Arranged:    DME Agency:     HH Arranged:    HH Agency:     Status of Service:  In process, will continue to follow  If discussed at Long Length of Stay Meetings, dates discussed:    Additional Comments:  Bethena Roys, RN 09/05/2017, 3:30 PM

## 2017-09-06 DIAGNOSIS — Z7189 Other specified counseling: Secondary | ICD-10-CM

## 2017-09-06 DIAGNOSIS — E43 Unspecified severe protein-calorie malnutrition: Secondary | ICD-10-CM

## 2017-09-06 LAB — COMPREHENSIVE METABOLIC PANEL
ALBUMIN: 2.8 g/dL — AB (ref 3.5–5.0)
ALK PHOS: 97 U/L (ref 38–126)
ALT: 17 U/L (ref 14–54)
ANION GAP: 9 (ref 5–15)
AST: 22 U/L (ref 15–41)
BILIRUBIN TOTAL: 1 mg/dL (ref 0.3–1.2)
BUN: 21 mg/dL — ABNORMAL HIGH (ref 6–20)
CALCIUM: 9.6 mg/dL (ref 8.9–10.3)
CO2: 29 mmol/L (ref 22–32)
Chloride: 98 mmol/L — ABNORMAL LOW (ref 101–111)
Creatinine, Ser: 0.73 mg/dL (ref 0.44–1.00)
GFR calc Af Amer: >60 mL/min (ref >60)
GLUCOSE: 134 mg/dL — AB (ref 65–99)
POTASSIUM: 5.2 mmol/L — AB (ref 3.5–5.1)
Sodium: 136 mmol/L (ref 135–145)
TOTAL PROTEIN: 5.3 g/dL — AB (ref 6.5–8.1)

## 2017-09-06 LAB — CBC
HEMATOCRIT: 40.3 % (ref 36.0–46.0)
HEMOGLOBIN: 12.7 g/dL (ref 12.0–15.0)
MCH: 27.7 pg (ref 26.0–34.0)
MCHC: 31.5 g/dL (ref 30.0–36.0)
MCV: 88 fL (ref 78.0–100.0)
Platelets: 202 10*3/uL (ref 150–400)
RBC: 4.58 MIL/uL (ref 3.87–5.11)
RDW: 15.4 % (ref 11.5–15.5)
WBC: 8.5 10*3/uL (ref 4.0–10.5)

## 2017-09-06 LAB — PROTIME-INR
INR: 2.78
PROTHROMBIN TIME: 29.1 s — AB (ref 11.4–15.2)

## 2017-09-06 MED ORDER — WARFARIN 0.5 MG HALF TABLET
0.5000 mg | ORAL_TABLET | Freq: Once | ORAL | Status: AC
  Administered 2017-09-06: 0.5 mg via ORAL
  Filled 2017-09-06: qty 1

## 2017-09-06 MED ORDER — DIGOXIN 250 MCG PO TABS
0.2500 mg | ORAL_TABLET | ORAL | Status: AC
  Administered 2017-09-06 (×3): 0.25 mg via ORAL
  Filled 2017-09-06 (×3): qty 1

## 2017-09-06 MED ORDER — METOPROLOL TARTRATE 25 MG PO TABS
25.0000 mg | ORAL_TABLET | Freq: Two times a day (BID) | ORAL | Status: DC
  Administered 2017-09-06: 25 mg via ORAL
  Filled 2017-09-06 (×2): qty 1

## 2017-09-06 MED ORDER — DIGOXIN 125 MCG PO TABS
0.1250 mg | ORAL_TABLET | Freq: Every day | ORAL | Status: DC
  Administered 2017-09-07 – 2017-09-09 (×3): 0.125 mg via ORAL
  Filled 2017-09-06 (×3): qty 1

## 2017-09-06 MED ORDER — ESCITALOPRAM OXALATE 10 MG PO TABS
10.0000 mg | ORAL_TABLET | Freq: Every day | ORAL | Status: DC
  Administered 2017-09-07 – 2017-09-09 (×3): 10 mg via ORAL
  Filled 2017-09-06 (×3): qty 1

## 2017-09-06 NOTE — Progress Notes (Addendum)
PROGRESS NOTE    Wendy Flowers  KYH:[id number] DOB: 02/13/1927 DOA: 09/04/2017 PCP: Deland Pretty, MD   Chief Complaint  Patient presents with  . Fall    Brief Narrative:  HPI on 09/04/2017 by [name], NP [name] is a 84 y.o. female with medical history significant for known pulmonary hypertension diagnosed by right heart cath/C[place]uro, chronic atrial fibrillation on warfarin, hypertension, hypothyroidism, systolic heart failure with restrictive physiology, osteoporosis with chronic compression fractures and dyslipidemia.  Was brought to the ER via EMS after a fall at home.  It was reported that she accidentally pressed her life alert necklace and deputies responded to the home.  Patient told them she was fine but then fell into the doorway and hit her head on the side of the door.  She was also complaining of some back pain prior to the fall.  Patient was noted to be in atrial fibrillation with RVR with SBPs between 90 and 100.  After arrival to the ER it was noted her pulse oximetry was dropping to 86% on room air therefore nasal cannula oxygen was applied.  CT imaging of head and cervical spine unrevealing for traumatic injury.  Chest x-ray revealed cardiomegaly with small pleural effusions with left basilar atelectasis or pneumonia.  Given her presentation with hypoxemia and lower extremity edema as well as subtherapeutic INR CT angiography of the chest was completed to rule out PE.  No PE was detected but bilateral pleural effusions were confirmed as well as airspace infiltrates likely representing edema due to congestive heart failure.  In addition there was an incidental finding of multiple thoracic compression fractures consistent with osteoporosis with progression of the T3 fracture since previous study.  Patient's BNP was slightly elevated at 285 and she had a mild acute kidney injury.  She has been given Lasix 40 mg IV with marked increase in urinary output.  Assessment & Plan    Atrial fibrillation with RVR -CHADSVASC 5 -Continue Coumadin per pharmacy -cardiology consulted and appreciated, recommended lopressor 12.5mg  BID given hypotension -HR continues to be uncontrolled, ?dig vs amiodarone  Acute respiratory failure with hypoxia -Likely multifactorial including systolic heart failure, pulmonary hypertension -Continue to treat heart failure and monitor -Continue supplemental oxygen to maintain saturations above 92%  Acute on chronic systolic heart failure  -BNP 285.3 -presented with lower extremity edema -Echocardiogram in 2015: EF 45-50% -cardiology not recommending diuresis at this time given low BP  Acute kidney injury -Baseline creatinine approximate 0.7  Hypotension -possibly secondary to AF/beta blocker, diuresis -per cardiology, outpatient SBP is around 100  -BP currently improved, continue to monitor   Fall -PT/OT consulted  Thoracic compression fracture -Unknown at this is acute versus subacute -Interventional radiology consulted and appreciated, patient deemed not to be candidate for vertebral augmentation -Continue pain control -As above, PT OT consulted  Dementia -Not on medications prior to admission -Discussed with patient's nephew at bedside who is also her power of attorney -likely should not be living at home alone  Hypothyroidism -Continue Synthroid -TSH 2.545  Dyslipidemia -Continue statin  Goals of care -discussed with nephew at bedside, he would like to discuss her code status with the rest of the family -Dicussed with several family members today, will transition patient to DNR  Severe protein calorie malnutrition -nutrition consulted, continue supplements  DVT Prophylaxis  Coumadin  Code Status: Full  Family Communication: Family at bedside  Disposition Plan: Admitted. Suspect patient will need SNF at discharge when medically stable  Consultants Cardiology Interventional radiology  Procedures   None  Antibiotics   Anti-infectives (From admission, onward)   None      Subjective:   Wendy Flowers seen and examined today.  Dementia. No complaints, denies chest pain or shortness of breath.  Objective:   Vitals:   09/05/17 1933 09/05/17 2117 09/06/17 0425 09/06/17 0804  BP: 101/72 97/84 107/83 (!) 119/99  Pulse:  (!) 130 (!) 116   Resp:  (!) 35 (!) 38 (!) 29  Temp:  98.5 F (36.9 C) 98.8 F (37.1 C)   TempSrc:  Axillary Oral   SpO2:  97% 91%   Weight:   52.3 kg (115 lb 4.8 oz)   Height:        Intake/Output Summary (Last 24 hours) at 09/06/2017 1126 Last data filed at 09/06/2017 0998 Gross per 24 hour  Intake 720 ml  Output 200 ml  Net 520 ml   Filed Weights   09/05/17 0414 09/06/17 0425  Weight: 50.7 kg (111 lb 12.4 oz) 52.3 kg (115 lb 4.8 oz)    Exam  General: Well developed, chronically ill appearing, frail, NAD  HEENT: NCAT, mucous membranes moist.   Cardiovascular: S1 S2 auscultated, irregularly irregular  Respiratory: Clear to auscultation bilaterally, no wheezing  Abdomen: Soft, nontender, nondistended, + bowel sounds  Extremities: warm dry without cyanosis clubbing. +LE edema-improving   Neuro: AAOx2, nonfocal, dementia  Psych: pleasant an appropriate   Data Reviewed: I have personally reviewed following labs and imaging studies  CBC: Recent Labs  Lab 09/04/17 0102 09/05/17 0518 09/06/17 0449  WBC 9.2 7.1 8.5  HGB 14.5 12.2 12.7  HCT 44.8 38.8 40.3  MCV 87.2 89.0 88.0  PLT 262 201 338   Basic Metabolic Panel: Recent Labs  Lab 09/04/17 0102 09/05/17 0518 09/06/17 0449  NA 135 138 136  K 4.2 3.1* 5.2*  CL 94* 98* 98*  CO2 29 30 29   GLUCOSE 140* 100* 134*  BUN 24* 15 21*  CREATININE 1.14* 0.78 0.73  CALCIUM 9.8 8.2* 9.6   GFR: Estimated Creatinine Clearance: 34.9 mL/min (by C-G formula based on SCr of 0.73 mg/dL). Liver Function Tests: Recent Labs  Lab 09/06/17 0449  AST 22  ALT 17  ALKPHOS 97  BILITOT 1.0  PROT  5.3*  ALBUMIN 2.8*   No results for input(s): LIPASE, AMYLASE in the last 168 hours. No results for input(s): AMMONIA in the last 168 hours. Coagulation Profile: Recent Labs  Lab 09/04/17 0102 09/05/17 0518 09/06/17 0449  INR 1.88 2.80 2.78   Cardiac Enzymes: Recent Labs  Lab 09/04/17 0102  TROPONINI <0.03   BNP (last 3 results) No results for input(s): PROBNP in the last 8760 hours. HbA1C: No results for input(s): HGBA1C in the last 72 hours. CBG: Recent Labs  Lab 09/05/17 1156  GLUCAP 124*   Lipid Profile: Recent Labs    09/05/17 0518  CHOL 113  HDL 39*  LDLCALC 64  TRIG 49  CHOLHDL 2.9   Thyroid Function Tests: Recent Labs    09/04/17 0102  TSH 2.545   Anemia Panel: No results for input(s): VITAMINB12, FOLATE, FERRITIN, TIBC, IRON, RETICCTPCT in the last 72 hours. Urine analysis:    Component Value Date/Time   COLORURINE STRAW (A) 09/04/2017 0759   APPEARANCEUR CLEAR 09/04/2017 0759   LABSPEC 1.011 09/04/2017 0759   PHURINE 7.0 09/04/2017 [place] 09/04/2017 0759   HGBUR NEGATIVE 09/04/2017 0759   BILIRUBINUR NEGATIVE 09/04/2017 0759   KETONESUR NEGATIVE  09/04/2017 [place] 09/04/2017 0759   UROBILINOGEN 1.0 02/05/2014 1258   NITRITE NEGATIVE 09/04/2017 0759   LEUKOCYTESUR NEGATIVE 09/04/2017 0759   Sepsis Labs: @LABRCNTIP (procalcitonin:4,lacticidven:4)  ) Recent Results (from the past 240 hour(s))  Culture, Urine     Status: None   Collection Time: 09/04/17  7:59 AM  Result Value Ref Range Status   Specimen Description URINE, CLEAN CATCH  Final   Special Requests NONE  Final   Culture NO GROWTH  Final   Report Status 09/05/2017 FINAL  Final  Respiratory Panel by PCR     Status: None   Collection Time: 09/04/17  8:11 AM  Result Value Ref Range Status   Adenovirus NOT DETECTED NOT DETECTED Final   Coronavirus 229E NOT DETECTED NOT DETECTED Final   Coronavirus HKU1 NOT DETECTED NOT DETECTED Final    Coronavirus NL63 NOT DETECTED NOT DETECTED Final   Coronavirus OC43 NOT DETECTED NOT DETECTED Final   Metapneumovirus NOT DETECTED NOT DETECTED Final   Rhinovirus / Enterovirus NOT DETECTED NOT DETECTED Final   Influenza A NOT DETECTED NOT DETECTED Final   Influenza B NOT DETECTED NOT DETECTED Final   Parainfluenza Virus 1 NOT DETECTED NOT DETECTED Final   Parainfluenza Virus 2 NOT DETECTED NOT DETECTED Final   Parainfluenza Virus 3 NOT DETECTED NOT DETECTED Final   Parainfluenza Virus 4 NOT DETECTED NOT DETECTED Final   Respiratory Syncytial Virus NOT DETECTED NOT DETECTED Final   Bordetella pertussis NOT DETECTED NOT DETECTED Final   Chlamydophila pneumoniae NOT DETECTED NOT DETECTED Final   Mycoplasma pneumoniae NOT DETECTED NOT DETECTED Final      Radiology Studies: No results found.   Scheduled Meds: . calcium-vitamin D  1 tablet Oral BID  . collagenase   Topical Daily  . escitalopram  5 mg Oral Daily  . feeding supplement (ENSURE ENLIVE)  237 mL Oral TID BM  . levothyroxine  137 mcg Oral QAC breakfast  . metoprolol tartrate  12.5 mg Oral BID  . multivitamin with minerals  1 tablet Oral Daily  . potassium chloride  40 mEq Oral BID  . simvastatin  40 mg Oral q1800  . warfarin  0.5 mg Oral ONCE-1800  . Warfarin - Pharmacist Dosing Inpatient   Does not apply q1800   Continuous Infusions: . methocarbamol (ROBAXIN)  IV       LOS: 2 days   Time Spent in minutes   45 minutes  Valleri Hendricksen D.O. on 09/06/2017 at 11:26 AM  Between 7am to 7pm - Pager - [phone]  After 7pm go to [email or web address] - password [email or web address]  And look for the night coverage person covering for me after hours  [facility] Office  [phone]

## 2017-09-06 NOTE — Progress Notes (Signed)
OT Cancellation Note  Patient Details Name: Wendy Flowers MRN: [id number] DOB: 14-Apr-1927   Cancelled Treatment:    Reason Eval/Treat Not Completed: Medical issues which prohibited therapy. HR  High at rest, 128-146. Will check back later as time allows/as appropriate    Britt Bottom 09/06/2017, 12:37 PM

## 2017-09-06 NOTE — Progress Notes (Signed)
ANTICOAGULATION CONSULT NOTE - Follow Up Consult  Pharmacy Consult for Coumadin Indication: atrial fibrillation  Allergies  Allergen Reactions  . Foradil [Formoterol] Other (See Comments)    Reaction not recalled (??)    Patient Measurements: Height: 5' (152.4 cm) Weight: 115 lb 4.8 oz (52.3 kg) IBW/kg (Calculated) : 45.5  Vital Signs: Temp: 98.8 F (37.1 C) (01/26 0425) Temp Source: Oral (01/26 0425) BP: 107/83 (01/26 0425) Pulse Rate: 116 (01/26 0425)  Labs: Recent Labs    09/04/17 0102 09/05/17 0518 09/06/17 0449  HGB 14.5 12.2 12.7  HCT 44.8 38.8 40.3  PLT 262 201 202  LABPROT 21.4* 29.3* 29.1*  INR 1.88 2.80 2.78  CREATININE 1.14* 0.78 0.73  TROPONINI <0.03  --   --     Estimated Creatinine Clearance: 34.9 mL/min (by C-G formula based on SCr of 0.73 mg/dL).  Assessment: 84 yr old female continues on Coumadin as prior to admission for atrial fibrillation. Hx falls prior to admission, head CT negative for bleed. CTA negative for PE. Multiple thoracic compression fractures, but no procedures planned.  INR is therapeutic today (2.78), after holding dose last night.  Home Coumadin regimen: 2 mg daily.  1/24 PM 3 mg x1 > INR went from 1.14 to 2.80. Dose held 1/25 PM.  Goal of Therapy:  INR 2-3 Monitor platelets by anticoagulation protocol: Yes   Plan:  0.5 mg warfarin x 1 tonight Daily PT/INR.  [employer], [place] 09/06/2017,8:41 AM

## 2017-09-06 NOTE — Progress Notes (Signed)
Progress Note  Patient Name: Wendy Flowers Date of Encounter: 09/06/2017  Primary Cardiologist: No primary care provider on file.   Subjective   She appears very comfortable at rest. She denies dyspnea. She has had evident cognitive decline since I last saw her in June 2016. She does not remember me at all. I saw her in clinic several years in succession and performed her right and left heart catheterization.  Inpatient Medications    Scheduled Meds: . calcium-vitamin D  1 tablet Oral BID  . collagenase   Topical Daily  . escitalopram  5 mg Oral Daily  . feeding supplement (ENSURE ENLIVE)  237 mL Oral TID BM  . levothyroxine  137 mcg Oral QAC breakfast  . metoprolol tartrate  12.5 mg Oral BID  . multivitamin with minerals  1 tablet Oral Daily  . potassium chloride  40 mEq Oral BID  . simvastatin  40 mg Oral q1800  . warfarin  0.5 mg Oral ONCE-1800  . Warfarin - Pharmacist Dosing Inpatient   Does not apply q1800   Continuous Infusions: . methocarbamol (ROBAXIN)  IV     PRN Meds: acetaminophen, methocarbamol (ROBAXIN)  IV, metoprolol tartrate, ondansetron (ZOFRAN) IV, traMADol   Vital Signs    Vitals:   09/05/17 1933 09/05/17 2117 09/06/17 0425 09/06/17 0804  BP: 101/72 97/84 107/83 (!) 119/99  Pulse:  (!) 130 (!) 116   Resp:  (!) 35 (!) 38 (!) 29  Temp:  98.5 F (36.9 C) 98.8 F (37.1 C)   TempSrc:  Axillary Oral   SpO2:  97% 91%   Weight:   115 lb 4.8 oz (52.3 kg)   Height:        Intake/Output Summary (Last 24 hours) at 09/06/2017 1239 Last data filed at 09/06/2017 4627 Gross per 24 hour  Intake 720 ml  Output 200 ml  Net 520 ml   Filed Weights   09/05/17 0414 09/06/17 0425  Weight: 111 lb 12.4 oz (50.7 kg) 115 lb 4.8 oz (52.3 kg)    Telemetry    Atrial fibrillation with rapid ventricular response - Personally Reviewed  ECG    Atrial fibrillation with rapid ventricular response - Personally Reviewed  Physical Exam  Appears comfortable but slightly  disoriented GEN: No acute distress.   Neck: 3-4 centimeter JVP Cardiac: rapid and irregular rhythm, intensified S2,no murmurs, rubs, or gallops.  Respiratory: Clear to auscultation bilaterally. GI: Soft, nontender, non-distended  MS: symmetrical trace ankle edema; No deformity. Neuro:  Nonfocal  Psych: Normal affect   Labs    Chemistry Recent Labs  Lab 09/04/17 0102 09/05/17 0518 09/06/17 0449  NA 135 138 136  K 4.2 3.1* 5.2*  CL 94* 98* 98*  CO2 29 30 29   GLUCOSE 140* 100* 134*  BUN 24* 15 21*  CREATININE 1.14* 0.78 0.73  CALCIUM 9.8 8.2* 9.6  PROT  --   --  5.3*  ALBUMIN  --   --  2.8*  AST  --   --  22  ALT  --   --  17  ALKPHOS  --   --  97  BILITOT  --   --  1.0  GFRNONAA 42* >60 >60  GFRAA 48* >60 >60  ANIONGAP 12 10 9      Hematology Recent Labs  Lab 09/04/17 0102 09/05/17 0518 09/06/17 0449  WBC 9.2 7.1 8.5  RBC 5.14* 4.36 4.58  HGB 14.5 12.2 12.7  HCT 44.8 38.8 40.3  MCV 87.2 89.0  88.0  MCH 28.2 28.0 27.7  MCHC 32.4 31.4 31.5  RDW 15.2 15.7* 15.4  PLT 262 201 202    Cardiac Enzymes Recent Labs  Lab 09/04/17 0102  TROPONINI <0.03   No results for input(s): TROPIPOC in the last 168 hours.   BNP Recent Labs  Lab 09/04/17 0102  BNP 285.3*     DDimer No results for input(s): DDIMER in the last 168 hours.   Radiology    No results found.  Cardiac Studies  Echo Sep 04 2017 Study Conclusions  - Left ventricle: The cavity size was normal. Systolic function was   mildly reduced. The estimated ejection fraction was in the range   of 45% to 50%. Wall motion was normal; there were no regional   wall motion abnormalities. - Ventricular septum: The contour showed systolic flattening. These   changes are consistent with RV pressure overload. - Mitral valve: Mildly calcified, moderately fibrotic annulus.   There was moderate regurgitation. - Left atrium: The atrium was moderately dilated. - Right ventricle: The cavity size was moderately  dilated. Wall   thickness was normal. Systolic function was reduced. - Right atrium: The atrium was severely dilated. - Tricuspid valve: There was moderate regurgitation. - Pulmonary arteries: Systolic pressure was moderately increased.   PA peak pressure: 60 mm Hg (S).  Patient Profile     84 y.o. female with long-standing history of permanent atrial fibrillation and moderate pulmonary hypertension, admitted after a fall at home for atrial fibrillation rapid ventricular response  Assessment & Plan    1. AFib: when I last saw her in 2016 she was having problems with rapid ventricular response in the setting ofiatrogenic hyperthyroidism. She has rapid ventricular response now despite euthyroid status.ate control has was benign little challenging due to her tendency for hypotension. Will increase the dose of beta blocker and add digoxin.she is anticoagulated. Her INR was close to therapeutic range on admission. 2. PAH: she has moderate pulmonary hypertension which previous right catheterization has shown to be secondary to primary pulmonary arteriolar obstruction, not due to left heart failure. The exact cause was never established, but the abnormality appeared to be stable in severity. The most likely cause was felt to be restrictive lung disease from thoracic kyphoscoliosis. The echo performed on this admission shows only a mild worsening of PAH compared to previous evaluations 2011-2015. There is minimum evidence of right heart failure findings on exam today. 3. CMP: I'll be depressed LVEF, stable compared with previous evaluations. Consider infiltrative cardiomyopathy in view of severe biatrial enlargement. She does not have angina pectoris. There is no indication for invasive evaluation for CAD. 4. Cognitive decline/dementia: this is the first time I have seen Mrs. Nowack in almost 3 years and there has been substantial worsening of her cognitive status.she was admitted with supratherapeutic INR and  a fall last October. On this admission, her INR was close to therapeutic, suggesting that she is compliant with her medicines.  For questions or updates, please contact [place] Please consult [email or web address] for contact info under Cardiology/STEMI.      Signed, Sanda Klein, MD  09/06/2017, 12:39 PM

## 2017-09-06 NOTE — Progress Notes (Signed)
PT Cancellation Note  Patient Details Name: Wendy Flowers MRN: [id number] DOB: 17-May-1927   Cancelled Treatment:    Reason Eval/Treat Not Completed: Medical issues which prohibited therapy.  Pulses were quite high, 128-146.  Deferred tx as pt is not stable enough to push her mobility.  Try again later as pt and time allow.   Ramond Dial 09/06/2017, 11:50 AM   Mee Hives, PT MS Acute Rehab Dept. Number: [place] [facility] [place]

## 2017-09-07 DIAGNOSIS — E43 Unspecified severe protein-calorie malnutrition: Secondary | ICD-10-CM

## 2017-09-07 LAB — BASIC METABOLIC PANEL
Anion gap: 8 (ref 5–15)
BUN: 18 mg/dL (ref 6–20)
CALCIUM: 9.7 mg/dL (ref 8.9–10.3)
CO2: 30 mmol/L (ref 22–32)
CREATININE: 0.72 mg/dL (ref 0.44–1.00)
Chloride: 97 mmol/L — ABNORMAL LOW (ref 101–111)
GFR calc Af Amer: >60 mL/min (ref >60)
Glucose, Bld: 113 mg/dL — ABNORMAL HIGH (ref 65–99)
POTASSIUM: 5.5 mmol/L — AB (ref 3.5–5.1)
SODIUM: 135 mmol/L (ref 135–145)

## 2017-09-07 LAB — CBC
HEMATOCRIT: 41.8 % (ref 36.0–46.0)
Hemoglobin: 13 g/dL (ref 12.0–15.0)
MCH: 27.7 pg (ref 26.0–34.0)
MCHC: 31.1 g/dL (ref 30.0–36.0)
MCV: 88.9 fL (ref 78.0–100.0)
PLATELETS: 196 10*3/uL (ref 150–400)
RBC: 4.7 MIL/uL (ref 3.87–5.11)
RDW: 15.4 % (ref 11.5–15.5)
WBC: 7.8 10*3/uL (ref 4.0–10.5)

## 2017-09-07 LAB — PROTIME-INR
INR: 1.87
PROTHROMBIN TIME: 21.4 s — AB (ref 11.4–15.2)

## 2017-09-07 MED ORDER — WARFARIN SODIUM 2 MG PO TABS
2.0000 mg | ORAL_TABLET | Freq: Once | ORAL | Status: AC
  Administered 2017-09-07: 2 mg via ORAL
  Filled 2017-09-07: qty 1

## 2017-09-07 MED ORDER — FUROSEMIDE 10 MG/ML IJ SOLN
20.0000 mg | Freq: Once | INTRAMUSCULAR | Status: AC
  Administered 2017-09-07: 20 mg via INTRAVENOUS
  Filled 2017-09-07: qty 2

## 2017-09-07 MED ORDER — METOPROLOL TARTRATE 25 MG PO TABS
37.5000 mg | ORAL_TABLET | Freq: Two times a day (BID) | ORAL | Status: AC
  Administered 2017-09-07 – 2017-09-08 (×4): 37.5 mg via ORAL
  Filled 2017-09-07 (×4): qty 1

## 2017-09-07 NOTE — Evaluation (Signed)
Physical Therapy Evaluation Patient Details Name: Wendy Flowers MRN: [id number] DOB: 07/10/1927 Today's Date: 09/07/2017   History of Present Illness  84 y.o.femalewith medical history significant forknown pulmonary hypertension diagnosed by right heart cath/Croituro,chronic atrial fibrillation on warfarin, hypertension, hypothyroidism, systolic heart failure with restrictive physiology, osteoporosis with chronic compression fractures and dyslipidemia. Pt presenting after fall at home where she hit her head. Pt noted to be in afib with RVR. CT negative for acute findings, pt desating on RA in ED, chest xray revealed cardiomegaly with small pleural effusions, and multiple thoracic compression fractures.  Clinical Impression  Pt admitted with above diagnosis. Pt currently with functional limitations due to the deficits listed below (see PT Problem List). Pt very weak on eval, required min A and RW to pivot to chair and had difficulty stepping feet as well as being off balance. O2 sats down to 86% on RA, returned to 91% when 2L O2 reapplied.HR up to low 120's with activity, 97 bpm after session. Pt currently with cognitive deficits including not knowing where she is and not realizing she had bowel incontinence in bed. Pt will benefit from skilled PT to increase their independence and safety with mobility to allow discharge to the venue listed below.       Follow Up Recommendations SNF;Supervision/Assistance - 24 hour    Equipment Recommendations  None recommended by PT    Recommendations for Other Services       Precautions / Restrictions Precautions Precautions: Fall Precaution Comments: watch HR Restrictions Weight Bearing Restrictions: No      Mobility  Bed Mobility Overal bed mobility: Needs Assistance Bed Mobility: Supine to Sit Rolling: Supervision   Supine to sit: Min assist;HOB elevated     General bed mobility comments: prior to sitting up, pt was incontinent of stool,  rolling to each side for clean up. Pt able to roll with use of rail. assist to slide legs off bed and elevate trunk  Transfers Overall transfer level: Needs assistance Equipment used: Rolling walker (2 wheeled) Transfers: Sit to/from [facility] Sit to Stand: Min assist Stand pivot transfers: Min assist       General transfer comment: vc's for hand placement with RW and despite this, pt held onto RW all the way to sitting in chair, pulling RW with her. Pt had difficulty stepping feet to chair and increased fatigue with activity  Ambulation/Gait             General Gait Details: too fatigued today  Stairs            Wheelchair Mobility    Modified Rankin (Stroke Patients Only)       Balance Overall balance assessment: Needs assistance Sitting-balance support: Feet supported Sitting balance-Leahy Scale: Fair Sitting balance - Comments: posterior lean, corrected with vc's Postural control: Posterior lean Standing balance support: Bilateral upper extremity supported Standing balance-Leahy Scale: Poor                               Pertinent Vitals/Pain Pain Assessment: No/denies pain    Home Living Family/patient expects to be discharged to:: Private residence Living Arrangements: Alone Available Help at Discharge: Family;Available PRN/intermittently Type of Home: House Home Access: Stairs to enter Entrance Stairs-Rails: Right;Left;Can reach both Entrance Stairs-Number of Steps: 3 at back entrance which is the entrance she uses  Home Layout: Two level;Laundry or work area in [place]: Kasandra Knudsen - single point;Shower seat;Grab bars -  tub/shower;Walker - 4 wheels      Prior Function Level of Independence: Independent with assistive device(s)         Comments: Pt reports she uses RW for mobility, someone comes in for cleaning her house, only sponge bathes recently     Hand Dominance   Dominant Hand: Right     Extremity/Trunk Assessment   Upper Extremity Assessment Upper Extremity Assessment: Defer to OT evaluation    Lower Extremity Assessment Lower Extremity Assessment: Generalized weakness    Cervical / Trunk Assessment Cervical / Trunk Assessment: Kyphotic  Communication   Communication: HOH  Cognition Arousal/Alertness: Awake/alert Behavior During Therapy: WFL for tasks assessed/performed Overall Cognitive Status: No family/caregiver present to determine baseline cognitive functioning                                 General Comments: follows simple directions but not oriented to place or time.       General Comments General comments (skin integrity, edema, etc.): HR as high as 120 bpm but most of the time maintained in 110's. Pt desat to 86% on RA. Back to 91% on 2L    Exercises     Assessment/Plan    PT Assessment Patient needs continued PT services  PT Problem List Decreased strength;Decreased activity tolerance;Decreased balance;Decreased mobility;Decreased cognition;Decreased knowledge of use of DME;Decreased knowledge of precautions;Cardiopulmonary status limiting activity       PT Treatment Interventions DME instruction;Gait training;Functional mobility training;Therapeutic activities;Therapeutic exercise;Balance training;Patient/family education;Cognitive remediation;Neuromuscular re-education    PT Goals (Current goals can be found in the Care Plan section)  Acute Rehab PT Goals Patient Stated Goal: feel better PT Goal Formulation: With patient Time For Goal Achievement: 09/21/17 Potential to Achieve Goals: Fair    Frequency Min 2X/week   Barriers to discharge Decreased caregiver support lives alone    Co-evaluation               AM-PAC PT "6 Clicks" Daily Activity  Outcome Measure Difficulty turning over in bed (including adjusting bedclothes, sheets and blankets)?: A Little Difficulty moving from lying on back to sitting on the side  of the bed? : Unable Difficulty sitting down on and standing up from a chair with arms (e.g., wheelchair, bedside commode, etc,.)?: Unable Help needed moving to and from a bed to chair (including a wheelchair)?: A Lot Help needed walking in hospital room?: Total Help needed climbing 3-5 steps with a railing? : Total 6 Click Score: 9    End of Session Equipment Utilized During Treatment: Gait belt;Oxygen Activity Tolerance: Patient limited by fatigue Patient left: in chair;with call bell/phone within reach;with chair alarm set Nurse Communication: Mobility status PT Visit Diagnosis: Unsteadiness on feet (R26.81);Muscle weakness (generalized) (M62.81);Difficulty in walking, not elsewhere classified (R26.2)    Time: 7893-8101 PT Time Calculation (min) (ACUTE ONLY): 31 min   Charges:   PT Evaluation $PT Eval Moderate Complexity: 1 Mod PT Treatments $Therapeutic Activity: 8-22 mins   PT G Codes:        Leighton Roach, PT  Acute Rehab Services  [place] 09/07/2017, 4:03 PM

## 2017-09-07 NOTE — Evaluation (Signed)
Occupational Therapy Evaluation Patient Details Name: Wendy Flowers MRN: [id number] DOB: [birth year] Today's Date: 09/07/2017    History of Present Illness 84 y.o.femalewith medical history significant forknown pulmonary hypertension diagnosed by right heart cath/Croituro,chronic atrial fibrillation on warfarin, hypertension, hypothyroidism, systolic heart failure with restrictive physiology, osteoporosis with chronic compression fractures and dyslipidemia. Pt presenting after fall at home where she hit her head. Pt noted to be in afib with RVR. CT negative for acute findings, pt desating on RA in ED, chest xray revealed cardiomegaly with small pleural effusions, and multiple thoracic compression fractures.   Clinical Impression   Pt reports she was mod I with ADL PTA and was living alone. Currently pt overall min assist for stand pivot transfers, min assist for UB ADL in sitting, and max assist for LB ADL. For majority of session; HR 110-120s with jump up to 142 max but not sustained. Recommending SNF for follow up to maximize independence and safety with ADL and functional mobility prior to return home. Pt would benefit from continued skilled OT to address established goals.    Follow Up Recommendations  SNF;Supervision/Assistance - 24 hour    Equipment Recommendations  Other (comment)(TBD)    Recommendations for Other Services       Precautions / Restrictions Precautions Precautions: Fall Precaution Comments: watch HR Restrictions Weight Bearing Restrictions: No      Mobility Bed Mobility Overal bed mobility: Needs Assistance Bed Mobility: Supine to Sit     Supine to sit: Min assist;HOB elevated     General bed mobility comments: Assist to scoot hips around to EOB. Assisted due to urgency of urine.  Transfers Overall transfer level: Needs assistance Equipment used: 1 person hand held assist Transfers: Sit to/from [facility] Sit to Stand: Min  assist Stand pivot transfers: Min assist       General transfer comment: Min HHA for sit to stand from EOB x1, BSC x1. Cues for hand placement and safety. Min assist for pivot transfer x2 going to R side.    Balance Overall balance assessment: Needs assistance Sitting-balance support: Feet supported Sitting balance-Leahy Scale: Fair     Standing balance support: Bilateral upper extremity supported Standing balance-Leahy Scale: Poor                             ADL either performed or assessed with clinical judgement   ADL Overall ADL's : Needs assistance/impaired Eating/Feeding: Set up;Sitting   Grooming: Set up;Supervision/safety;Sitting   Upper Body Bathing: Minimal assistance;Sitting   Lower Body Bathing: Maximal assistance;Sit to/from stand   Upper Body Dressing : Minimal assistance;Sitting   Lower Body Dressing: Maximal assistance;Sit to/from stand   Toilet Transfer: Minimal assistance;Stand-pivot;BSC   Toileting- Water quality scientist and Hygiene: Min guard;Sitting/lateral lean Toileting - Clothing Manipulation Details (indicate cue type and reason): for peri care only     Functional mobility during ADLs: Minimal assistance(HHA for stand pivot only) General ADL Comments: HR up in 140s at times but not sustained; high 1-teens to 120s throughout session.     Vision         Perception     Praxis      Pertinent Vitals/Pain Pain Assessment: Faces Faces Pain Scale: Hurts little more Pain Location: abdomen +nausea Pain Descriptors / Indicators: Grimacing;Guarding Pain Intervention(s): Monitored during session;Limited activity within patient's tolerance;Repositioned     Hand Dominance Right   Extremity/Trunk Assessment Upper Extremity Assessment Upper Extremity Assessment: Generalized weakness  Lower Extremity Assessment Lower Extremity Assessment: Defer to PT evaluation   Cervical / Trunk Assessment Cervical / Trunk Assessment: Kyphotic    Communication Communication Communication: HOH   Cognition Arousal/Alertness: Awake/alert Behavior During Therapy: Flat affect Overall Cognitive Status: No family/caregiver present to determine baseline cognitive functioning                                 General Comments: Following simple directions well   General Comments       Exercises     Shoulder Instructions      Home Living Family/patient expects to be discharged to:: Private residence Living Arrangements: Alone Available Help at Discharge: Family;Available PRN/intermittently Type of Home: House Home Access: Stairs to enter [employer] of Steps: 3 at back entrance which is the entrance she uses  Entrance Stairs-Rails: Right;Left;Can reach both Home Layout: Two level;Laundry or work area in [employer] Shower/Tub: Tub/shower unit;Walk-in shower   Bathroom Toilet: Standard     Home Equipment: Cane - single point;Shower seat;Grab bars - tub/shower;Walker - 4 wheels          Prior Functioning/Environment Level of Independence: Independent with assistive device(s)        Comments: Pt reports she uses RW for mobility, someone comes in for cleaning her house, only sponge bathes recently        OT Problem List: Decreased strength;Decreased activity tolerance;Impaired balance (sitting and/or standing);Decreased cognition;Decreased safety awareness;Decreased knowledge of use of DME or AE;Cardiopulmonary status limiting activity;Pain      OT Treatment/Interventions: Self-care/ADL training;Therapeutic exercise;Energy conservation;DME and/or AE instruction;Therapeutic activities;Patient/family education;Balance training;Cognitive remediation/compensation    OT Goals(Current goals can be found in the care plan section) Acute Rehab OT Goals Patient Stated Goal: feel better OT Goal Formulation: With patient Time For Goal Achievement: 09/21/17 Potential to Achieve Goals: Good ADL  Goals Pt Will Perform Grooming: with min guard assist;standing Pt Will Perform Upper Body Bathing: with supervision;sitting Pt Will Perform Lower Body Bathing: with min guard assist;sit to/from stand Pt Will Transfer to Toilet: with min guard assist;ambulating;bedside commode Pt Will Perform Toileting - Clothing Manipulation and hygiene: with min guard assist;sit to/from stand  OT Frequency: Min 2X/week   Barriers to D/C: Decreased caregiver support  pt lives alone       Co-evaluation              AM-PAC PT "6 Clicks" Daily Activity     Outcome Measure Help from another person eating meals?: None Help from another person taking care of personal grooming?: A Little Help from another person toileting, which includes using toliet, bedpan, or urinal?: A Little Help from another person bathing (including washing, rinsing, drying)?: A Lot Help from another person to put on and taking off regular upper body clothing?: A Little Help from another person to put on and taking off regular lower body clothing?: A Lot 6 Click Score: 17   End of Session Equipment Utilized During Treatment: Oxygen Nurse Communication: Mobility status;Other (comment)(pt voided 650; RN tech notified)  Activity Tolerance: Patient tolerated treatment well Patient left: in chair;with call bell/phone within reach;with chair alarm set  OT Visit Diagnosis: Unsteadiness on feet (R26.81);Muscle weakness (generalized) (M62.81);Pain Pain - part of body: (abdomen)                Time: 4008-6761 OT Time Calculation (min): 19 min Charges:  OT General Charges $OT Visit: 1 Visit OT  Evaluation $OT Eval Moderate Complexity: 1 Mod G-Codes:     Emmalyn Hinson A. Ulice Brilliant, M.S., OTR/L Pager: [place] 09/07/2017, 1:42 PM

## 2017-09-07 NOTE — Progress Notes (Signed)
PROGRESS NOTE    Wendy Flowers  ELF:[id number] DOB: 05-Aug-1927 DOA: 09/04/2017 PCP: Deland Pretty, MD   Chief Complaint  Patient presents with  . Fall    Brief Narrative:  HPI on 09/04/2017 by Ms. Wendy Hearing, NP Wendy Flowers is a 84 y.o. female with medical history significant for known pulmonary hypertension diagnosed by right heart cath/Croituro, chronic atrial fibrillation on warfarin, hypertension, hypothyroidism, systolic heart failure with restrictive physiology, osteoporosis with chronic compression fractures and dyslipidemia.  Was brought to the ER via EMS after a fall at home.  It was reported that she accidentally pressed her life alert necklace and deputies responded to the home.  Patient told them she was fine but then fell into the doorway and hit her head on the side of the door.  She was also complaining of some back pain prior to the fall.  Patient was noted to be in atrial fibrillation with RVR with SBPs between 90 and 100.  After arrival to the ER it was noted her pulse oximetry was dropping to 86% on room air therefore nasal cannula oxygen was applied.  CT imaging of head and cervical spine unrevealing for traumatic injury.  Chest x-ray revealed cardiomegaly with small pleural effusions with left basilar atelectasis or pneumonia.  Given her presentation with hypoxemia and lower extremity edema as well as subtherapeutic INR CT angiography of the chest was completed to rule out PE.  No PE was detected but bilateral pleural effusions were confirmed as well as airspace infiltrates likely representing edema due to congestive heart failure.  In addition there was an incidental finding of multiple thoracic compression fractures consistent with osteoporosis with progression of the T3 fracture since previous study.  Patient's BNP was slightly elevated at 285 and she had a mild acute kidney injury.  She has been given Lasix 40 mg IV with marked increase in urinary output.  Assessment & Plan    Atrial fibrillation with RVR -CHADSVASC 5 -Continue Coumadin per pharmacy, INR 1.87 -cardiology consulted and appreciated, recommended lopressor 12.5mg  BID given hypotension -Rate better controlled today, patient was started on digoxin on 09/06/17  Acute respiratory failure with hypoxia -Likely multifactorial including systolic heart failure, pulmonary hypertension -Continue to treat heart failure and monitor -Continue supplemental oxygen to maintain saturations above 92%  Acute on chronic systolic heart failure  -BNP 285.3 -presented with lower extremity edema -Echocardiogram in 2015: EF 45-50% -Echocardiogram EF 45-50%, no RWMA -cardiology not recommending diuresis at this time given low BP -patient's blood pressure improved, with mild LE edema, will give one dose of IV lasix 20mg  today -continue to monitor intake/output daily weights  Acute kidney injury -Baseline creatinine approximate 0.7  Hyperkalemia -likely due to potassium supplementation, will hold -will give one dose of IV lasix and monitor BMP  Hypotension -possibly secondary to AF/beta blocker, diuresis -Resolved, BP better today -per cardiology, outpatient SBP is around 100  -Continue to monitor   Fall -PT/OT consulted  Thoracic compression fracture -Unknown at this is acute versus subacute -Interventional radiology consulted and appreciated, patient deemed not to be candidate for vertebral augmentation -Continue pain control -As above, PT OT consulted  Dementia -Not on medications prior to admission -Discussed with patient's nephew at bedside who is also her power of attorney -likely should not be living at home alone  Hypothyroidism -Continue Synthroid -TSH 2.545  Dyslipidemia -Continue statin  Goals of care -discussed with nephew at bedside, he would like to discuss her code status with the rest  of the family -Dicussed with several family members (09/06/17), transitioned patient to  DNR  Severe protein calorie malnutrition -nutrition consulted, continue supplements  DVT Prophylaxis  Coumadin  Code Status: DNR  Family Communication: None at bedside  Disposition Plan: Admitted. Suspect patient will need SNF at discharge when medically stable  Consultants Cardiology Interventional radiology  Procedures  Echocardiogram  Antibiotics   Anti-infectives (From admission, onward)   None      Subjective:   Wendy Flowers seen and examined today.  Dementia. No complaints this morning. Denies chest pain, shortness of breath, abdominal pain, N/V, headache, dizziness.   Objective:   Vitals:   09/06/17 1414 09/06/17 2100 09/06/17 2136 09/07/17 0542  BP: 110/71 125/80  140/85  Pulse: (!) 113 (!) 122 (!) 119 98  Resp: (!) 27 (!) 26  (!) 27  Temp: 97.7 F (36.5 C) 98.8 F (37.1 C)  98.5 F (36.9 C)  TempSrc: Oral Oral  Oral  SpO2: 94% 96%  95%  Weight:    53.9 kg (118 lb 14.4 oz)  Height:        Intake/Output Summary (Last 24 hours) at 09/07/2017 0956 Last data filed at 09/07/2017 0648 Gross per 24 hour  Intake 240 ml  Output 200 ml  Net 40 ml   Filed Weights   09/05/17 0414 09/06/17 0425 09/07/17 0542  Weight: 50.7 kg (111 lb 12.4 oz) 52.3 kg (115 lb 4.8 oz) 53.9 kg (118 lb 14.4 oz)    Exam   General: Well developed, chronically ill appearing, frail, NAD  HEENT: NCAT, mucous membranes moist.   Cardiovascular: S1 S2 auscultated, irregularly irregular- rate better controlled today  Respiratory: Clear to auscultation bilaterally, no wheezing  Abdomen: Soft, nontender, nondistended, + bowel sounds  Extremities: warm dry without cyanosis clubbing. +LE edema-improving   Neuro: AAOx2, nonfocal, dementia  Psych: pleasant an appropriate   Data Reviewed: I have personally reviewed following labs and imaging studies  CBC: Recent Labs  Lab 09/04/17 0102 09/05/17 0518 09/06/17 0449 09/07/17 0722  WBC 9.2 7.1 8.5 7.8  HGB 14.5 12.2 12.7 13.0   HCT 44.8 38.8 40.3 41.8  MCV 87.2 89.0 88.0 88.9  PLT 262 201 202 258   Basic Metabolic Panel: Recent Labs  Lab 09/04/17 0102 09/05/17 0518 09/06/17 0449 09/07/17 0722  NA 135 138 136 135  K 4.2 3.1* 5.2* 5.5*  CL 94* 98* 98* 97*  CO2 29 30 29 30   GLUCOSE 140* 100* 134* 113*  BUN 24* 15 21* 18  CREATININE 1.14* 0.78 0.73 0.72  CALCIUM 9.8 8.2* 9.6 9.7   GFR: Estimated Creatinine Clearance: 34.9 mL/min (by C-G formula based on SCr of 0.72 mg/dL). Liver Function Tests: Recent Labs  Lab 09/06/17 0449  AST 22  ALT 17  ALKPHOS 97  BILITOT 1.0  PROT 5.3*  ALBUMIN 2.8*   No results for input(s): LIPASE, AMYLASE in the last 168 hours. No results for input(s): AMMONIA in the last 168 hours. Coagulation Profile: Recent Labs  Lab 09/04/17 0102 09/05/17 0518 09/06/17 0449 09/07/17 0722  INR 1.88 2.80 2.78 1.87   Cardiac Enzymes: Recent Labs  Lab 09/04/17 0102  TROPONINI <0.03   BNP (last 3 results) No results for input(s): PROBNP in the last 8760 hours. HbA1C: No results for input(s): HGBA1C in the last 72 hours. CBG: Recent Labs  Lab 09/05/17 1156  GLUCAP 124*   Lipid Profile: Recent Labs    09/05/17 0518  CHOL 113  HDL 39*  LDLCALC 64  TRIG 49  CHOLHDL 2.9   Thyroid Function Tests: No results for input(s): TSH, T4TOTAL, FREET4, T3FREE, THYROIDAB in the last 72 hours. Anemia Panel: No results for input(s): VITAMINB12, FOLATE, FERRITIN, TIBC, IRON, RETICCTPCT in the last 72 hours. Urine analysis:    Component Value Date/Time   COLORURINE STRAW (A) 09/04/2017 0759   APPEARANCEUR CLEAR 09/04/2017 0759   LABSPEC 1.011 09/04/2017 0759   PHURINE 7.0 09/04/2017 0759   GLUCOSEU NEGATIVE 09/04/2017 0759   HGBUR NEGATIVE 09/04/2017 0759   BILIRUBINUR NEGATIVE 09/04/2017 0759   KETONESUR NEGATIVE 09/04/2017 0759   PROTEINUR NEGATIVE 09/04/2017 0759   UROBILINOGEN 1.0 02/05/2014 1258   NITRITE NEGATIVE 09/04/2017 0759   LEUKOCYTESUR NEGATIVE  09/04/2017 0759   Sepsis Labs: @LABRCNTIP (procalcitonin:4,lacticidven:4)  ) Recent Results (from the past 240 hour(s))  Culture, Urine     Status: None   Collection Time: 09/04/17  7:59 AM  Result Value Ref Range Status   Specimen Description URINE, CLEAN CATCH  Final   Special Requests NONE  Final   Culture NO GROWTH  Final   Report Status 09/05/2017 FINAL  Final  Respiratory Panel by PCR     Status: None   Collection Time: 09/04/17  8:11 AM  Result Value Ref Range Status   Adenovirus NOT DETECTED NOT DETECTED Final   Coronavirus 229E NOT DETECTED NOT DETECTED Final   Coronavirus HKU1 NOT DETECTED NOT DETECTED Final   Coronavirus NL63 NOT DETECTED NOT DETECTED Final   Coronavirus OC43 NOT DETECTED NOT DETECTED Final   Metapneumovirus NOT DETECTED NOT DETECTED Final   Rhinovirus / Enterovirus NOT DETECTED NOT DETECTED Final   Influenza A NOT DETECTED NOT DETECTED Final   Influenza B NOT DETECTED NOT DETECTED Final   Parainfluenza Virus 1 NOT DETECTED NOT DETECTED Final   Parainfluenza Virus 2 NOT DETECTED NOT DETECTED Final   Parainfluenza Virus 3 NOT DETECTED NOT DETECTED Final   Parainfluenza Virus 4 NOT DETECTED NOT DETECTED Final   Respiratory Syncytial Virus NOT DETECTED NOT DETECTED Final   Bordetella pertussis NOT DETECTED NOT DETECTED Final   Chlamydophila pneumoniae NOT DETECTED NOT DETECTED Final   Mycoplasma pneumoniae NOT DETECTED NOT DETECTED Final      Radiology Studies: No results found.   Scheduled Meds: . calcium-vitamin D  1 tablet Oral BID  . collagenase   Topical Daily  . digoxin  0.125 mg Oral Daily  . escitalopram  10 mg Oral Daily  . feeding supplement (ENSURE ENLIVE)  237 mL Oral TID BM  . furosemide  20 mg Intravenous Once  . levothyroxine  137 mcg Oral QAC breakfast  . metoprolol tartrate  25 mg Oral BID  . multivitamin with minerals  1 tablet Oral Daily  . potassium chloride  40 mEq Oral BID  . simvastatin  40 mg Oral q1800  . warfarin   2 mg Oral ONCE-1800  . Warfarin - Pharmacist Dosing Inpatient   Does not apply q1800   Continuous Infusions: . methocarbamol (ROBAXIN)  IV       LOS: 3 days   Time Spent in minutes   30 minutes  Wendy Flowers D.O. on 09/07/2017 at 9:56 AM  Between 7am to 7pm - Pager - [phone]  After 7pm go to [email or web address] - password [email or web address]  And look for the night coverage person covering for me after hours  [facility] Office  [phone]

## 2017-09-07 NOTE — Progress Notes (Signed)
ANTICOAGULATION CONSULT NOTE - Follow Up Consult  Pharmacy Consult for Coumadin Indication: atrial fibrillation  Allergies  Allergen Reactions  . Foradil [Formoterol] Other (See Comments)    Reaction not recalled (??)    Patient Measurements: Height: 5' (152.4 cm) Weight: 118 lb 14.4 oz (53.9 kg) IBW/kg (Calculated) : 45.5  Vital Signs: Temp: 98.5 F (36.9 C) (01/27 0542) Temp Source: Oral (01/27 0542) BP: 140/85 (01/27 0542) Pulse Rate: 98 (01/27 0542)  Labs: Recent Labs    09/05/17 0518 09/06/17 0449 09/07/17 0722  HGB 12.2 12.7 13.0  HCT 38.8 40.3 41.8  PLT 201 202 196  LABPROT 29.3* 29.1* 21.4*  INR 2.80 2.78 1.87  CREATININE 0.78 0.73  --     Estimated Creatinine Clearance: 34.9 mL/min (by C-G formula based on SCr of 0.73 mg/dL).  Assessment: 84 yr old female continues on Coumadin as prior to admission for atrial fibrillation. Hx falls prior to admission, head CT negative for bleed. CTA negative for PE. Multiple thoracic compression fractures, but no procedures planned.  INR is therapeutic today (2.78), after holding dose last night.  Home Coumadin regimen: 2 mg daily.  1/24 PM 3 mg x1 > INR went from 1.14 to 2.80 1/25 dose held > INR 2.78 1/26 0.5 mg x 1 > INR 1.87  Goal of Therapy:  INR 2-3 Monitor platelets by anticoagulation protocol: Yes   Plan:  2 mg warfarin x 1 tonight Daily PT/INR.  [employer], [place] 09/07/2017,8:58 AM

## 2017-09-07 NOTE — Progress Notes (Signed)
Progress Note  Patient Name: Wendy Flowers Date of Encounter: 09/07/2017  Primary Cardiologist: No primary care provider on file.   Subjective   Appears comfortable, smiling today.  Inpatient Medications    Scheduled Meds: . calcium-vitamin D  1 tablet Oral BID  . collagenase   Topical Daily  . digoxin  0.125 mg Oral Daily  . escitalopram  10 mg Oral Daily  . feeding supplement (ENSURE ENLIVE)  237 mL Oral TID BM  . furosemide  20 mg Intravenous Once  . levothyroxine  137 mcg Oral QAC breakfast  . metoprolol tartrate  37.5 mg Oral BID  . multivitamin with minerals  1 tablet Oral Daily  . simvastatin  40 mg Oral q1800  . warfarin  2 mg Oral ONCE-1800  . Warfarin - Pharmacist Dosing Inpatient   Does not apply q1800   Continuous Infusions: . methocarbamol (ROBAXIN)  IV     PRN Meds: acetaminophen, methocarbamol (ROBAXIN)  IV, metoprolol tartrate, ondansetron (ZOFRAN) IV, traMADol   Vital Signs    Vitals:   09/06/17 1414 09/06/17 2100 09/06/17 2136 09/07/17 0542  BP: 110/71 125/80  140/85  Pulse: (!) 113 (!) 122 (!) 119 98  Resp: (!) 27 (!) 26  (!) 27  Temp: 97.7 F (36.5 C) 98.8 F (37.1 C)  98.5 F (36.9 C)  TempSrc: Oral Oral  Oral  SpO2: 94% 96%  95%  Weight:    118 lb 14.4 oz (53.9 kg)  Height:        Intake/Output Summary (Last 24 hours) at 09/07/2017 1007 Last data filed at 09/07/2017 9024 Gross per 24 hour  Intake 240 ml  Output 200 ml  Net 40 ml   Filed Weights   09/05/17 0414 09/06/17 0425 09/07/17 0542  Weight: 111 lb 12.4 oz (50.7 kg) 115 lb 4.8 oz (52.3 kg) 118 lb 14.4 oz (53.9 kg)    Telemetry    Trial fibrillation with rapid ventricular response, rates 105-1:15 at rest - Personally Reviewed  ECG    No new tracing - Personally Reviewed  Physical Exam  Appears comfortable, sitting upright eating breakfast. Appears old and frail and malnourished GEN: No acute distress.   Neck: 5-6 cm JVD Cardiac: irregular and tachycardic, no murmurs,  rubs, or gallops.  Respiratory: Clear to auscultation bilaterally. GI: Soft, nontender, non-distended  MS: mild bilateral ankle and pedal edema; No deformity. Neuro:  Nonfocal  Psych: Normal affect   Labs    Chemistry Recent Labs  Lab 09/05/17 0518 09/06/17 0449 09/07/17 0722  NA 138 136 135  K 3.1* 5.2* 5.5*  CL 98* 98* 97*  CO2 30 29 30   GLUCOSE 100* 134* 113*  BUN 15 21* 18  CREATININE 0.78 0.73 0.72  CALCIUM 8.2* 9.6 9.7  PROT  --  5.3*  --   ALBUMIN  --  2.8*  --   AST  --  22  --   ALT  --  17  --   ALKPHOS  --  97  --   BILITOT  --  1.0  --   GFRNONAA >60 >60 >60  GFRAA >60 >60 >60  ANIONGAP 10 9 8      Hematology Recent Labs  Lab 09/05/17 0518 09/06/17 0449 09/07/17 0722  WBC 7.1 8.5 7.8  RBC 4.36 4.58 4.70  HGB 12.2 12.7 13.0  HCT 38.8 40.3 41.8  MCV 89.0 88.0 88.9  MCH 28.0 27.7 27.7  MCHC 31.4 31.5 31.1  RDW 15.7* 15.4 15.4  PLT 201 202 196    Cardiac Enzymes Recent Labs  Lab 09/04/17 0102  TROPONINI <0.03   No results for input(s): TROPIPOC in the last 168 hours.   BNP Recent Labs  Lab 09/04/17 0102  BNP 285.3*     DDimer No results for input(s): DDIMER in the last 168 hours.   Radiology    No results found.  Cardiac Studies   Echo Sep 04 2017 Study Conclusions  - Left ventricle: The cavity size was normal. Systolic function was mildly reduced. The estimated ejection fraction was in the range of 45% to 50%. Wall motion was normal; there were no regional wall motion abnormalities. - Ventricular septum: The contour showed systolic flattening. These changes are consistent with RV pressure overload. - Mitral valve: Mildly calcified, moderately fibrotic annulus. There was moderate regurgitation. - Left atrium: The atrium was moderately dilated. - Right ventricle: The cavity size was moderately dilated. Wall thickness was normal. Systolic function was reduced. - Right atrium: The atrium was severely dilated. -  Tricuspid valve: There was moderate regurgitation. - Pulmonary arteries: Systolic pressure was moderately increased. PA peak pressure: 60 mm Hg (S).  Patient Profile     84 y.o. female  with long-standing history of permanent atrial fibrillation and moderate pulmonary hypertension, admitted after a fall at home for atrial fibrillation rapid ventricular response  Assessment & Plan    1. AFib: rate control is better, but still far from ideal. Blood pressures are higher today, will increase beta blocker. Continue digoxin.herapeutically anticoagulated 2. PAH: she has moderate pulmonary hypertension which previous right catheterization has shown to be secondary to primary pulmonary arteriolar obstruction, not due to left heart failure. The exact cause was never established, but the abnormality appeared to be stable in severity. The most likely cause was felt to be restrictive lung disease from thoracic kyphoscoliosis. The echo performed on this admission shows only a mild worsening of PAH compared to previous evaluations 2011-2015. Only has mild evidence of elevated right heart filling pressures on exam today. 3. CMP: mildly depressed LVEF, stable compared with previous evaluations. Consider infiltrative cardiomyopathy in view of severe biatrial enlargement. She does not have angina pectoris. There is no indication for invasive evaluation for CAD. 4. Cognitive decline/dementia: raises the possibility that she has been less than compliant with her medications, but her INR was close to therapeutic range on admission.    For questions or updates, please contact [place] Please consult [email or web address] for contact info under Cardiology/STEMI.      Signed, Sanda Klein, MD  09/07/2017, 10:07 AM

## 2017-09-08 DIAGNOSIS — L8990 Pressure ulcer of unspecified site, unspecified stage: Secondary | ICD-10-CM

## 2017-09-08 DIAGNOSIS — N179 Acute kidney failure, unspecified: Secondary | ICD-10-CM

## 2017-09-08 DIAGNOSIS — J810 Acute pulmonary edema: Secondary | ICD-10-CM

## 2017-09-08 DIAGNOSIS — E785 Hyperlipidemia, unspecified: Secondary | ICD-10-CM

## 2017-09-08 DIAGNOSIS — S22000A Wedge compression fracture of unspecified thoracic vertebra, initial encounter for closed fracture: Secondary | ICD-10-CM

## 2017-09-08 DIAGNOSIS — E039 Hypothyroidism, unspecified: Secondary | ICD-10-CM

## 2017-09-08 DIAGNOSIS — J9601 Acute respiratory failure with hypoxia: Secondary | ICD-10-CM

## 2017-09-08 DIAGNOSIS — F0390 Unspecified dementia without behavioral disturbance: Secondary | ICD-10-CM

## 2017-09-08 DIAGNOSIS — R0902 Hypoxemia: Secondary | ICD-10-CM

## 2017-09-08 LAB — BASIC METABOLIC PANEL
ANION GAP: 10 (ref 5–15)
BUN: 16 mg/dL (ref 6–20)
CALCIUM: 9.3 mg/dL (ref 8.9–10.3)
CHLORIDE: 96 mmol/L — AB (ref 101–111)
CO2: 28 mmol/L (ref 22–32)
Creatinine, Ser: 0.56 mg/dL (ref 0.44–1.00)
GFR calc non Af Amer: >60 mL/min (ref >60)
GLUCOSE: 108 mg/dL — AB (ref 65–99)
Potassium: 4.7 mmol/L (ref 3.5–5.1)
Sodium: 134 mmol/L — ABNORMAL LOW (ref 135–145)

## 2017-09-08 LAB — PROTIME-INR
INR: 1.54
PROTHROMBIN TIME: 18.3 s — AB (ref 11.4–15.2)

## 2017-09-08 MED ORDER — METOPROLOL SUCCINATE ER 100 MG PO TB24
100.0000 mg | ORAL_TABLET | Freq: Every day | ORAL | Status: DC
  Administered 2017-09-09: 100 mg via ORAL
  Filled 2017-09-08: qty 1

## 2017-09-08 MED ORDER — WARFARIN SODIUM 2.5 MG PO TABS
2.5000 mg | ORAL_TABLET | Freq: Once | ORAL | Status: AC
  Administered 2017-09-08: 2.5 mg via ORAL
  Filled 2017-09-08: qty 1

## 2017-09-08 NOTE — NC FL2 (Signed)
[place] MEDICAID FL2 LEVEL OF CARE SCREENING TOOL     IDENTIFICATION  Patient Name: Wendy Flowers Birthdate: September 19, 1926 Sex: female Admission Date (Current Location): 09/04/2017  [facility] and [place] Number:  Herbalist and Address:  [facility] [address] [place], [place] [address]      Provider Number: [id number]  Attending Physician Name and Address:  Cristal Ford, DO  Relative Name and Phone Number:  [name], nephew, [phone]    Current Level of Care: Hospital Recommended Level of Care: [place] Prior Approval Number:    Date Approved/Denied:   PASRR Number: [id number]  Discharge Plan: SNF    Current Diagnoses: Patient Active Problem List   Diagnosis Date Noted  . Protein-calorie malnutrition, severe 09/07/2017  . Pressure injury of skin 09/05/2017  . Atrial fibrillation with RVR ([place]) 09/04/2017  . Acute respiratory failure with hypoxia ([place]) 09/04/2017  . Fall at home 09/04/2017  . Thoracic compression fracture ([place]) 09/04/2017  . Acute on chronic systolic heart failure ([place]) 09/04/2017  . Acute kidney injury ([place]) 09/04/2017  . Hypothyroidism, adult 09/04/2017  . Dyslipidemia 09/04/2017  . Dementia 09/04/2017  . NICM (nonischemic cardiomyopathy) ([place]) 06/27/2017  . Age-related osteoporosis with current pathological fracture 06/01/2017  . Hypothyroidism 05/14/2017  . Fall 05/13/2017  . Supratherapeutic INR 05/13/2017  . Meningioma ([place]) 05/13/2017  . Chronic systolic CHF (congestive heart failure) ([place]) 05/13/2017  . HLD (hyperlipidemia) 05/13/2017  . Compression fracture of lumbar vertebra ([place]) 05/13/2017  . Restrictive lung disease due to kyphoscoliosis 06/02/2014  . Chronic anticoagulation 02/28/2014  . Kyphosis 02/28/2014  . Memory changes 05/15/2013  . Essential hypertension 03/22/2010  . Pulmonary arterial hypertension ([place]) 03/22/2010  . Permanent atrial fibrillation ([place])  03/21/2010    Orientation RESPIRATION BLADDER Height & Weight     Self  O2(nasal cannula 2L) Incontinent Weight: 119 lb (54 kg) Height:  5' (152.4 cm)  BEHAVIORAL SYMPTOMS/MOOD NEUROLOGICAL BOWEL NUTRITION STATUS      Continent Diet(please see DC summary)  AMBULATORY STATUS COMMUNICATION OF NEEDS Skin   Extensive Assist Verbally PU Stage and Appropriate Care(PU unstageable buttocks, foam dressing; PU stage III sacrum, foam dressing)                       Personal Care Assistance Level of Assistance  Bathing, Feeding, Dressing Bathing Assistance: Limited assistance Feeding assistance: Independent Dressing Assistance: Limited assistance     Functional Limitations Info  Sight, Hearing, Speech Sight Info: Adequate Hearing Info: Adequate Speech Info: Adequate    SPECIAL CARE FACTORS FREQUENCY  PT (By licensed PT), OT (By licensed OT)     PT Frequency: 5x/week OT Frequency: 5x/week            Contractures Contractures Info: Not present    Additional Factors Info  Code Status, Allergies Code Status Info: DNR Allergies Info: Foradil Formoterol           Current Medications (09/08/2017):  This is the current hospital active medication list Current Facility-Administered Medications  Medication Dose Route Frequency Provider Last Rate Last Dose  . acetaminophen (TYLENOL) tablet 650 mg  650 mg Oral Q4H PRN Samella Parr, NP      . calcium-vitamin D (OSCAL WITH D) 500-200 MG-UNIT per tablet 1 tablet  1 tablet Oral BID Samella Parr, NP   1 tablet at 09/07/17 2119  . collagenase (SANTYL) ointment   Topical Daily Lady Deutscher, MD      .  digoxin (LANOXIN) tablet 0.125 mg  0.125 mg Oral Daily Croitoru, Mihai, MD   0.125 mg at 09/07/17 1015  . escitalopram (LEXAPRO) tablet 10 mg  10 mg Oral Daily Cristal Ford, DO   10 mg at 09/07/17 1015  . feeding supplement (ENSURE ENLIVE) (ENSURE ENLIVE) liquid 237 mL  237 mL Oral TID BM Mikhail, Velta Addison, DO   237 mL at  09/07/17 1924  . levothyroxine (SYNTHROID, LEVOTHROID) tablet 137 mcg  137 mcg Oral QAC breakfast Samella Parr, NP   137 mcg at 09/07/17 1014  . methocarbamol (ROBAXIN) 500 mg in dextrose 5 % 50 mL IVPB  500 mg Intravenous Q6H PRN Samella Parr, NP      . metoprolol tartrate (LOPRESSOR) injection 2.5 mg  2.5 mg Intravenous QID PRN Bhagat, Bhavinkumar, PA   2.5 mg at 09/05/17 1932  . metoprolol tartrate (LOPRESSOR) tablet 37.5 mg  37.5 mg Oral BID Croitoru, Mihai, MD   37.5 mg at 09/07/17 2119  . multivitamin with minerals tablet 1 tablet  1 tablet Oral Daily Cristal Ford, DO   1 tablet at 09/07/17 1014  . ondansetron (ZOFRAN) injection 4 mg  4 mg Intravenous Q6H PRN Samella Parr, NP      . simvastatin (ZOCOR) tablet 40 mg  40 mg Oral q1800 Samella Parr, NP   40 mg at 09/07/17 1819  . traMADol (ULTRAM) tablet 50 mg  50 mg Oral Q6H PRN Samella Parr, NP      . Warfarin - Pharmacist Dosing Inpatient   Does not apply q1800 Yopp, Amber C, [place] at 09/05/17 1800     Discharge Medications: Please see discharge summary for a list of discharge medications.  Relevant Imaging Results:  Relevant Lab Results:   Additional Information SNN: [id number]  Estanislado Emms, LCSW

## 2017-09-08 NOTE — Progress Notes (Signed)
Progress Note  Patient Name: Wendy Flowers Date of Encounter: 09/08/2017  Primary Cardiologist: No primary care provider on file.   Subjective   Rested well. A little disoriented in the morning. Rate control has improved, 95-110.  BP tolerating increased beta blocker dose.  Inpatient Medications    Scheduled Meds: . calcium-vitamin D  1 tablet Oral BID  . collagenase   Topical Daily  . digoxin  0.125 mg Oral Daily  . escitalopram  10 mg Oral Daily  . feeding supplement (ENSURE ENLIVE)  237 mL Oral TID BM  . levothyroxine  137 mcg Oral QAC breakfast  . metoprolol tartrate  37.5 mg Oral BID  . multivitamin with minerals  1 tablet Oral Daily  . simvastatin  40 mg Oral q1800  . Warfarin - Pharmacist Dosing Inpatient   Does not apply q1800   Continuous Infusions: . methocarbamol (ROBAXIN)  IV     PRN Meds: acetaminophen, methocarbamol (ROBAXIN)  IV, metoprolol tartrate, ondansetron (ZOFRAN) IV, traMADol   Vital Signs    Vitals:   09/07/17 1819 09/07/17 2055 09/07/17 2119 09/08/17 0643  BP: 130/77 134/88  124/86  Pulse:  93 (!) 105 (!) 102  Resp: (!) 28 (!) 27  (!) 29  Temp:  98 F (36.7 C)  98 F (36.7 C)  TempSrc:  Oral  Oral  SpO2:  94%  92%  Weight:    119 lb (54 kg)  Height:        Intake/Output Summary (Last 24 hours) at 09/08/2017 0916 Last data filed at 09/08/2017 0644 Gross per 24 hour  Intake 480 ml  Output 750 ml  Net -270 ml   Filed Weights   09/06/17 0425 09/07/17 0542 09/08/17 0643  Weight: 115 lb 4.8 oz (52.3 kg) 118 lb 14.4 oz (53.9 kg) 119 lb (54 kg)    Telemetry    AF with borderline rate control - Personally Reviewed  ECG    No new tracing - Personally Reviewed  Physical Exam  Comfortable, underweight, frail GEN: No acute distress.   Neck: 3-5 cm JVD Cardiac: irregular, no murmurs, rubs, or gallops.  Respiratory: Clear to auscultation bilaterally. GI: Soft, nontender, non-distended  MS: No edema; No deformity. Neuro:  Nonfocal    Psych: Normal affect   Labs    Chemistry Recent Labs  Lab 09/05/17 0518 09/06/17 0449 09/07/17 0722  NA 138 136 135  K 3.1* 5.2* 5.5*  CL 98* 98* 97*  CO2 30 29 30   GLUCOSE 100* 134* 113*  BUN 15 21* 18  CREATININE 0.78 0.73 0.72  CALCIUM 8.2* 9.6 9.7  PROT  --  5.3*  --   ALBUMIN  --  2.8*  --   AST  --  22  --   ALT  --  17  --   ALKPHOS  --  97  --   BILITOT  --  1.0  --   GFRNONAA >60 >60 >60  GFRAA >60 >60 >60  ANIONGAP 10 9 8      Hematology Recent Labs  Lab 09/05/17 0518 09/06/17 0449 09/07/17 0722  WBC 7.1 8.5 7.8  RBC 4.36 4.58 4.70  HGB 12.2 12.7 13.0  HCT 38.8 40.3 41.8  MCV 89.0 88.0 88.9  MCH 28.0 27.7 27.7  MCHC 31.4 31.5 31.1  RDW 15.7* 15.4 15.4  PLT 201 202 196    Cardiac Enzymes Recent Labs  Lab 09/04/17 0102  TROPONINI <0.03   No results for input(s): TROPIPOC in the  last 168 hours.   BNP Recent Labs  Lab 09/04/17 0102  BNP 285.3*     DDimer No results for input(s): DDIMER in the last 168 hours.   Radiology    No results found.  Cardiac Studies   Echo Sep 04 2017 Study Conclusions  - Left ventricle: The cavity size was normal. Systolic function was mildly reduced. The estimated ejection fraction was in the range of 45% to 50%. Wall motion was normal; there were no regional wall motion abnormalities. - Ventricular septum: The contour showed systolic flattening. These changes are consistent with RV pressure overload. - Mitral valve: Mildly calcified, moderately fibrotic annulus. There was moderate regurgitation. - Left atrium: The atrium was moderately dilated. - Right ventricle: The cavity size was moderately dilated. Wall thickness was normal. Systolic function was reduced. - Right atrium: The atrium was severely dilated. - Tricuspid valve: There was moderate regurgitation. - Pulmonary arteries: Systolic pressure was moderately increased. PA peak pressure: 60 mm Hg (S).    Patient Profile      84 y.o. female with long-standing history of permanent atrial fibrillation and moderate pulmonary hypertension, admitted after a fall at home for atrial fibrillation rapid ventricular response  Assessment & Plan    1. AFib: rate control improving. Will switch to once daily metoprolol succinate 100 mg daily, to allow better likelihood of compliance. Continue digoxin. Therapeutically anticoagulated. 2. PAH: she has moderate pulmonary hypertension which previous right catheterization has shown to be secondary to primary pulmonary arteriolar obstruction, not due to left heart failure. The exact cause was never established, but the abnormality appeared to be stable in severity. The most likely cause was felt to be restrictive lung disease from thoracic kyphoscoliosis. The echo performed on this admission shows only a mild worsening of PAH compared to previous evaluations 2011-2015. Little evidence of elevated right heart filling pressures on exam today. No plans for additional investigations at this time. 3. CMP: mildly depressed LVEF, stable compared with previous evaluations. Consider infiltrative cardiomyopathy in view of severe biatrial enlargement. She does not have angina pectoris. There is no indication for invasive evaluation for CAD. 4. Cognitive decline/dementia:raises the possibility that she has been less than compliant with her medications, but her INR was close to therapeutic range on admission.    For questions or updates, please contact [place] Please consult [email or web address] for contact info under Cardiology/STEMI.      Signed, Sanda Klein, MD  09/08/2017, 9:16 AM

## 2017-09-08 NOTE — Progress Notes (Addendum)
PROGRESS NOTE    Wendy Flowers  IRJ:[id number] DOB: 24-Sep-1926 DOA: 09/04/2017 PCP: Deland Pretty, MD   Chief Complaint  Patient presents with  . Fall    Brief Narrative:  HPI on 09/04/2017 by [name], NP Wendy Flowers is a 84 y.o. female with medical history significant for known pulmonary hypertension diagnosed by right heart cath/Croituro, chronic atrial fibrillation on warfarin, hypertension, hypothyroidism, systolic heart failure with restrictive physiology, osteoporosis with chronic compression fractures and dyslipidemia.  Was brought to the ER via EMS after a fall at home.  It was reported that she accidentally pressed her life alert necklace and deputies responded to the home.  Patient told them she was fine but then fell into the doorway and hit her head on the side of the door.  She was also complaining of some back pain prior to the fall.  Patient was noted to be in atrial fibrillation with RVR with SBPs between 90 and 100.  After arrival to the ER it was noted her pulse oximetry was dropping to 86% on room air therefore nasal cannula oxygen was applied.  CT imaging of head and cervical spine unrevealing for traumatic injury.  Chest x-ray revealed cardiomegaly with small pleural effusions with left basilar atelectasis or pneumonia.  Given her presentation with hypoxemia and lower extremity edema as well as subtherapeutic INR CT angiography of the chest was completed to rule out PE.  No PE was detected but bilateral pleural effusions were confirmed as well as airspace infiltrates likely representing edema due to congestive heart failure.  In addition there was an incidental finding of multiple thoracic compression fractures consistent with osteoporosis with progression of the T3 fracture since previous study.  Patient's BNP was slightly elevated at 285 and she had a mild acute kidney injury.  She has been given Lasix 40 mg IV with marked increase in urinary output.  Assessment & Plan    Atrial fibrillation with RVR -CHADSVASC 5 -Continue Coumadin per pharmacy, INR 1.54 -cardiology consulted and appreciated -Rate better controlled today, patient was started on digoxin on 09/06/17 -transitioned to metoprolol succinate 100mg  daily   Acute respiratory failure with hypoxia -Likely multifactorial including systolic heart failure, pulmonary hypertension -Continue to treat heart failure and monitor -Continue supplemental oxygen to maintain saturations above 92%  Acute on chronic systolic heart failure/PAH -BNP 285.3 -presented with lower extremity edema -Echocardiogram in 2015: EF 45-50% -Echocardiogram EF 45-50%, no RWMA -cardiology not recommending diuresis at this time given low BP -patient's blood pressure improved, with mild LE edema, will give one dose of IV lasix 20mg  today -continue to monitor intake/output daily weights  Acute kidney injury -Baseline creatinine approximate 0.7 -resolved, creatinine 0.56  Hyperkalemia -likely due to potassium supplementation, will hold -resolved, continue to monitor BMP  Hypotension -possibly secondary to AF/beta blocker, diuresis -Resolved, BP better today -per cardiology, outpatient SBP is around 100  -Continue to monitor   Fall -PT/OT consulted- recommend SNF  Thoracic compression fracture -Unknown at this is acute versus subacute -Interventional radiology consulted and appreciated, patient deemed not to be candidate for vertebral augmentation -Continue pain control -As above, PT OT consulted  Dementia -Not on medications prior to admission -Discussed with patient's nephew at bedside who is also her power of attorney -likely should not be living at home alone  Hypothyroidism -Continue Synthroid -TSH 2.545  Dyslipidemia -Continue statin  Goals of care -discussed with nephew at bedside, he would like to discuss her code status with the rest  of the family -Dicussed with several family members (09/06/17),  transitioned patient to DNR  Severe protein calorie malnutrition -nutrition consulted, continue supplements  DVT Prophylaxis  Coumadin  Code Status: DNR  Family Communication: None at bedside  Disposition Plan: Admitted. Suspect patient will need SNF at discharge when medically stable  Consultants Cardiology Interventional radiology  Procedures  Echocardiogram  Antibiotics   Anti-infectives (From admission, onward)   None      Subjective:   Wendy Flowers seen and examined today.  Dementia. No complaints today. Denies chest pain or shortness of breath, pain.  Objective:   Vitals:   09/07/17 2055 09/07/17 2119 09/08/17 0643 09/08/17 0900  BP: 134/88  124/86   Pulse: 93 (!) 105 (!) 102   Resp: (!) 27  (!) 29 (!) 21  Temp: 98 F (36.7 C)  98 F (36.7 C)   TempSrc: Oral  Oral   SpO2: 94%  92%   Weight:   54 kg (119 lb)   Height:        Intake/Output Summary (Last 24 hours) at 09/08/2017 1007 Last data filed at 09/08/2017 0900 Gross per 24 hour  Intake 720 ml  Output 750 ml  Net -30 ml   Filed Weights   09/06/17 0425 09/07/17 0542 09/08/17 0643  Weight: 52.3 kg (115 lb 4.8 oz) 53.9 kg (118 lb 14.4 oz) 54 kg (119 lb)    Exam (no change from exam on 09/07/2017)  General: Well developed, chronically ill appearing, frail, NAD  HEENT: NCAT, mucous membranes moist.   Cardiovascular: S1 S2 auscultated, irregularly irregular- rate better controlled today  Respiratory: Clear to auscultation bilaterally, no wheezing  Abdomen: Soft, nontender, nondistended, + bowel sounds  Extremities: warm dry without cyanosis clubbing. +LE edema-improving   Neuro: AAOx2, nonfocal, dementia  Psych: pleasant an appropriate   Data Reviewed: I have personally reviewed following labs and imaging studies  CBC: Recent Labs  Lab 09/04/17 0102 09/05/17 0518 09/06/17 0449 09/07/17 0722  WBC 9.2 7.1 8.5 7.8  HGB 14.5 12.2 12.7 13.0  HCT 44.8 38.8 40.3 41.8  MCV 87.2 89.0 88.0  88.9  PLT 262 201 202 983   Basic Metabolic Panel: Recent Labs  Lab 09/04/17 0102 09/05/17 0518 09/06/17 0449 09/07/17 0722 09/08/17 0725  NA 135 138 136 135 134*  K 4.2 3.1* 5.2* 5.5* 4.7  CL 94* 98* 98* 97* 96*  CO2 29 30 29 30 28   GLUCOSE 140* 100* 134* 113* 108*  BUN 24* 15 21* 18 16  CREATININE 1.14* 0.78 0.73 0.72 0.56  CALCIUM 9.8 8.2* 9.6 9.7 9.3   GFR: Estimated Creatinine Clearance: 34.9 mL/min (by C-G formula based on SCr of 0.56 mg/dL). Liver Function Tests: Recent Labs  Lab 09/06/17 0449  AST 22  ALT 17  ALKPHOS 97  BILITOT 1.0  PROT 5.3*  ALBUMIN 2.8*   No results for input(s): LIPASE, AMYLASE in the last 168 hours. No results for input(s): AMMONIA in the last 168 hours. Coagulation Profile: Recent Labs  Lab 09/04/17 0102 09/05/17 0518 09/06/17 0449 09/07/17 0722 09/08/17 0725  INR 1.88 2.80 2.78 1.87 1.54   Cardiac Enzymes: Recent Labs  Lab 09/04/17 0102  TROPONINI <0.03   BNP (last 3 results) No results for input(s): PROBNP in the last 8760 hours. HbA1C: No results for input(s): HGBA1C in the last 72 hours. CBG: Recent Labs  Lab 09/05/17 1156  GLUCAP 124*   Lipid Profile: No results for input(s): CHOL, HDL, LDLCALC, TRIG, CHOLHDL, LDLDIRECT in  the last 72 hours. Thyroid Function Tests: No results for input(s): TSH, T4TOTAL, FREET4, T3FREE, THYROIDAB in the last 72 hours. Anemia Panel: No results for input(s): VITAMINB12, FOLATE, FERRITIN, TIBC, IRON, RETICCTPCT in the last 72 hours. Urine analysis:    Component Value Date/Time   COLORURINE STRAW (A) 09/04/2017 0759   APPEARANCEUR CLEAR 09/04/2017 0759   LABSPEC 1.011 09/04/2017 0759   PHURINE 7.0 09/04/2017 0759   GLUCOSEU NEGATIVE 09/04/2017 0759   HGBUR NEGATIVE 09/04/2017 0759   BILIRUBINUR NEGATIVE 09/04/2017 0759   KETONESUR NEGATIVE 09/04/2017 0759   PROTEINUR NEGATIVE 09/04/2017 0759   UROBILINOGEN 1.0 02/05/2014 1258   NITRITE NEGATIVE 09/04/2017 0759    LEUKOCYTESUR NEGATIVE 09/04/2017 0759   Sepsis Labs: @LABRCNTIP (procalcitonin:4,lacticidven:4)  ) Recent Results (from the past 240 hour(s))  Culture, Urine     Status: None   Collection Time: 09/04/17  7:59 AM  Result Value Ref Range Status   Specimen Description URINE, CLEAN CATCH  Final   Special Requests NONE  Final   Culture NO GROWTH  Final   Report Status 09/05/2017 FINAL  Final  Respiratory Panel by PCR     Status: None   Collection Time: 09/04/17  8:11 AM  Result Value Ref Range Status   Adenovirus NOT DETECTED NOT DETECTED Final   Coronavirus 229E NOT DETECTED NOT DETECTED Final   Coronavirus HKU1 NOT DETECTED NOT DETECTED Final   Coronavirus NL63 NOT DETECTED NOT DETECTED Final   Coronavirus OC43 NOT DETECTED NOT DETECTED Final   Metapneumovirus NOT DETECTED NOT DETECTED Final   Rhinovirus / Enterovirus NOT DETECTED NOT DETECTED Final   Influenza A NOT DETECTED NOT DETECTED Final   Influenza B NOT DETECTED NOT DETECTED Final   Parainfluenza Virus 1 NOT DETECTED NOT DETECTED Final   Parainfluenza Virus 2 NOT DETECTED NOT DETECTED Final   Parainfluenza Virus 3 NOT DETECTED NOT DETECTED Final   Parainfluenza Virus 4 NOT DETECTED NOT DETECTED Final   Respiratory Syncytial Virus NOT DETECTED NOT DETECTED Final   Bordetella pertussis NOT DETECTED NOT DETECTED Final   Chlamydophila pneumoniae NOT DETECTED NOT DETECTED Final   Mycoplasma pneumoniae NOT DETECTED NOT DETECTED Final      Radiology Studies: No results found.   Scheduled Meds: . calcium-vitamin D  1 tablet Oral BID  . collagenase   Topical Daily  . digoxin  0.125 mg Oral Daily  . escitalopram  10 mg Oral Daily  . feeding supplement (ENSURE ENLIVE)  237 mL Oral TID BM  . levothyroxine  137 mcg Oral QAC breakfast  . [START ON 09/09/2017] metoprolol succinate  100 mg Oral Daily  . metoprolol tartrate  37.5 mg Oral BID  . multivitamin with minerals  1 tablet Oral Daily  . simvastatin  40 mg Oral q1800  .  Warfarin - Pharmacist Dosing Inpatient   Does not apply q1800   Continuous Infusions: . methocarbamol (ROBAXIN)  IV       LOS: 4 days   Time Spent in minutes   30 minutes  Shareef Eddinger D.O. on 09/08/2017 at 10:07 AM  Between 7am to 7pm - Pager - [phone]  After 7pm go to [email or web address] - password [email or web address]  And look for the night coverage person covering for me after hours  [facility] Office  [phone]

## 2017-09-08 NOTE — Progress Notes (Addendum)
ANTICOAGULATION CONSULT NOTE - Follow Up Consult  Pharmacy Consult for Coumadin Indication: atrial fibrillation  Allergies  Allergen Reactions  . Foradil [Formoterol] Other (See Comments)    Reaction not recalled (??)    Patient Measurements: Height: 5' (152.4 cm) Weight: 119 lb (54 kg) IBW/kg (Calculated) : 45.5  Vital Signs: Temp: 98 F (36.7 C) (01/28 0643) Temp Source: Oral (01/28 0643) BP: 124/86 (01/28 0643) Pulse Rate: 102 (01/28 0643)  Labs: Recent Labs    09/06/17 0449 09/07/17 0722 09/08/17 0725  HGB 12.7 13.0  --   HCT 40.3 41.8  --   PLT 202 196  --   LABPROT 29.1* 21.4* 18.3*  INR 2.78 1.87 1.54  CREATININE 0.73 0.72 0.56    Estimated Creatinine Clearance: 34.9 mL/min (by C-G formula based on SCr of 0.56 mg/dL).  Assessment:   Wendy Flowers continues on Coumadin as prior to admission for atrial fibrillation. Hx falls prior to admission, head CT negative for bleed. CTA negative for PE.  Multiple thoracic compression fractures, but no procedures planned.    INR down to 1.54 today, subtherapeutic.   1/24: INR 1.88 - Coumadin 3 mg  1/25: INR 2.80 - dose held  1/26: INR 2.78 - Coumadin 0.5 mg.  1/27: INR 1.87 - Coumadin 2 mg    Home Coumadin regimen: 2 mg daily.  Goal of Therapy:  INR 2-3 Monitor platelets by anticoagulation protocol: Yes   Plan:   Coumadin 2.5 mg x 1 today.  Daily PT/INR.  Arty Baumgartner, [place] Pager: [phone] or [phone] 09/08/2017,1:21 PM

## 2017-09-08 NOTE — Clinical Social Work Note (Signed)
Clinical Social Work Assessment  Patient Details  Name: Wendy Flowers MRN: [id number] Date of Birth: [birth year]  Date of referral:  09/08/17               Reason for consult:  Facility Placement                Permission sought to share information with:  Facility Sport and exercise psychologist, Family Supports Permission granted to share information::  No(patient asleep, nephew at bedside)  Name::     Wendy Flowers  Agency::  SNFs  Relationship::  nephew  Contact Information:  [phone]  Housing/Transportation Living arrangements for the past 2 months:  Single Family Home Source of Information:  Other (Comment Required)(nephew/HCPOA) Patient Interpreter Needed:  None Criminal Activity/Legal Involvement Pertinent to Current Situation/Hospitalization:  No - Comment as needed Significant Relationships:  Other Family Members Lives with:  Self Do you feel safe going back to the place where you live?  Yes Need for family participation in patient care:  Yes (Comment)  Care giving concerns: Patient from home independently. PT recommending SNF.   Social Worker assessment / plan: CSW met with patient's nephew, Wendy Flowers, at bedside and discussed SNF recommendation. Nephew agreeable to SNF and reported patient was at [facility] in October. CSW sent out initial referrals; patient accepted at [facility] and [facility]. [facility] will accept LOG while [employer] authorization pending. CSW supervisor approved 5 day LOG. Nephew agreeable for patient to discharge to [facility]. Per MD, patient not ready to discharge today. CSW to support with discharge when medically ready; if Wendy Flowers not received yet, will send LOG to [facility].  Employment status:  Retired Astronomer) PT Recommendations:  [place] / Referral to community resources:  [place]  Patient/Family's Response to care: Family appreciative of patient's  care.  Patient/Family's Understanding of and Emotional Response to Diagnosis, Current Treatment, and Prognosis: Family with understanding of patient's condition and agreeable to SNF.  Emotional Assessment Appearance:  Appears stated age Attitude/Demeanor/Rapport:  Unable to Assess Affect (typically observed):  Unable to Assess Orientation:  Oriented to Self, Oriented to Place Alcohol / Substance use:  Not Applicable Psych involvement (Current and /or in the community):  No (Comment)  Discharge Needs  Concerns to be addressed:  Discharge Planning Concerns, Care Coordination Readmission within the last 30 days:  No Current discharge risk:  Lives alone, Physical Impairment Barriers to Discharge:  Continued Medical Work up   Estanislado Emms, LCSW 09/08/2017, 11:52 AM

## 2017-09-08 NOTE — Care Management Important Message (Signed)
Important Message  Patient Details  Name: DEION FORGUE MRN: [id number] Date of Birth: [birth year]   Medicare Important Message Given:  Yes    Shirly Bartosiewicz [address] 09/08/2017, 12:36 PM

## 2017-09-09 DIAGNOSIS — E034 Atrophy of thyroid (acquired): Secondary | ICD-10-CM | POA: Diagnosis not present

## 2017-09-09 DIAGNOSIS — W19XXXA Unspecified fall, initial encounter: Secondary | ICD-10-CM | POA: Diagnosis not present

## 2017-09-09 DIAGNOSIS — R489 Unspecified symbolic dysfunctions: Secondary | ICD-10-CM | POA: Diagnosis not present

## 2017-09-09 DIAGNOSIS — E782 Mixed hyperlipidemia: Secondary | ICD-10-CM | POA: Diagnosis not present

## 2017-09-09 DIAGNOSIS — I5023 Acute on chronic systolic (congestive) heart failure: Secondary | ICD-10-CM | POA: Diagnosis not present

## 2017-09-09 DIAGNOSIS — M419 Scoliosis, unspecified: Secondary | ICD-10-CM | POA: Diagnosis not present

## 2017-09-09 DIAGNOSIS — R791 Abnormal coagulation profile: Secondary | ICD-10-CM | POA: Diagnosis not present

## 2017-09-09 DIAGNOSIS — I5022 Chronic systolic (congestive) heart failure: Secondary | ICD-10-CM | POA: Diagnosis not present

## 2017-09-09 DIAGNOSIS — M40209 Unspecified kyphosis, site unspecified: Secondary | ICD-10-CM | POA: Diagnosis not present

## 2017-09-09 DIAGNOSIS — J9600 Acute respiratory failure, unspecified whether with hypoxia or hypercapnia: Secondary | ICD-10-CM | POA: Diagnosis not present

## 2017-09-09 DIAGNOSIS — I10 Essential (primary) hypertension: Secondary | ICD-10-CM | POA: Diagnosis not present

## 2017-09-09 DIAGNOSIS — S32000A Wedge compression fracture of unspecified lumbar vertebra, initial encounter for closed fracture: Secondary | ICD-10-CM | POA: Diagnosis not present

## 2017-09-09 DIAGNOSIS — J984 Other disorders of lung: Secondary | ICD-10-CM | POA: Diagnosis not present

## 2017-09-09 DIAGNOSIS — E039 Hypothyroidism, unspecified: Secondary | ICD-10-CM | POA: Diagnosis not present

## 2017-09-09 DIAGNOSIS — Z7901 Long term (current) use of anticoagulants: Secondary | ICD-10-CM | POA: Diagnosis not present

## 2017-09-09 DIAGNOSIS — F329 Major depressive disorder, single episode, unspecified: Secondary | ICD-10-CM | POA: Diagnosis not present

## 2017-09-09 DIAGNOSIS — I4891 Unspecified atrial fibrillation: Secondary | ICD-10-CM | POA: Diagnosis not present

## 2017-09-09 DIAGNOSIS — N179 Acute kidney failure, unspecified: Secondary | ICD-10-CM | POA: Diagnosis not present

## 2017-09-09 DIAGNOSIS — M6281 Muscle weakness (generalized): Secondary | ICD-10-CM | POA: Diagnosis not present

## 2017-09-09 DIAGNOSIS — L89322 Pressure ulcer of left buttock, stage 2: Secondary | ICD-10-CM | POA: Diagnosis not present

## 2017-09-09 DIAGNOSIS — D329 Benign neoplasm of meninges, unspecified: Secondary | ICD-10-CM | POA: Diagnosis not present

## 2017-09-09 DIAGNOSIS — I482 Chronic atrial fibrillation: Secondary | ICD-10-CM | POA: Diagnosis not present

## 2017-09-09 DIAGNOSIS — J9601 Acute respiratory failure with hypoxia: Secondary | ICD-10-CM | POA: Diagnosis not present

## 2017-09-09 DIAGNOSIS — R630 Anorexia: Secondary | ICD-10-CM | POA: Diagnosis not present

## 2017-09-09 DIAGNOSIS — L8990 Pressure ulcer of unspecified site, unspecified stage: Secondary | ICD-10-CM | POA: Diagnosis not present

## 2017-09-09 DIAGNOSIS — J810 Acute pulmonary edema: Secondary | ICD-10-CM | POA: Diagnosis not present

## 2017-09-09 DIAGNOSIS — R69 Illness, unspecified: Secondary | ICD-10-CM | POA: Diagnosis not present

## 2017-09-09 DIAGNOSIS — L89159 Pressure ulcer of sacral region, unspecified stage: Secondary | ICD-10-CM | POA: Diagnosis not present

## 2017-09-09 DIAGNOSIS — E785 Hyperlipidemia, unspecified: Secondary | ICD-10-CM | POA: Diagnosis not present

## 2017-09-09 DIAGNOSIS — R0902 Hypoxemia: Secondary | ICD-10-CM | POA: Diagnosis not present

## 2017-09-09 LAB — BASIC METABOLIC PANEL
ANION GAP: 7 (ref 5–15)
BUN: 16 mg/dL (ref 6–20)
CHLORIDE: 95 mmol/L — AB (ref 101–111)
CO2: 32 mmol/L (ref 22–32)
Calcium: 9.3 mg/dL (ref 8.9–10.3)
Creatinine, Ser: 0.64 mg/dL (ref 0.44–1.00)
GFR calc non Af Amer: >60 mL/min (ref >60)
GLUCOSE: 104 mg/dL — AB (ref 65–99)
Potassium: 4.8 mmol/L (ref 3.5–5.1)
Sodium: 134 mmol/L — ABNORMAL LOW (ref 135–145)

## 2017-09-09 LAB — PROTIME-INR
INR: 1.65
Prothrombin Time: 19.4 seconds — ABNORMAL HIGH (ref 11.4–15.2)

## 2017-09-09 MED ORDER — METOPROLOL SUCCINATE ER 100 MG PO TB24: 100.0000 mg | ORAL_TABLET | Freq: Every day | ORAL | 0 refills | Status: AC

## 2017-09-09 MED ORDER — COLLAGENASE 250 UNIT/GM EX OINT: TOPICAL_OINTMENT | Freq: Every day | CUTANEOUS | 0 refills | Status: AC

## 2017-09-09 MED ORDER — ADULT MULTIVITAMIN W/MINERALS CH: 1.0000 | ORAL_TABLET | Freq: Every day | ORAL | Status: AC

## 2017-09-09 MED ORDER — DIGOXIN 125 MCG PO TABS: 0.1250 mg | ORAL_TABLET | Freq: Every day | ORAL | 0 refills | Status: AC

## 2017-09-09 MED ORDER — ENSURE ENLIVE PO LIQD: 237.0000 mL | Freq: Three times a day (TID) | ORAL | 12 refills | Status: AC

## 2017-09-09 NOTE — Progress Notes (Signed)
Progress Note  Patient Name: Wendy Flowers Date of Encounter: 09/09/2017  Primary Cardiologist: Sanda Klein, MD   Subjective   Sleepy this morning. Appears comfortable.   Inpatient Medications    Scheduled Meds: . calcium-vitamin D  1 tablet Oral BID  . collagenase   Topical Daily  . digoxin  0.125 mg Oral Daily  . escitalopram  10 mg Oral Daily  . feeding supplement (ENSURE ENLIVE)  237 mL Oral TID BM  . levothyroxine  137 mcg Oral QAC breakfast  . metoprolol succinate  100 mg Oral Daily  . multivitamin with minerals  1 tablet Oral Daily  . simvastatin  40 mg Oral q1800  . Warfarin - Pharmacist Dosing Inpatient   Does not apply q1800   Continuous Infusions: . methocarbamol (ROBAXIN)  IV     PRN Meds: acetaminophen, methocarbamol (ROBAXIN)  IV, metoprolol tartrate, ondansetron (ZOFRAN) IV, traMADol   Vital Signs    Vitals:   09/08/17 2115 09/08/17 2238 09/09/17 0618 09/09/17 0800  BP: 124/83 124/83 108/67 (!) 96/57  Pulse: 100 (!) 102 (!) 113   Resp: (!) 21  19 14   Temp: 98.1 F (36.7 C)  97.9 F (36.6 C)   TempSrc: Oral  Oral   SpO2: 97%  93%   Weight:   119 lb 14.9 oz (54.4 kg)   Height:        Intake/Output Summary (Last 24 hours) at 09/09/2017 0831 Last data filed at 09/09/2017 0302 Gross per 24 hour  Intake 720 ml  Output 700 ml  Net 20 ml   Filed Weights   09/07/17 0542 09/08/17 0643 09/09/17 0618  Weight: 118 lb 14.4 oz (53.9 kg) 119 lb (54 kg) 119 lb 14.9 oz (54.4 kg)    Telemetry    Afib 80's-90's - Personally Reviewed  ECG    No new tracings - Personally Reviewed  Physical Exam  Sleepy, but wakes easily and is oriented x 2 (mildly confused, but no different from yesterday) GEN: No acute distress. Prominent kyphosis   Neck: No JVD Cardiac: irregular, no murmurs, rubs, or gallops.  Respiratory: Clear to auscultation bilaterally. GI: Soft, nontender, non-distended  MS: No edema; No deformity. Neuro:  Nonfocal  Psych: Normal affect     Labs    Chemistry Recent Labs  Lab 09/06/17 0449 09/07/17 0722 09/08/17 0725 09/09/17 0448  NA 136 135 134* 134*  K 5.2* 5.5* 4.7 4.8  CL 98* 97* 96* 95*  CO2 29 30 28  32  GLUCOSE 134* 113* 108* 104*  BUN 21* 18 16 16   CREATININE 0.73 0.72 0.56 0.64  CALCIUM 9.6 9.7 9.3 9.3  PROT 5.3*  --   --   --   ALBUMIN 2.8*  --   --   --   AST 22  --   --   --   ALT 17  --   --   --   ALKPHOS 97  --   --   --   BILITOT 1.0  --   --   --   GFRNONAA >60 >60 >60 >60  GFRAA >60 >60 >60 >60  ANIONGAP 9 8 10 7      Hematology Recent Labs  Lab 09/05/17 0518 09/06/17 0449 09/07/17 0722  WBC 7.1 8.5 7.8  RBC 4.36 4.58 4.70  HGB 12.2 12.7 13.0  HCT 38.8 40.3 41.8  MCV 89.0 88.0 88.9  MCH 28.0 27.7 27.7  MCHC 31.4 31.5 31.1  RDW 15.7* 15.4 15.4  PLT  201 202 196    Cardiac Enzymes Recent Labs  Lab 09/04/17 0102  TROPONINI <0.03   No results for input(s): TROPIPOC in the last 168 hours.   BNP Recent Labs  Lab 09/04/17 0102  BNP 285.3*     DDimer No results for input(s): DDIMER in the last 168 hours.   Radiology    No results found.  Cardiac Studies    Echo Sep 04 2017 Study Conclusions  - Left ventricle: The cavity size was normal. Systolic function was mildly reduced. The estimated ejection fraction was in the range of 45% to 50%. Wall motion was normal; there were no regional wall motion abnormalities. - Ventricular septum: The contour showed systolic flattening. These changes are consistent with RV pressure overload. - Mitral valve: Mildly calcified, moderately fibrotic annulus. There was moderate regurgitation. - Left atrium: The atrium was moderately dilated. - Right ventricle: The cavity size was moderately dilated. Wall thickness was normal. Systolic function was reduced. - Right atrium: The atrium was severely dilated. - Tricuspid valve: There was moderate regurgitation. - Pulmonary arteries: Systolic pressure was moderately  increased. PA peak pressure: 60 mm Hg (S).    Patient Profile     84 y.o. female with long-standing history of permanent atrial fibrillation and moderate pulmonary hypertension, admitted after a fall at home for atrial fibrillation rapid ventricular response  Assessment & Plan    1. Atrial fibrillation:  -Rate controlled on Metoprolol succinate 100 mg and digoxin. 80's-90's- optimal for this patient.  -CHA2DS2/VAS Stroke Risk Score 5 (CHF, HTN, age (2), female). Pt is on warfarin anticoagulation for stroke risk reduction.  Today INR 1.65, pharmacy is managing.   2. PAH: Per Dr. Sallyanne Kuster note: Moderate pulmonary Hypertension which previous right catheterization has shown to be secondary to primary pulmonary arteriolar obstruction, not due to left heart failure. The exact cause was never established, but the abnormality appeared to be stable in severity. The most likely cause was felt to be restrictive lung disease from thoracic kyphoscoliosis. The echo performed on this admission shows only a mild worsening of PAH compared to previous evaluations 2011-2015.Currenlty little evidence of elevated right heart filling pressures on exam. No plans for additional investigations at this time.  3. Cardiomyopathy: -mildly depressed LVEF- 45-50%, stable compared to previous studies. Per Dr. Sallyanne Kuster, Consider infiltrative cardiomyopathy in view of severe biatrial enlargement. She does not have angina pectoris. There is no indication for invasive evaluation for CAD.  4. Cognitive decline/dementia: INR was in therapeutic range on admission. Warfarin for stroke risk reduction. There is question of whether pt should continue to live alone.   For questions or updates, please contact [place] Please consult [email or web address] for contact info under Cardiology/STEMI.      Signed, Daune Perch, NP  09/09/2017, 8:31 AM    I have seen and examined the patient along with Daune Perch, NP.  I have  reviewed the chart, notes and new data.  I agree with NP's note.  Key new complaints: sleepy Key examination changes: improved rate control, no JVD, trivial ankle edema Key new findings / data: VR 90-105 on monitor  PLAN: I think this is an acceptable degree of rate control for this elderly and sedentary lady. Immediate plan is for SNF, but I worry that she is no longer appropriate for independent living due to her frailty and worsening cognitive issues. I am particularly worried about falls and injury if she is not closely supervised. Would benefit from home O2 for hypoxia and  PAH. Recheck O2 sat at rest and with ambulation to see if she qualifies. It's also reasonable to consider palliative care, but need to involve her family in the decision process.  Sanda Klein, MD, [place] [phone] 09/09/2017, 9:10 AM

## 2017-09-09 NOTE — Progress Notes (Signed)
Physical Therapy Treatment Patient Details Name: Wendy Flowers MRN: [id number] DOB: 05/28/1927 Today's Date: 09/09/2017    History of Present Illness 84 y.o.femalewith medical history significant forknown pulmonary hypertension diagnosed by right heart cath/Croituro,chronic atrial fibrillation on warfarin, hypertension, hypothyroidism, systolic heart failure with restrictive physiology, osteoporosis with chronic compression fractures and dyslipidemia. Pt presenting after fall at home where she hit her head. Pt noted to be in afib with RVR. CT negative for acute findings, pt desating on RA in ED, chest xray revealed cardiomegaly with small pleural effusions, and multiple thoracic compression fractures.    PT Comments    Emphasis on moving through tasks to give pt time to accomplish task with as little assist as possible.   Follow Up Recommendations  SNF;Supervision/Assistance - 24 hour     Equipment Recommendations  None recommended by PT    Recommendations for Other Services       Precautions / Restrictions Precautions Precautions: Fall    Mobility  Bed Mobility Overal bed mobility: Needs Assistance Bed Mobility: Rolling;Sidelying to Sit;Sit to Sidelying Rolling: Min assist Sidelying to sit: Mod assist     Sit to sidelying: Mod assist General bed mobility comments: Cues for sequencing of task and truncal assist  Transfers Overall transfer level: Needs assistance Equipment used: Rolling walker (2 wheeled) Transfers: Sit to/from Stand Sit to Stand: Min assist         General transfer comment: cues for hand placement and assist to come forward and up.  Ambulation/Gait Ambulation/Gait assistance: Min assist Ambulation Distance (Feet): 3 Feet(forward and back.) Assistive device: Rolling walker (2 wheeled)       General Gait Details: supportive assist for weak-kneed, mildly unsteady steps   Stairs            Wheelchair Mobility    Modified Rankin (Stroke  Patients Only)       Balance Overall balance assessment: Needs assistance   Sitting balance-Leahy Scale: Fair       Standing balance-Leahy Scale: Poor                              Cognition Arousal/Alertness: Awake/alert Behavior During Therapy: WFL for tasks assessed/performed Overall Cognitive Status: History of cognitive impairments - at baseline(not tested formally)                                 General Comments: follows direction throughout treatment      Exercises      General Comments        Pertinent Vitals/Pain Pain Assessment: Faces Faces Pain Scale: Hurts little more Pain Location: back Pain Descriptors / Indicators: Grimacing Pain Intervention(s): Monitored during session    Home Living                      Prior Function            PT Goals (current goals can now be found in the care plan section) Acute Rehab PT Goals Patient Stated Goal: feel better PT Goal Formulation: With patient Time For Goal Achievement: 09/21/17 Potential to Achieve Goals: Fair    Frequency    Min 2X/week      PT Plan Current plan remains appropriate    Co-evaluation              AM-PAC PT "6 Clicks" Daily Activity  Outcome Measure  Difficulty turning over in bed (including adjusting bedclothes, sheets and blankets)?: A Little Difficulty moving from lying on back to sitting on the side of the bed? : Unable Difficulty sitting down on and standing up from a chair with arms (e.g., wheelchair, bedside commode, etc,.)?: Unable Help needed moving to and from a bed to chair (including a wheelchair)?: A Little Help needed walking in hospital room?: A Lot Help needed climbing 3-5 steps with a railing? : A Lot 6 Click Score: 12    End of Session Equipment Utilized During Treatment: Oxygen Activity Tolerance: Patient limited by fatigue Patient left: in chair;with call bell/phone within reach;with chair alarm set Nurse  Communication: Mobility status PT Visit Diagnosis: Unsteadiness on feet (R26.81);Muscle weakness (generalized) (M62.81);Difficulty in walking, not elsewhere classified (R26.2)     Time: 0017-4944 PT Time Calculation (min) (ACUTE ONLY): 18 min  Charges:  $Therapeutic Activity: 8-22 mins                    G Codes:       09-26-2017  Donnella Sham, PT [phone]  (pager)   Wendy Flowers 2017-09-26, 6:26 PM

## 2017-09-09 NOTE — Clinical Social Work Placement (Signed)
   CLINICAL SOCIAL WORK PLACEMENT  NOTE  Date:  09/09/2017  Patient Details  Name: Wendy Flowers MRN: [id number] Date of Birth: [birth year]  Clinical Social Work is seeking post-discharge placement for this patient at the [place] level of care (*CSW will initial, date and re-position this form in  chart as items are completed):  Yes   Patient/family provided with [place] Work Department's list of facilities offering this level of care within the geographic area requested by the patient (or if unable, by the patient's family).  Yes   Patient/family informed of their freedom to choose among providers that offer the needed level of care, that participate in Medicare, Medicaid or managed care program needed by the patient, have an available bed and are willing to accept the patient.  Yes   Patient/family informed of [place]'s ownership interest in [facility] and [facility], as well as of the fact that they are under no obligation to receive care at these facilities.  PASRR submitted to EDS on       PASRR number received on       Existing PASRR number confirmed on 09/08/17     FL2 transmitted to all facilities in geographic area requested by pt/family on 09/08/17     FL2 transmitted to all facilities within larger geographic area on       Patient informed that his/her managed care company has contracts with or will negotiate with certain facilities, including the following:  [facility]     Yes   Patient/family informed of bed offers received.  Patient chooses bed at [facility]     Physician recommends and patient chooses bed at      Patient to be transferred to [facility] on 09/09/17.  Patient to be transferred to facility by [facility]     Patient family notified on 09/09/17 of transfer.  Name of family member notified:  [name], nephew     PHYSICIAN Please prepare  priority discharge summary, including medications, Please prepare prescriptions     Additional Comment:    _______________________________________________ Estanislado Emms, LCSW 09/09/2017, 11:25 AM

## 2017-09-09 NOTE — Progress Notes (Signed)
CSW received message from evening social worker that patient's nephew no longer agreeable to [facility]. Nephew prefers [facility].   CSW met with nephew at patient's bedside. Nephew indicated he would prefer patient return to [facility] and explained that at her last rehab admission there, they paid for 5 days up front while her [employer] authorization was pending. CSW called the liaison for [employer] and [employer] and discussed. Liaison to follow up with administrator and determine if patient's auth can be switched to [place] and family pay up front again. CSW awaiting call back from liaison.  Wendy Flowers, [place]

## 2017-09-09 NOTE — Progress Notes (Addendum)
2:22 pm [facility] received Wendy Flowers for patient; nephew will not be required to private pay.  11:26 am Patient's nephew has agreed to pay privately at [facility] while patient's [employer] authorization is pending, as [facility] would not accept LOG. [employer] authorization request has been switched to [employer] from [facility].   Patient will discharge to [facility]) Anticipated discharge date: 09/09/17 Family notified: [name], nephew  Transportation by: Corey Harold  Nurse to call report to [phone].   CSW signing off.  Estanislado Emms, [place]  Clinical Social Worker

## 2017-09-09 NOTE — Discharge Summary (Addendum)
Physician Discharge Summary  Wendy Flowers FAO:[id number] DOB: 1926/10/27 DOA: 09/04/2017  PCP: Wendy Pretty, MD  Admit date: 09/04/2017 Discharge date: 09/09/2017  Time spent: 45 minutes  Recommendations for Outpatient Follow-up:  Patient will be discharged to skilled nursing facility. Continue physical and occupational therapy as tolerated.  Patient will need to follow up with primary care provider within one week of discharge. Repeat INR in 2-3 days.  Follow up with cardiology as needed. Patient should continue medications as prescribed.  Patient should follow a heart healthy diet.   Discharge Diagnoses:  Atrial fibrillation with RVR Acute respiratory failure with hypoxia Acute on chronic systolic heart failure/PAH Acute kidney injury Hyperkalemia Hypotension Fall Thoracic compression fracture Dementia Hypothyroidism Dyslipidemia Goals of care Severe protein calorie malnutrition  Discharge Condition: Stable  Diet recommendation: heart healthy  Filed Weights   09/07/17 0542 09/08/17 0643 09/09/17 0618  Weight: 53.9 kg (118 lb 14.4 oz) 54 kg (119 lb) 54.4 kg (119 lb 14.9 oz)    History of present illness:  on 09/04/2017 by Ms. Wendy Hearing, NP Wendy Flowers a 84 y.o.femalewith medical history significant forknown pulmonary hypertension diagnosed by right heart cath/Croituro,chronic atrial fibrillation on warfarin, hypertension, hypothyroidism, systolic heart failure with restrictive physiology, osteoporosis with chronic compression fractures and dyslipidemia. Was brought to the ER via EMS after a fall at home. It was reported that she accidentally pressed her life alert necklace and deputies responded to the home. Patient told themshe was fine but then fell into the doorway and hit her head on the side of the door. She was also complaining of some back pain prior to the fall. Patient was noted to be in atrial fibrillation with RVR with SBPsbetween 90 and 100. After  arrival to the ER it was noted her pulse oximetry was dropping to 86% on room air therefore nasal cannula oxygen was applied. CT imaging of head and cervical spine unrevealing for traumatic injury. Chest x-ray revealed cardiomegaly with small pleural effusions with left basilar atelectasis or pneumonia. Given her presentation with hypoxemia and lower extremity edema as well as subtherapeutic INR CT angiography of the chest was completed to rule out PE. No PE was detected butbilateral pleural effusions were confirmedas well as airspace infiltrates likely representing edema due to congestive heart failure. In addition there was anincidental finding of multiple thoracic compression fractures consistent with osteoporosis with progression of the T3 fracture since previous study. Patient's BNP was slightly elevated at 285 and she had a mild acute kidney injury. She has been given Lasix 40 mg IV with marked increase in urinary output.  Hospital Course:  Atrial fibrillation with RVR -CHADSVASC 5 -Continue Coumadin per pharmacy, INR 1.65 -cardiology consulted and appreciated -Rate better controlled today, patient was started on digoxin on 09/06/17 -transitioned to metoprolol succinate 100mg  daily  -recheck INR in 2-3 days  Acute respiratory failure with hypoxia -Likely multifactorial including systolic heart failure, pulmonary hypertension -Continue to treat heart failure and monitor -Continue supplemental oxygen to maintain saturations above 92% -Continue 2L O2 on discharge, O2 sats dropped to 87-91% on room air at rest.   Acute on chronic systolic heart failure/PAH -BNP 285.3 -presented with lower extremity edema -Echocardiogram in 2015: EF 45-50% -Echocardiogram EF 45-50%, no RWMA -cardiology not recommending diuresis at this time given low BP -patient's blood pressure improved, with mild LE edema -continue to monitor intake/output daily weights  Acute kidney injury -Baseline  creatinine approximate 0.7 -resolved, creatinine 0.64  Hyperkalemia -resolved   Hypotension -possibly  secondary to AF/beta blocker, diuresis -Resolved, BP better today -per cardiology, outpatient SBP is around 100  -Continue to monitor   Fall -PT/OT consulted- recommend SNF  Thoracic compression fracture -Unknown at this is acute versus subacute -Interventional radiology consulted and appreciated, patient deemed not to be candidate for vertebral augmentation -Continue pain control -As above, PT OT consulted  Dementia -Not on medications prior to admission -Discussed with patient's nephew at bedside who is also her power of attorney -likely should not be living at home alone  Hypothyroidism -Continue Synthroid -TSH 2.545  Dyslipidemia -Continue statin  Sacral ulcer -stage 3, POA -wound care consulted, continue wound care  Goals of care -discussed with nephew at bedside, he would like to discuss her code status with the rest of the family -Dicussed with several family members (09/06/17), transitioned patient to DNR  Severe protein calorie malnutrition -nutrition consulted, continue supplements  Code status: DNR  Consultants Cardiology Interventional radiology  Procedures  Echocardiogram  Discharge Exam: Vitals:   09/09/17 0618 09/09/17 0800  BP: 108/67 (!) 96/57  Pulse: (!) 113   Resp: 19 14  Temp: 97.9 F (36.6 C)   SpO2: 93%    She has no complaints.  Denies chest pain, shortness of breath, pain anywhere, abdominal pain, nausea or vomiting.   General: Well developed, chronically ill-appearing, no apparent distress, frail  HEENT: NCAT,mucous membranes moist.  Cardiovascular: S1 S2 auscultated, irregular  Respiratory: Clear to auscultation bilaterally with equal chest rise, no wheezing   Abdomen: Soft, nontender, nondistended, + bowel sounds  Extremities: warm dry without cyanosis clubbing or edema  Neuro: AAOx2, nonfocal,  dementia  Psych: pleasant  Discharge Instructions Discharge Instructions    Discharge instructions   Complete by:  As directed    Patient will be discharged to skilled nursing facility. Continue physical and occupational therapy as tolerated.  Patient will need to follow up with primary care provider within one week of discharge. Repeat INR in 2-3 days.  Follow up with cardiology as needed. Patient should continue medications as prescribed.  Patient should follow a heart healthy diet.     Allergies as of 09/09/2017      Reactions   Foradil [formoterol] Other (See Comments)   Reaction not recalled (??)      Medication List    STOP taking these medications   furosemide 40 MG tablet Commonly known as:  LASIX     TAKE these medications   acetaminophen 325 MG tablet Commonly known as:  TYLENOL Take 650 mg by mouth every 6 (six) hours as needed for moderate pain.   Calcium + D3 600-200 MG-UNIT Tabs Take 1 tablet by mouth daily.   collagenase ointment Commonly known as:  SANTYL Apply topically daily. Apply Santyl to sacrum and left buttock wound Q day, then cover with moist gauze and foam dressing.  (Change foam dressing Q 3 days or PRN soiling.) Start taking on:  09/10/2017   digoxin 0.125 MG tablet Commonly known as:  LANOXIN Take 1 tablet (0.125 mg total) by mouth daily. Start taking on:  09/10/2017   escitalopram 10 MG tablet Commonly known as:  LEXAPRO 10mg  by mouth once daily   feeding supplement (ENSURE ENLIVE) Liqd Take 237 mLs by mouth 3 (three) times daily between meals.   levothyroxine 137 MCG tablet Commonly known as:  SYNTHROID, LEVOTHROID Take 137 mcg by mouth daily before breakfast.   metoprolol succinate 100 MG 24 hr tablet Commonly known as:  TOPROL-XL Take 1 tablet (100 mg  total) by mouth daily. Take with or immediately following a meal. Start taking on:  09/10/2017 What changed:    medication strength  how much to take  when to take  this  additional instructions   multivitamin with minerals Tabs tablet Take 1 tablet by mouth daily.   PROLIA 60 MG/ML Soln injection Generic drug:  denosumab Inject 60 mg into the skin every 6 (six) months. Start taking on:  11/14/2017   simvastatin 40 MG tablet Commonly known as:  ZOCOR 40mg  by mouth once daily   warfarin 2 MG tablet Commonly known as:  COUMADIN 2mg  by mouth once daily      Allergies  Allergen Reactions  . Foradil [Formoterol] Other (See Comments)    Reaction not recalled (??)    Contact information for follow-up providers    Wendy Pretty, MD. Schedule an appointment as soon as possible for a visit in 1 week(s).   Specialty:  Internal Medicine Why:  Hospital follow up Contact information: [address] [place] [address] [phone]        Sanda Klein, MD. Go to.   Specialty:  Cardiology Why:  As needed Contact information: [address] [place] [address] [phone]            Contact information for after-discharge care    Destination    [facility] Follow up.   Service:  Skilled Nursing Contact information: [address] [place] [phone]                   The results of significant diagnostics from this hospitalization (including imaging, microbiology, ancillary and laboratory) are listed below for reference.    Significant Diagnostic Studies: Dg Chest 2 View  Result Date: 09/04/2017 CLINICAL DATA:  Atrial fibrillation and shortness of breath EXAM: CHEST  2 VIEW COMPARISON:  08/11/2017 CT and chest x-ray, chest x-ray 02/05/2014 FINDINGS: Cardiomegaly with small left greater than right pleural effusions. Left basilar atelectasis or pneumonia. Aortic atherosclerosis. Multiple compression deformities of the spine. IMPRESSION: Cardiomegaly with small left greater than right pleural effusions and left basilar atelectasis or  pneumonia. Electronically Signed   By: Donavan Foil M.D.   On: 09/04/2017 01:30   Dg Thoracic Spine 2 View  Result Date: 08/11/2017 CLINICAL DATA:  Fall back pain EXAM: THORACIC SPINE 2 VIEWS COMPARISON:  CT CHEST 02/05/2014 FINDINGS: Thoracolumbar scoliosis. Generalized osteopenia. These factors limit evaluation for fracture. Compression fractures in the mid and lower thoracic spine including T7, T8, T11 are noted on the prior study. IMPRESSION: Image quality degraded by scoliosis and osteopenia. Multiple thoracic fractures appear chronic. If the patient has acute pain, MRI suggested for further evaluation. Electronically Signed   By: Franchot Gallo M.D.   On: 08/11/2017 14:06   Dg Lumbar Spine Complete  Result Date: 08/11/2017 CLINICAL DATA:  Lower back pain after fall. EXAM: LUMBAR SPINE - COMPLETE 4+ VIEW COMPARISON:  CT lumbar spine dated May 13, 2017. FINDINGS: Unchanged levoscoliosis of the lower thoracic and upper lumbar spine. Relatively unchanged compression deformities involving the T12, L1, and L2 vertebral bodies. No definite new vertebral body height loss. Alignment is normal. Mild lumbar facet arthropathy is unchanged. The bones are diffusely osteopenic. IMPRESSION: No definite acute compression deformity, although evaluation is limited due to severe osteopenia and scoliosis. If there is strong clinical concern for fracture, recommend CT for further evaluation. Electronically Signed   By: Orville Govern.D.  On: 08/11/2017 14:14   Ct Head Wo Contrast  Result Date: 09/04/2017 CLINICAL DATA:  Initial evaluation for acute trauma, fall. EXAM: CT HEAD WITHOUT CONTRAST CT CERVICAL SPINE WITHOUT CONTRAST TECHNIQUE: Multidetector CT imaging of the head and cervical spine was performed following the standard protocol without intravenous contrast. Multiplanar CT image reconstructions of the cervical spine were also generated. COMPARISON:  Prior CT from 08/11/2017. FINDINGS: CT HEAD FINDINGS  Brain: Age-related cerebral volume loss with moderate chronic small vessel ischemic disease no acute intracranial hemorrhage. No acute large vessel territory infarct. No mass lesion, midline shift or mass effect. No hydrocephalus. No extra-axial fluid collection. Vascular: No hyperdense vessel. Scattered vascular calcifications noted within the carotid siphons. Skull: Scalp soft tissues and calvarium within normal limits. Sinuses/Orbits: Globes and orbital soft tissues within normal limits. Paranasal sinuses and mastoid air cells are clear. Other: None. CT CERVICAL SPINE FINDINGS Alignment: Exaggeration of the normal cervical lordosis. Trace anterolisthesis of T2 on T3, stable. Skull base and vertebrae: Skull base intact. Normal C1-2 articulations are preserved in the dens is intact. There is progressive height loss with sclerosis at the superior endplate of T3 as compared to previous, consistent with acute to subacute compression fracture. Height loss of approximate 75% with 3 mm bony retropulsion. The vertebral body heights otherwise maintained. No other acute fracture. Soft tissues and spinal canal: Soft tissues of the neck demonstrate no acute abnormality. No prevertebral edema. Spinal canal within normal limits. Disc levels: Mild degenerative disc bulging noted at C3-4 and C4-5 without significant stenosis. Left-sided facet arthropathy noted at C4-5 and C5-6. Upper chest: Visualized upper chest within normal limits. Partially visualized lung apices are clear. Other: None. IMPRESSION: CT BRAIN: 1. No acute intracranial process. 2. Moderate generalized age-related cerebral atrophy with moderate chronic small vessel ischemic disease. CT CERVICAL SPINE: 1. No acute cervical spine injury identified. 2. Acute to subacute compression fracture involving the T3 vertebral body with up to 70% height loss and 3 mm bony retropulsion. There was a mild compression deformity at this level on prior study from 08/11/2017,  suggesting that this may be subacute in nature. Correlation with physical exam recommended. Electronically Signed   By: Jeannine Boga M.D.   On: 09/04/2017 04:55   Ct Head Wo Contrast  Result Date: 08/11/2017 CLINICAL DATA:  Tripped and fell this morning in her kitchen injuring the base of her neck and lower back. Chronic anticoagulation. EXAM: CT HEAD WITHOUT CONTRAST CT CERVICAL SPINE WITHOUT CONTRAST TECHNIQUE: Multidetector CT imaging of the head and cervical spine was performed following the standard protocol without intravenous contrast. Multiplanar CT image reconstructions of the cervical spine were also generated. COMPARISON:  05/13/2017 FINDINGS: CT HEAD FINDINGS Brain: There is no evidence for acute hemorrhage, hydrocephalus, or abnormal extra-axial fluid collection. 10 mm calcified extra-axial lesion in the left middle cranial fossa is stable. No definite CT evidence for acute infarction. Diffuse loss of parenchymal volume is consistent with atrophy. Patchy low attenuation in the deep hemispheric and periventricular white matter is nonspecific, but likely reflects chronic microvascular ischemic demyelination. Vascular: No hyperdense vessel or unexpected calcification. Skull: No evidence for fracture. No worrisome lytic or sclerotic lesion. Sinuses/Orbits: The visualized paranasal sinuses and mastoid air cells are clear. Visualized portions of the globes and intraorbital fat are unremarkable. Other: None. CT CERVICAL SPINE FINDINGS Alignment: Accentuated lordosis without subluxation. Skull base and vertebrae: No acute fracture in the cervical spine. There is compression deformity at T3, incompletely visualized potentially at T4. No primary bone  lesion or focal pathologic process. Soft tissues and spinal canal: No prevertebral fluid or swelling. No visible canal hematoma. Disc levels:  Mild loss of disc height noted C5-6. Upper chest: Unremarkable. Other: None. IMPRESSION: 1. No acute  intracranial abnormality. Atrophy with chronic small vessel white matter ischemic disease. 2. 10 mm partially calcified extra-axial lesion in the left middle cranial fossa, stable in the interval. This may well represent meningioma. 3. No evidence for an acute cervical spine fracture although compression deformity is noted at T3 and potentially at T4. These levels were not included on the previous cervical spine CT. Dedicated thoracic spine imaging could be used to further evaluate as clinically warranted. Electronically Signed   By: Misty Stanley M.D.   On: 08/11/2017 14:40   Ct Angio Chest Pe W And/or Wo Contrast  Result Date: 09/04/2017 CLINICAL DATA:  Back pain and shortness of breath. Fall, striking head on the floor. No loss of consciousness. History of dementia. Low oxygen saturation. EXAM: CT ANGIOGRAPHY CHEST WITH CONTRAST TECHNIQUE: Multidetector CT imaging of the chest was performed using the standard protocol during bolus administration of intravenous contrast. Multiplanar CT image reconstructions and MIPs were obtained to evaluate the vascular anatomy. CONTRAST:  170mL ISOVUE-370 IOPAMIDOL (ISOVUE-370) INJECTION 76% COMPARISON:  None. FINDINGS: Cardiovascular: Good opacification of the central and segmental pulmonary arteries. No focal filling defects. No evidence of significant pulmonary embolus. Diffuse cardiac enlargement with particular dilatation of the right heart and left atrium. No pericardial effusion. Normal caliber thoracic aorta with scattered calcifications. Coronary artery calcifications. Mediastinum/Nodes: Esophagus is decompressed. No significant lymphadenopathy in the chest. Lungs/Pleura: Small bilateral pleural effusions with basilar atelectasis. Emphysematous changes in the lungs. Patchy areas of airspace disease bilaterally likely representing edema. Multifocal pneumonia is another possibility. No pneumothorax. Airways are patent. Upper Abdomen: Multiple low-attenuation lesions  in the liver likely representing cysts. Musculoskeletal: Degenerative changes throughout the thoracic spine. Multiple thoracic vertebral compression deformities with ballooning of interspaces, likely indicating osteoporosis. Similar findings were demonstrated on previous CT thoracic spine from 08/11/2017. There is progression of compression at T3 since the previous study. Review of the MIP images confirms the above findings. IMPRESSION: 1. No evidence of significant pulmonary embolus. 2. Diffuse cardiac enlargement. 3. Bilateral pleural effusions and airspace infiltrates likely representing edema due to congestive failure. Pneumonia could also have this appearance. 4. Multiple thoracic compression fractures likely indicating osteoporosis. Progression of compression at T3 since the previous study. 5. Multiple hepatic cysts. Aortic Atherosclerosis (ICD10-I70.0). Electronically Signed   By: Lucienne Capers M.D.   On: 09/04/2017 04:37   Ct Cervical Spine Wo Contrast  Result Date: 09/04/2017 CLINICAL DATA:  Initial evaluation for acute trauma, fall. EXAM: CT HEAD WITHOUT CONTRAST CT CERVICAL SPINE WITHOUT CONTRAST TECHNIQUE: Multidetector CT imaging of the head and cervical spine was performed following the standard protocol without intravenous contrast. Multiplanar CT image reconstructions of the cervical spine were also generated. COMPARISON:  Prior CT from 08/11/2017. FINDINGS: CT HEAD FINDINGS Brain: Age-related cerebral volume loss with moderate chronic small vessel ischemic disease no acute intracranial hemorrhage. No acute large vessel territory infarct. No mass lesion, midline shift or mass effect. No hydrocephalus. No extra-axial fluid collection. Vascular: No hyperdense vessel. Scattered vascular calcifications noted within the carotid siphons. Skull: Scalp soft tissues and calvarium within normal limits. Sinuses/Orbits: Globes and orbital soft tissues within normal limits. Paranasal sinuses and mastoid  air cells are clear. Other: None. CT CERVICAL SPINE FINDINGS Alignment: Exaggeration of the normal cervical lordosis. Trace anterolisthesis of  T2 on T3, stable. Skull base and vertebrae: Skull base intact. Normal C1-2 articulations are preserved in the dens is intact. There is progressive height loss with sclerosis at the superior endplate of T3 as compared to previous, consistent with acute to subacute compression fracture. Height loss of approximate 75% with 3 mm bony retropulsion. The vertebral body heights otherwise maintained. No other acute fracture. Soft tissues and spinal canal: Soft tissues of the neck demonstrate no acute abnormality. No prevertebral edema. Spinal canal within normal limits. Disc levels: Mild degenerative disc bulging noted at C3-4 and C4-5 without significant stenosis. Left-sided facet arthropathy noted at C4-5 and C5-6. Upper chest: Visualized upper chest within normal limits. Partially visualized lung apices are clear. Other: None. IMPRESSION: CT BRAIN: 1. No acute intracranial process. 2. Moderate generalized age-related cerebral atrophy with moderate chronic small vessel ischemic disease. CT CERVICAL SPINE: 1. No acute cervical spine injury identified. 2. Acute to subacute compression fracture involving the T3 vertebral body with up to 70% height loss and 3 mm bony retropulsion. There was a mild compression deformity at this level on prior study from 08/11/2017, suggesting that this may be subacute in nature. Correlation with physical exam recommended. Electronically Signed   By: Jeannine Boga M.D.   On: 09/04/2017 04:55   Ct Cervical Spine Wo Contrast  Result Date: 08/11/2017 CLINICAL DATA:  Tripped and fell this morning in her kitchen injuring the base of her neck and lower back. Chronic anticoagulation. EXAM: CT HEAD WITHOUT CONTRAST CT CERVICAL SPINE WITHOUT CONTRAST TECHNIQUE: Multidetector CT imaging of the head and cervical spine was performed following the standard  protocol without intravenous contrast. Multiplanar CT image reconstructions of the cervical spine were also generated. COMPARISON:  05/13/2017 FINDINGS: CT HEAD FINDINGS Brain: There is no evidence for acute hemorrhage, hydrocephalus, or abnormal extra-axial fluid collection. 10 mm calcified extra-axial lesion in the left middle cranial fossa is stable. No definite CT evidence for acute infarction. Diffuse loss of parenchymal volume is consistent with atrophy. Patchy low attenuation in the deep hemispheric and periventricular white matter is nonspecific, but likely reflects chronic microvascular ischemic demyelination. Vascular: No hyperdense vessel or unexpected calcification. Skull: No evidence for fracture. No worrisome lytic or sclerotic lesion. Sinuses/Orbits: The visualized paranasal sinuses and mastoid air cells are clear. Visualized portions of the globes and intraorbital fat are unremarkable. Other: None. CT CERVICAL SPINE FINDINGS Alignment: Accentuated lordosis without subluxation. Skull base and vertebrae: No acute fracture in the cervical spine. There is compression deformity at T3, incompletely visualized potentially at T4. No primary bone lesion or focal pathologic process. Soft tissues and spinal canal: No prevertebral fluid or swelling. No visible canal hematoma. Disc levels:  Mild loss of disc height noted C5-6. Upper chest: Unremarkable. Other: None. IMPRESSION: 1. No acute intracranial abnormality. Atrophy with chronic small vessel white matter ischemic disease. 2. 10 mm partially calcified extra-axial lesion in the left middle cranial fossa, stable in the interval. This may well represent meningioma. 3. No evidence for an acute cervical spine fracture although compression deformity is noted at T3 and potentially at T4. These levels were not included on the previous cervical spine CT. Dedicated thoracic spine imaging could be used to further evaluate as clinically warranted. Electronically Signed    By: Misty Stanley M.D.   On: 08/11/2017 14:40   Ct Thoracic Spine Wo Contrast  Result Date: 08/11/2017 CLINICAL DATA:  Patient tripped and fell this morning complaining of lower back pain. EXAM: CT THORACIC SPINE WITHOUT CONTRAST; CT  LUMBAR SPINE WITHOUT CONTRAST TECHNIQUE: Multidetector CT images of the thoracic were obtained using the standard protocol without intravenous contrast. COMPARISON:  Chest CT 02/05/2014 FINDINGS: Alignment: Accentuation of mid to lower thoracic kyphosis attributable progression of multilevel degenerative disc disease, in particular spanning T5 through T9. Vertebrae: New since 2015 is a moderate T3 compression fracture without retropulsion with roughly 50% height loss. Its biconcave appearance would suggest an osteoporotic compression. Chronic stable mild compression fracture of T6, moderate compression of T8 and mild-to-moderate at T11. Slight progression in mild moderate compression of T12. Stable marked osteoporotic compression of L1 . Progression of L2 compression fracture, now biconcave in appearance without to 50% height loss since prior. Progression of mild-to-moderate L4 biconcave compression fracture since prior. Chronic moderate facet arthropathy with joint space narrowing and hypertrophy with remodeling from L3 through S1. Intact sacral ala and sacroiliac joints. Paraspinal and other soft tissues: Bilateral small to moderate pleural effusions with adjacent compressive atelectasis. There is aortic atherosclerosis without aneurysmal dilatation. No paraspinal hematoma nor retroperitoneal hemorrhage. 18 mm well-circumscribed hypodensity in the right hepatic lobe statistically more likely to represent a cyst or hemangioma. Stable cardiomegaly. Disc levels: No significant central canal stenosis. IMPRESSION: Thoracic spine: 1. New since 2015 comparison chest CT is a moderate T3 compression fracture without retropulsion with roughly 50% height loss. 2. Chronic stable mild T6,  moderate T8 and mild-to-moderate T11 compre compression fractures. 3. Small to moderate bilateral pleural effusions with atelectasis. Stable cardiomegaly. 4. 18 mm well-circumscribed hypodensity in the right hepatic lobe more likely to represent a cyst or hemangioma. Lumbar spine: 1. Progression of L2 and L4 compression fractures since 2015, moderate at L2 with 50% height loss and mild-to-moderate at L4. 2. Chronic stable marked osteoporotic compression fracture of L1. 3. Re- demonstration of lower lumbar facet arthropathy. Electronically Signed   By: Ashley Royalty M.D.   On: 08/11/2017 16:44   Ct Lumbar Spine Wo Contrast  Result Date: 08/11/2017 CLINICAL DATA:  Patient tripped and fell this morning complaining of lower back pain. EXAM: CT THORACIC SPINE WITHOUT CONTRAST; CT LUMBAR SPINE WITHOUT CONTRAST TECHNIQUE: Multidetector CT images of the thoracic were obtained using the standard protocol without intravenous contrast. COMPARISON:  Chest CT 02/05/2014 FINDINGS: Alignment: Accentuation of mid to lower thoracic kyphosis attributable progression of multilevel degenerative disc disease, in particular spanning T5 through T9. Vertebrae: New since 2015 is a moderate T3 compression fracture without retropulsion with roughly 50% height loss. Its biconcave appearance would suggest an osteoporotic compression. Chronic stable mild compression fracture of T6, moderate compression of T8 and mild-to-moderate at T11. Slight progression in mild moderate compression of T12. Stable marked osteoporotic compression of L1 . Progression of L2 compression fracture, now biconcave in appearance without to 50% height loss since prior. Progression of mild-to-moderate L4 biconcave compression fracture since prior. Chronic moderate facet arthropathy with joint space narrowing and hypertrophy with remodeling from L3 through S1. Intact sacral ala and sacroiliac joints. Paraspinal and other soft tissues: Bilateral small to moderate pleural  effusions with adjacent compressive atelectasis. There is aortic atherosclerosis without aneurysmal dilatation. No paraspinal hematoma nor retroperitoneal hemorrhage. 18 mm well-circumscribed hypodensity in the right hepatic lobe statistically more likely to represent a cyst or hemangioma. Stable cardiomegaly. Disc levels: No significant central canal stenosis. IMPRESSION: Thoracic spine: 1. New since 2015 comparison chest CT is a moderate T3 compression fracture without retropulsion with roughly 50% height loss. 2. Chronic stable mild T6, moderate T8 and mild-to-moderate T11 compre compression fractures. 3. Small to  moderate bilateral pleural effusions with atelectasis. Stable cardiomegaly. 4. 18 mm well-circumscribed hypodensity in the right hepatic lobe more likely to represent a cyst or hemangioma. Lumbar spine: 1. Progression of L2 and L4 compression fractures since 2015, moderate at L2 with 50% height loss and mild-to-moderate at L4. 2. Chronic stable marked osteoporotic compression fracture of L1. 3. Re- demonstration of lower lumbar facet arthropathy. Electronically Signed   By: Ashley Royalty M.D.   On: 08/11/2017 16:44    Microbiology: Recent Results (from the past 240 hour(s))  Culture, Urine     Status: None   Collection Time: 09/04/17  7:59 AM  Result Value Ref Range Status   Specimen Description URINE, CLEAN CATCH  Final   Special Requests NONE  Final   Culture NO GROWTH  Final   Report Status 09/05/2017 FINAL  Final  Respiratory Panel by PCR     Status: None   Collection Time: 09/04/17  8:11 AM  Result Value Ref Range Status   Adenovirus NOT DETECTED NOT DETECTED Final   Coronavirus 229E NOT DETECTED NOT DETECTED Final   Coronavirus HKU1 NOT DETECTED NOT DETECTED Final   Coronavirus NL63 NOT DETECTED NOT DETECTED Final   Coronavirus OC43 NOT DETECTED NOT DETECTED Final   Metapneumovirus NOT DETECTED NOT DETECTED Final   Rhinovirus / Enterovirus NOT DETECTED NOT DETECTED Final    Influenza A NOT DETECTED NOT DETECTED Final   Influenza B NOT DETECTED NOT DETECTED Final   Parainfluenza Virus 1 NOT DETECTED NOT DETECTED Final   Parainfluenza Virus 2 NOT DETECTED NOT DETECTED Final   Parainfluenza Virus 3 NOT DETECTED NOT DETECTED Final   Parainfluenza Virus 4 NOT DETECTED NOT DETECTED Final   Respiratory Syncytial Virus NOT DETECTED NOT DETECTED Final   Bordetella pertussis NOT DETECTED NOT DETECTED Final   Chlamydophila pneumoniae NOT DETECTED NOT DETECTED Final   Mycoplasma pneumoniae NOT DETECTED NOT DETECTED Final     Labs: Basic Metabolic Panel: Recent Labs  Lab 09/05/17 0518 09/06/17 0449 09/07/17 0722 09/08/17 0725 09/09/17 0448  NA 138 136 135 134* 134*  K 3.1* 5.2* 5.5* 4.7 4.8  CL 98* 98* 97* 96* 95*  CO2 30 29 30 28  32  GLUCOSE 100* 134* 113* 108* 104*  BUN 15 21* 18 16 16   CREATININE 0.78 0.73 0.72 0.56 0.64  CALCIUM 8.2* 9.6 9.7 9.3 9.3   Liver Function Tests: Recent Labs  Lab 09/06/17 0449  AST 22  ALT 17  ALKPHOS 97  BILITOT 1.0  PROT 5.3*  ALBUMIN 2.8*   No results for input(s): LIPASE, AMYLASE in the last 168 hours. No results for input(s): AMMONIA in the last 168 hours. CBC: Recent Labs  Lab 09/04/17 0102 09/05/17 0518 09/06/17 0449 09/07/17 0722  WBC 9.2 7.1 8.5 7.8  HGB 14.5 12.2 12.7 13.0  HCT 44.8 38.8 40.3 41.8  MCV 87.2 89.0 88.0 88.9  PLT 262 201 202 196   Cardiac Enzymes: Recent Labs  Lab 09/04/17 0102  TROPONINI <0.03   BNP: BNP (last 3 results) Recent Labs    05/14/17 0320 09/04/17 0102  BNP 298.6* 285.3*    ProBNP (last 3 results) No results for input(s): PROBNP in the last 8760 hours.  CBG: Recent Labs  Lab 09/05/17 1156  GLUCAP 124*       Signed:  Adrian Specht  [facility] 09/09/2017, 12:01 PM

## 2017-09-09 NOTE — Progress Notes (Signed)
Room air oxygen saturation 87% - 91% at rest.  2LNC reapplied.  Sats increased to 96% within a few minutes.

## 2017-09-11 ENCOUNTER — Non-Acute Institutional Stay (SKILLED_NURSING_FACILITY): Payer: Medicare HMO | Admitting: Internal Medicine

## 2017-09-11 DIAGNOSIS — I5022 Chronic systolic (congestive) heart failure: Secondary | ICD-10-CM | POA: Diagnosis not present

## 2017-09-11 DIAGNOSIS — L89159 Pressure ulcer of sacral region, unspecified stage: Secondary | ICD-10-CM | POA: Diagnosis not present

## 2017-09-11 DIAGNOSIS — E034 Atrophy of thyroid (acquired): Secondary | ICD-10-CM

## 2017-09-11 DIAGNOSIS — I482 Chronic atrial fibrillation, unspecified: Secondary | ICD-10-CM

## 2017-09-11 DIAGNOSIS — E782 Mixed hyperlipidemia: Secondary | ICD-10-CM

## 2017-09-11 DIAGNOSIS — I10 Essential (primary) hypertension: Secondary | ICD-10-CM

## 2017-09-12 DIAGNOSIS — F329 Major depressive disorder, single episode, unspecified: Secondary | ICD-10-CM | POA: Diagnosis not present

## 2017-09-12 DIAGNOSIS — R489 Unspecified symbolic dysfunctions: Secondary | ICD-10-CM | POA: Diagnosis not present

## 2017-09-12 DIAGNOSIS — I5023 Acute on chronic systolic (congestive) heart failure: Secondary | ICD-10-CM | POA: Diagnosis not present

## 2017-09-12 DIAGNOSIS — I10 Essential (primary) hypertension: Secondary | ICD-10-CM | POA: Diagnosis not present

## 2017-09-12 DIAGNOSIS — M6281 Muscle weakness (generalized): Secondary | ICD-10-CM | POA: Diagnosis not present

## 2017-09-12 DIAGNOSIS — R630 Anorexia: Secondary | ICD-10-CM | POA: Diagnosis not present

## 2017-09-12 DIAGNOSIS — Z7901 Long term (current) use of anticoagulants: Secondary | ICD-10-CM | POA: Diagnosis not present

## 2017-09-12 DIAGNOSIS — I4891 Unspecified atrial fibrillation: Secondary | ICD-10-CM | POA: Diagnosis not present

## 2017-09-12 DIAGNOSIS — L8990 Pressure ulcer of unspecified site, unspecified stage: Secondary | ICD-10-CM | POA: Diagnosis not present

## 2017-09-12 DIAGNOSIS — J984 Other disorders of lung: Secondary | ICD-10-CM | POA: Diagnosis not present

## 2017-09-12 DIAGNOSIS — E785 Hyperlipidemia, unspecified: Secondary | ICD-10-CM | POA: Diagnosis not present

## 2017-09-12 DIAGNOSIS — I482 Chronic atrial fibrillation: Secondary | ICD-10-CM | POA: Diagnosis not present

## 2017-09-12 DIAGNOSIS — J9601 Acute respiratory failure with hypoxia: Secondary | ICD-10-CM | POA: Diagnosis not present

## 2017-09-12 DIAGNOSIS — I5022 Chronic systolic (congestive) heart failure: Secondary | ICD-10-CM | POA: Diagnosis not present

## 2017-09-12 DIAGNOSIS — M419 Scoliosis, unspecified: Secondary | ICD-10-CM | POA: Diagnosis not present

## 2017-09-12 DIAGNOSIS — R69 Illness, unspecified: Secondary | ICD-10-CM | POA: Diagnosis not present

## 2017-09-12 DIAGNOSIS — W19XXXA Unspecified fall, initial encounter: Secondary | ICD-10-CM | POA: Diagnosis not present

## 2017-09-16 ENCOUNTER — Ambulatory Visit (INDEPENDENT_AMBULATORY_CARE_PROVIDER_SITE_OTHER): Payer: Medicare HMO | Admitting: Orthopaedic Surgery

## 2017-09-17 ENCOUNTER — Encounter: Payer: Self-pay | Admitting: Adult Health

## 2017-09-17 ENCOUNTER — Non-Acute Institutional Stay (SKILLED_NURSING_FACILITY): Payer: Medicare HMO | Admitting: Adult Health

## 2017-09-17 DIAGNOSIS — J984 Other disorders of lung: Secondary | ICD-10-CM

## 2017-09-17 DIAGNOSIS — I4891 Unspecified atrial fibrillation: Secondary | ICD-10-CM | POA: Diagnosis not present

## 2017-09-17 DIAGNOSIS — M419 Scoliosis, unspecified: Secondary | ICD-10-CM | POA: Diagnosis not present

## 2017-09-17 DIAGNOSIS — I5022 Chronic systolic (congestive) heart failure: Secondary | ICD-10-CM | POA: Diagnosis not present

## 2017-09-17 NOTE — Progress Notes (Addendum)
Location:  [facility] Room Number: 119-A Place of Service:  SNF (31)   CODE STATUS: DNR  Allergies  Allergen Reactions  . Foradil [Formoterol] Other (See Comments)    Reaction not recalled (??)    Chief Complaint  Patient presents with  . Acute Visit    Care plan meeting     HPI:  We have come together for a routine care plan meeting. The goal of her care is for assisted living placement. She will not be able to return back home to live independently. We did discuss her overall status; her medication regimen.  There are no other concerns at this time. There are reports of changes in mood state; no reports of uncontrolled pain; no reports of fevers present. There are no nursing concerns at this time.   Past Medical History:  Diagnosis Date  . Accident due to mechanical fall without injury   . Chronic systolic heart failure ([place])   . Dementia   . HLD (hyperlipidemia)   . HTN (hypertension)   . Hypertensive left ventricular hypertrophy with heart failure ([place])   . Hypothyroidism   . Lower extremity edema   . Osteoporosis   . Permanent atrial fibrillation ([place])   . Pulmonary hypertension ([place])    dx'd by right heart cath/Croituro  . Thoracic compression fracture [facility])     Past Surgical History:  Procedure Laterality Date  . BREAST LUMPECTOMY  8413   left  . CARDIAC CATHETERIZATION  06/13/2010   R & LHC: no CAD  . CATARACT EXTRACTION  2008   bilateral  . [place]   left  . US ECHOCARDIOGRAPHY  02/04/2012   LA mod. dilated,RA mild-mod dilated,mild MR,mod. TR    Social History   Socioeconomic History  . Marital status: Widowed    Spouse name: Not on file  . Number of children: Not on file  . Years of education: Not on file  . Highest education level: Not on file  Social Needs  . Financial resource strain: Not on file  . Food insecurity - worry: Not on file  . Food insecurity - inability: Not on file  . Transportation needs -  medical: Not on file  . Transportation needs - non-medical: Not on file  Occupational History  . Not on file  Tobacco Use  . Smoking status: Never Smoker  . Smokeless tobacco: Never Used  Substance and Sexual Activity  . Alcohol use: Yes    Comment: seldom; 03/17/2013-occasionally drinks a glass of wine before bed  . Drug use: No  . Sexual activity: Not on file  Other Topics Concern  . Not on file  Social History Narrative  . Not on file   Family History  Problem Relation Age of Onset  . Heart attack Mother   . Heart attack Father   . Diabetes Brother   . Cancer Brother       VITAL SIGNS BP 108/70   Pulse 99   Temp 98.1 F (36.7 C) (Oral)   Resp 16   Ht 5' (1.524 m)   SpO2 98%   BMI 23.42 kg/m   Outpatient Encounter Medications as of 09/17/2017  Medication Sig  . acetaminophen (TYLENOL) 325 MG tablet Take 650 mg by mouth every 6 (six) hours as needed for moderate pain.   Marland Kitchen atorvastatin (LIPITOR) 20 MG tablet Take 20 mg by mouth daily.  . Calcium Carb-Cholecalciferol (CALCIUM + D3) 600-200 MG-UNIT TABS Take 1 tablet by mouth daily.   Marland Kitchen  collagenase (SANTYL) ointment Apply topically daily. Apply Santyl to sacrum and left buttock wound Q day, then cover with moist gauze and foam dressing.  (Change foam dressing Q 3 days or PRN soiling.)  . digoxin (LANOXIN) 0.125 MG tablet Take 1 tablet (0.125 mg total) by mouth daily.  Marland Kitchen escitalopram (LEXAPRO) 10 MG tablet 10mg  by mouth once daily  . feeding supplement, ENSURE ENLIVE, (ENSURE ENLIVE) LIQD Take 237 mLs by mouth 3 (three) times daily between meals.  Marland Kitchen levothyroxine (SYNTHROID, LEVOTHROID) 137 MCG tablet Take 137 mcg by mouth daily before breakfast.  . metoprolol succinate (TOPROL-XL) 100 MG 24 hr tablet Take 1 tablet (100 mg total) by mouth daily. Take with or immediately following a meal.  . Multiple Vitamin (MULTIVITAMIN WITH MINERALS) TABS tablet Take 1 tablet by mouth daily.  . OXYGEN Inhale 2 L into the lungs continuous.   Derrill Memo ON 11/14/2017] PROLIA 60 MG/ML SOLN injection Inject 60 mg into the skin every 6 (six) months.  . warfarin (COUMADIN) 2 MG tablet 2mg  by mouth once daily  . [DISCONTINUED] simvastatin (ZOCOR) 40 MG tablet 40mg  by mouth once daily   No facility-administered encounter medications on file as of 09/17/2017.      SIGNIFICANT DIAGNOSTIC EXAMS  TODAY:   09-04-17: 2-d echo:   - Left ventricle: The cavity size was normal. Systolic function was mildly reduced. The estimated ejection fraction was in the range of 45% to 50%. Wall motion was normal; there were no regional wall motion abnormalities. - Ventricular septum: The contour showed systolic flattening. These changes are consistent with RV pressure overload. - Mitral valve: Mildly calcified, moderately fibrotic annulus. There was moderate regurgitation. - Left atrium: The atrium was moderately dilated. - Right ventricle: The cavity size was moderately dilated. Wall thickness was normal. Systolic function was reduced. - Right atrium: The atrium was severely dilated. - Tricuspid valve: There was moderate regurgitation. - Pulmonary arteries: Systolic pressure was moderately increased. PA peak pressure: 60 mm Hg (S).  09-04-17: ct of head and cervical spine:  CT BRAIN: 1. No acute intracranial process. 2. Moderate generalized age-related cerebral atrophy with moderate chronic small vessel ischemic disease.  CT CERVICAL SPINE: 1. No acute cervical spine injury identified. 2. Acute to subacute compression fracture involving the T3 vertebral body with up to 70% height loss and 3 mm bony retropulsion. There was a mild compression deformity at this level on prior study from 08/11/2017, suggesting that this may be subacute in nature. Correlation with physical exam recommended.  09-04-17: ct angio of chest: 1. No evidence of significant pulmonary embolus. 2. Diffuse cardiac enlargement. 3. Bilateral pleural effusions and airspace infiltrates likely  representing edema due to congestive failure. Pneumonia could also have this appearance. 4. Multiple thoracic compression fractures likely indicating osteoporosis. Progression of compression at T3 since the previous study. 5. Multiple hepatic cysts. Aortic Atherosclerosis     LABS REVIEWED:  09-04-17: wbc 9.2; hgb 14.5; hct 44.8; mcv 87.2; plt 262; glucose 140; bun 24; creat 1.14; k+ 4.2; n++ 135; CA 9.8; TSH 2.545; BNP 285.3; urine culture; no growth 09-05-17: chol 113; ldl 64; trig 49; hdl 39 09-07-17: wbc 7.8; hgb 13.0; hcy 41.8; mcv 88.9 ;plt 196 09-09-17: glucose 104; bun 16; creat 0.64; k+ 4.8; na++ 134; ca 9.3     Review of Systems  Constitutional: Negative for malaise/fatigue.  Respiratory: Negative for cough and shortness of breath.   Cardiovascular: Negative for chest pain, palpitations and leg swelling.  Gastrointestinal: Negative for abdominal  pain, constipation and heartburn.  Musculoskeletal: Negative for back pain, joint pain and myalgias.  Skin: Negative.   Neurological: Negative for dizziness.  Psychiatric/Behavioral: The patient is not nervous/anxious.     Physical Exam  Constitutional: She is oriented to person, place, and time. She appears well-developed and well-nourished. No distress.  Neck: No thyromegaly present.  Cardiovascular: Normal rate and intact distal pulses.  Murmur heard. Heart rate irregular irregular 1/6  Pulmonary/Chest: Effort normal and breath sounds normal. No respiratory distress.  Abdominal: Soft. Bowel sounds are normal. She exhibits no distension. There is no tenderness.  Musculoskeletal:  Able to move all extremities trace bilateral lower extremities  Kyphosis   Lymphadenopathy:    She has no cervical adenopathy.  Neurological: She is alert and oriented to person, place, and time.  Skin: Skin is warm and dry. She is not diaphoretic.  Psychiatric: She has a normal mood and affect.     ASSESSMENT/ PLAN:  TODAY:   1. Atrial  fibrillation with rvr 2. Chronic systolic heart failure 3. Restrictive lung disease due to kyphoscoliosis   Will continue her current plan of care  Will continue therapy as directed The goal of her care at this time is for assisted living placement    Time spent with patient and family: 40 minutes: discussed overall medical condition; medications; expectations of therapy and goals of care; has verbalized understanding.      MD is aware of resident's narcotic use and is in agreement with current plan of care. We will attempt to wean resident as apropriate   Ok Edwards NP [facility]  Contact [phone] Monday through Friday 8am- 5pm  After hours call [phone]

## 2017-09-18 LAB — POCT INR: INR: 1.9 — AB (ref 0.9–1.1)

## 2017-09-19 ENCOUNTER — Non-Acute Institutional Stay (SKILLED_NURSING_FACILITY): Payer: Medicare HMO | Admitting: Adult Health

## 2017-09-19 ENCOUNTER — Encounter: Payer: Self-pay | Admitting: Adult Health

## 2017-09-19 DIAGNOSIS — Z7901 Long term (current) use of anticoagulants: Secondary | ICD-10-CM | POA: Diagnosis not present

## 2017-09-19 DIAGNOSIS — I4891 Unspecified atrial fibrillation: Secondary | ICD-10-CM | POA: Diagnosis not present

## 2017-09-19 NOTE — Progress Notes (Signed)
Location: [facility] Room Number: 119-A Place of Service:   snf   CODE STATUS: DNR  Allergies  Allergen Reactions  . Foradil [Formoterol] Other (See Comments)    Reaction not recalled (??)    Chief Complaint  Patient presents with  . Acute Visit    INR    HPI:  She is on long term coumadin therapy due to her chronic atrial fibrillation. There are no reports of any missed doses of coumadin. She is tolerating this medication without issues present. No signs of bleeding present. No complaints of chest pain; shortness of breath. There are no nursing concerns at this time.   Past Medical History:  Diagnosis Date  . Accident due to mechanical fall without injury   . Chronic systolic heart failure ([place])   . Dementia   . HLD (hyperlipidemia)   . HTN (hypertension)   . Hypertensive left ventricular hypertrophy with heart failure ([place])   . Hypothyroidism   . Lower extremity edema   . Osteoporosis   . Permanent atrial fibrillation ([place])   . Pulmonary hypertension ([place])    dx'd by right heart cath/Croituro  . Thoracic compression fracture [facility])     Past Surgical History:  Procedure Laterality Date  . BREAST LUMPECTOMY  3818   left  . CARDIAC CATHETERIZATION  06/13/2010   R & LHC: no CAD  . CATARACT EXTRACTION  2008   bilateral  . [place]   left  . US ECHOCARDIOGRAPHY  02/04/2012   LA mod. dilated,RA mild-mod dilated,mild MR,mod. TR    Social History   Socioeconomic History  . Marital status: Widowed    Spouse name: Not on file  . Number of children: Not on file  . Years of education: Not on file  . Highest education level: Not on file  Social Needs  . Financial resource strain: Not on file  . Food insecurity - worry: Not on file  . Food insecurity - inability: Not on file  . Transportation needs - medical: Not on file  . Transportation needs - non-medical: Not on file  Occupational History  . Not on file  Tobacco Use  . Smoking  status: Never Smoker  . Smokeless tobacco: Never Used  Substance and Sexual Activity  . Alcohol use: Yes    Comment: seldom; 03/17/2013-occasionally drinks a glass of wine before bed  . Drug use: No  . Sexual activity: Not on file  Other Topics Concern  . Not on file  Social History Narrative  . Not on file   Family History  Problem Relation Age of Onset  . Heart attack Mother   . Heart attack Father   . Diabetes Brother   . Cancer Brother       VITAL SIGNS There were no vitals taken for this visit.  Outpatient Encounter Medications as of 09/19/2017  Medication Sig  . acetaminophen (TYLENOL) 325 MG tablet Take 650 mg by mouth every 6 (six) hours as needed for moderate pain.   Marland Kitchen atorvastatin (LIPITOR) 20 MG tablet Take 20 mg by mouth daily.  . Calcium Carb-Cholecalciferol (CALCIUM + D3) 600-200 MG-UNIT TABS Take 1 tablet by mouth daily.   . collagenase (SANTYL) ointment Apply topically daily. Apply Santyl to sacrum and left buttock wound Q day, then cover with moist gauze and foam dressing.  (Change foam dressing Q 3 days or PRN soiling.)  . digoxin (LANOXIN) 0.125 MG tablet Take 1 tablet (0.125 mg total) by mouth daily.  Marland Kitchen  escitalopram (LEXAPRO) 10 MG tablet 10mg  by mouth once daily  . feeding supplement, ENSURE ENLIVE, (ENSURE ENLIVE) LIQD Take 237 mLs by mouth 3 (three) times daily between meals.  Marland Kitchen levothyroxine (SYNTHROID, LEVOTHROID) 137 MCG tablet Take 137 mcg by mouth daily before breakfast.  . metoprolol succinate (TOPROL-XL) 100 MG 24 hr tablet Take 1 tablet (100 mg total) by mouth daily. Take with or immediately following a meal.  . Multiple Vitamin (MULTIVITAMIN WITH MINERALS) TABS tablet Take 1 tablet by mouth daily.  . OXYGEN Inhale 2 L into the lungs continuous.  Derrill Memo ON 11/14/2017] PROLIA 60 MG/ML SOLN injection Inject 60 mg into the skin every 6 (six) months.  . warfarin (COUMADIN) 2 MG tablet 2mg  by mouth once daily   No facility-administered encounter  medications on file as of 09/19/2017.      SIGNIFICANT DIAGNOSTIC EXAMS   PREVIOUS:   09-04-17: 2-d echo:   - Left ventricle: The cavity size was normal. Systolic function was mildly reduced. The estimated ejection fraction was in the range of 45% to 50%. Wall motion was normal; there were no regional wall motion abnormalities. - Ventricular septum: The contour showed systolic flattening. These changes are consistent with RV pressure overload. - Mitral valve: Mildly calcified, moderately fibrotic annulus. There was moderate regurgitation. - Left atrium: The atrium was moderately dilated. - Right ventricle: The cavity size was moderately dilated. Wall thickness was normal. Systolic function was reduced. - Right atrium: The atrium was severely dilated. - Tricuspid valve: There was moderate regurgitation. - Pulmonary arteries: Systolic pressure was moderately increased. PA peak pressure: 60 mm Hg (S).  09-04-17: ct of head and cervical spine:  CT BRAIN: 1. No acute intracranial process. 2. Moderate generalized age-related cerebral atrophy with moderate chronic small vessel ischemic disease.  CT CERVICAL SPINE: 1. No acute cervical spine injury identified. 2. Acute to subacute compression fracture involving the T3 vertebral body with up to 70% height loss and 3 mm bony retropulsion. There was a mild compression deformity at this level on prior study from 08/11/2017, suggesting that this may be subacute in nature. Correlation with physical exam recommended.  09-04-17: ct angio of chest: 1. No evidence of significant pulmonary embolus. 2. Diffuse cardiac enlargement. 3. Bilateral pleural effusions and airspace infiltrates likely representing edema due to congestive failure. Pneumonia could also have this appearance. 4. Multiple thoracic compression fractures likely indicating osteoporosis. Progression of compression at T3 since the previous study. 5. Multiple hepatic cysts. Aortic  Atherosclerosis     NO NEW EXAMS   LABS REVIEWED: PREVIOUS   09-04-17: wbc 9.2; hgb 14.5; hct 44.8; mcv 87.2; plt 262; glucose 140; bun 24; creat 1.14; k+ 4.2; n++ 135; CA 9.8; TSH 2.545; BNP 285.3; urine culture; no growth 09-05-17: chol 113; ldl 64; trig 49; hdl 39 09-07-17: wbc 7.8; hgb 13.0; hcy 41.8; mcv 88.9 ;plt 196 09-09-17: glucose 104; bun 16; creat 0.64; k+ 4.8; na++ 134; ca 9.3    TODAY;   09-18-17: INR 1.9 coumadin 2 mg    Review of Systems  Constitutional: Negative for malaise/fatigue.  Respiratory: Negative for cough and shortness of breath.   Cardiovascular: Negative for chest pain, palpitations and leg swelling.  Gastrointestinal: Negative for abdominal pain, constipation and heartburn.  Musculoskeletal: Positive for back pain. Negative for joint pain and myalgias.  Skin: Negative.   Neurological: Negative for dizziness.  Psychiatric/Behavioral: The patient is not nervous/anxious.     Physical Exam  Constitutional: She is oriented to person, place,  and time. She appears well-developed and well-nourished. No distress.  Neck: No thyromegaly present.  Cardiovascular: Normal rate and intact distal pulses.  Murmur heard. 1/6 Heart rate irregular irregular   Pulmonary/Chest: Effort normal and breath sounds normal. No respiratory distress.  Abdominal: Soft. Bowel sounds are normal. She exhibits no distension. There is no tenderness.  Musculoskeletal: She exhibits edema.  Trace lower extremity edema Able to move all extremities Kyphosis   Lymphadenopathy:    She has no cervical adenopathy.  Neurological: She is alert and oriented to person, place, and time.  Skin: Skin is warm and dry. She is not diaphoretic.  Psychiatric: She has a normal mood and affect.      ASSESSMENT/ PLAN:  TODAY:   1. Atrial fibrillation with rvr 2. Chronic anticoagulation   For INR 1.9 will continue her coumadin 2 mg every evening and will check INR on 09-25-17.    MD is aware of  resident's narcotic use and is in agreement with current plan of care. We will attempt to wean resident as apropriate   Ok Edwards NP [facility]  Contact [phone] Monday through Friday 8am- 5pm  After hours call [phone]

## 2017-09-23 ENCOUNTER — Non-Acute Institutional Stay (SKILLED_NURSING_FACILITY): Payer: Medicare HMO | Admitting: Adult Health

## 2017-09-23 DIAGNOSIS — F329 Major depressive disorder, single episode, unspecified: Secondary | ICD-10-CM | POA: Diagnosis not present

## 2017-09-23 DIAGNOSIS — I5022 Chronic systolic (congestive) heart failure: Secondary | ICD-10-CM

## 2017-09-23 DIAGNOSIS — E785 Hyperlipidemia, unspecified: Secondary | ICD-10-CM

## 2017-09-23 DIAGNOSIS — R69 Illness, unspecified: Secondary | ICD-10-CM | POA: Diagnosis not present

## 2017-09-23 DIAGNOSIS — R630 Anorexia: Secondary | ICD-10-CM | POA: Diagnosis not present

## 2017-09-24 ENCOUNTER — Encounter: Payer: Self-pay | Admitting: Adult Health

## 2017-09-24 NOTE — Progress Notes (Signed)
Location:   [facility] Room Number: 628 Place of Service:  SNF (31)   CODE STATUS: dnr  Allergies  Allergen Reactions  . Foradil [Formoterol] Other (See Comments)    Reaction not recalled (??)    Chief Complaint  Patient presents with  . Acute Visit    care plan meeting     HPI:  Her appetite has decreased. She requires cueing for her adls including dressing. She will need 24 hour supervision for her safety. Her family is present for the care plan meeting. He is in agreement with the plan of care to have her move to assisted living when she is able. He is concerned that she is getting depressed. She is presently taking lexapro 10 mg daily. Her MOST form has been filled out.  There are no reports of uncontrolled pain; no reports of insomnia present.   Past Medical History:  Diagnosis Date  . Accident due to mechanical fall without injury   . Chronic systolic heart failure ([place])   . Dementia   . HLD (hyperlipidemia)   . HTN (hypertension)   . Hypertensive left ventricular hypertrophy with heart failure ([place])   . Hypothyroidism   . Lower extremity edema   . Osteoporosis   . Permanent atrial fibrillation ([place])   . Pulmonary hypertension ([place])    dx'd by right heart cath/Croituro  . Thoracic compression fracture [facility])     Past Surgical History:  Procedure Laterality Date  . BREAST LUMPECTOMY  3151   left  . CARDIAC CATHETERIZATION  06/13/2010   R & LHC: no CAD  . CATARACT EXTRACTION  2008   bilateral  . [place]   left  . US ECHOCARDIOGRAPHY  02/04/2012   LA mod. dilated,RA mild-mod dilated,mild MR,mod. TR    Social History   Socioeconomic History  . Marital status: Widowed    Spouse name: Not on file  . Number of children: Not on file  . Years of education: Not on file  . Highest education level: Not on file  Social Needs  . Financial resource strain: Not on file  . Food insecurity - worry: Not on file  . Food insecurity -  inability: Not on file  . Transportation needs - medical: Not on file  . Transportation needs - non-medical: Not on file  Occupational History  . Not on file  Tobacco Use  . Smoking status: Never Smoker  . Smokeless tobacco: Never Used  Substance and Sexual Activity  . Alcohol use: Yes    Comment: seldom; 03/17/2013-occasionally drinks a glass of wine before bed  . Drug use: No  . Sexual activity: Not on file  Other Topics Concern  . Not on file  Social History Narrative  . Not on file   Family History  Problem Relation Age of Onset  . Heart attack Mother   . Heart attack Father   . Diabetes Brother   . Cancer Brother       VITAL SIGNS BP 122/65   Pulse (!) 110   Temp (!) 96 F (35.6 C)   Resp 18   Ht 5\' 4"  (1.626 m)   Wt 119 lb (54 kg)   SpO2 96%   BMI 20.43 kg/m    Outpatient Encounter Medications as of 09/23/2017  Medication Sig  . acetaminophen (TYLENOL) 325 MG tablet Take 650 mg by mouth every 6 (six) hours as needed for moderate pain.   Marland Kitchen atorvastatin (LIPITOR) 20 MG  tablet Take 20 mg by mouth daily.  . Calcium Carb-Cholecalciferol (CALCIUM + D3) 600-200 MG-UNIT TABS Take 1 tablet by mouth daily.   . collagenase (SANTYL) ointment Apply topically daily. Apply Santyl to sacrum and left buttock wound Q day, then cover with moist gauze and foam dressing.  (Change foam dressing Q 3 days or PRN soiling.)  . digoxin (LANOXIN) 0.125 MG tablet Take 1 tablet (0.125 mg total) by mouth daily.  Marland Kitchen escitalopram (LEXAPRO) 10 MG tablet 10mg  by mouth once daily  . feeding supplement, ENSURE ENLIVE, (ENSURE ENLIVE) LIQD Take 237 mLs by mouth 3 (three) times daily between meals.  Marland Kitchen levothyroxine (SYNTHROID, LEVOTHROID) 137 MCG tablet Take 137 mcg by mouth daily before breakfast.  . metoprolol succinate (TOPROL-XL) 100 MG 24 hr tablet Take 1 tablet (100 mg total) by mouth daily. Take with or immediately following a meal.  . Multiple Vitamin (MULTIVITAMIN WITH MINERALS) TABS tablet  Take 1 tablet by mouth daily.  . OXYGEN Inhale 2 L into the lungs continuous.  Derrill Memo ON 11/14/2017] PROLIA 60 MG/ML SOLN injection Inject 60 mg into the skin every 6 (six) months.  . warfarin (COUMADIN) 2 MG tablet 2mg  by mouth once daily   No facility-administered encounter medications on file as of 09/23/2017.      SIGNIFICANT DIAGNOSTIC EXAMS  PREVIOUS:   09-04-17: 2-d echo:   - Left ventricle: The cavity size was normal. Systolic function was mildly reduced. The estimated ejection fraction was in the range of 45% to 50%. Wall motion was normal; there were no regional wall motion abnormalities. - Ventricular septum: The contour showed systolic flattening. These changes are consistent with RV pressure overload. - Mitral valve: Mildly calcified, moderately fibrotic annulus. There was moderate regurgitation. - Left atrium: The atrium was moderately dilated. - Right ventricle: The cavity size was moderately dilated. Wall thickness was normal. Systolic function was reduced. - Right atrium: The atrium was severely dilated. - Tricuspid valve: There was moderate regurgitation. - Pulmonary arteries: Systolic pressure was moderately increased. PA peak pressure: 60 mm Hg (S).  09-04-17: ct of head and cervical spine:  CT BRAIN: 1. No acute intracranial process. 2. Moderate generalized age-related cerebral atrophy with moderate chronic small vessel ischemic disease.  CT CERVICAL SPINE: 1. No acute cervical spine injury identified. 2. Acute to subacute compression fracture involving the T3 vertebral body with up to 70% height loss and 3 mm bony retropulsion. There was a mild compression deformity at this level on prior study from 08/11/2017, suggesting that this may be subacute in nature. Correlation with physical exam recommended.  09-04-17: ct angio of chest: 1. No evidence of significant pulmonary embolus. 2. Diffuse cardiac enlargement. 3. Bilateral pleural effusions and airspace infiltrates  likely representing edema due to congestive failure. Pneumonia could also have this appearance. 4. Multiple thoracic compression fractures likely indicating osteoporosis. Progression of compression at T3 since the previous study. 5. Multiple hepatic cysts. Aortic Atherosclerosis     NO NEW EXAMS   LABS REVIEWED: PREVIOUS   09-04-17: wbc 9.2; hgb 14.5; hct 44.8; mcv 87.2; plt 262; glucose 140; bun 24; creat 1.14; k+ 4.2; n++ 135; CA 9.8; TSH 2.545; BNP 285.3; urine culture; no growth 09-05-17: chol 113; ldl 64; trig 49; hdl 39 09-07-17: wbc 7.8; hgb 13.0; hcy 41.8; mcv 88.9 ;plt 196 09-09-17: glucose 104; bun 16; creat 0.64; k+ 4.8; na++ 134; ca 9.3  09-18-17: INR 1.9 coumadin 2 mg   NO NEW LABS     Review  of Systems  Constitutional: Negative for malaise/fatigue.  Respiratory: Negative for cough and shortness of breath.   Cardiovascular: Negative for chest pain, palpitations and leg swelling.  Gastrointestinal: Negative for abdominal pain, constipation and heartburn.  Musculoskeletal: Negative for back pain, joint pain and myalgias.  Skin: Negative.   Neurological: Negative for dizziness.  Psychiatric/Behavioral: The patient is not nervous/anxious.       Physical Exam  Constitutional: She is oriented to person, place, and time. She appears well-developed. No distress.  Cardiovascular: Normal rate and intact distal pulses.  Murmur heard. 1/6 Heart rate irregular irregular   Pulmonary/Chest: Effort normal and breath sounds normal. No respiratory distress.  Abdominal: Soft. Bowel sounds are normal. There is no tenderness.  Musculoskeletal: She exhibits edema.  Trace lower extremity edema Able to move all extremities Kyphosis   Lymphadenopathy:    She has no cervical adenopathy.  Neurological: She is alert and oriented to person, place, and time.  Skin: Skin is warm and dry. She is not diaphoretic.  Psychiatric: She has a normal mood and affect.     ASSESSMENT/ PLAN:  TODAY:     1. Chronic systolic heart failure 2. Restrictive lung disease due to kyphoscoliosis 3. Loss of appetite 4. Dyslipidemia 5. depression  Will stop lipitor due to poor appetite Will check dig level due to poor appetite Will increase lexapro to 20 mg daily Will begin remeron 7.5 mg nightly for 30 days to help with appetite.    Time spent with patient and family: 45 minutes: discussed overall status; therapy goals and expectations; and future plan of care. MOST for filled out. Verbalized understanding.    MD is aware of resident's narcotic use and is in agreement with current plan of care. We will attempt to wean resident as apropriate   Ok Edwards NP [facility]  Contact [phone] Monday through Friday 8am- 5pm  After hours call [phone]

## 2017-09-25 ENCOUNTER — Encounter: Payer: Self-pay | Admitting: Adult Health

## 2017-09-25 ENCOUNTER — Non-Acute Institutional Stay (SKILLED_NURSING_FACILITY): Payer: Medicare HMO | Admitting: Adult Health

## 2017-09-25 DIAGNOSIS — F32A Depression, unspecified: Secondary | ICD-10-CM | POA: Insufficient documentation

## 2017-09-25 DIAGNOSIS — I4891 Unspecified atrial fibrillation: Secondary | ICD-10-CM | POA: Diagnosis not present

## 2017-09-25 DIAGNOSIS — R630 Anorexia: Secondary | ICD-10-CM | POA: Insufficient documentation

## 2017-09-25 DIAGNOSIS — F329 Major depressive disorder, single episode, unspecified: Secondary | ICD-10-CM | POA: Insufficient documentation

## 2017-09-25 DIAGNOSIS — Z7901 Long term (current) use of anticoagulants: Secondary | ICD-10-CM

## 2017-09-25 LAB — PROTIME-INR: PROTIME: 25.4 — AB (ref 10.0–13.8)

## 2017-09-25 LAB — POCT INR: INR: 2.4 — AB (ref 0.9–1.1)

## 2017-09-25 NOTE — Progress Notes (Signed)
Location:   [facility] Room Number: 117 Place of Service:  SNF (31)   CODE STATUS: dnr  Allergies  Allergen Reactions  . Foradil [Formoterol] Other (See Comments)    Reaction not recalled (??)    Chief Complaint  Patient presents with  . Acute Visit    INR management     HPI:  She is on chronic coumadin therapy for her afib. There are no reports of missed doses. She is tolerating coumadin without difficulty. There are no reports of bleeding present. There are no complaints of chest pain; shortness of breath; and palpations. There are no nursing concerns at this time.   Past Medical History:  Diagnosis Date  . Accident due to mechanical fall without injury   . Chronic systolic heart failure ([place])   . Dementia   . HLD (hyperlipidemia)   . HTN (hypertension)   . Hypertensive left ventricular hypertrophy with heart failure ([place])   . Hypothyroidism   . Lower extremity edema   . Osteoporosis   . Permanent atrial fibrillation ([place])   . Pulmonary hypertension ([place])    dx'd by right heart cath/Croituro  . Thoracic compression fracture [facility])     Past Surgical History:  Procedure Laterality Date  . BREAST LUMPECTOMY  1610   left  . CARDIAC CATHETERIZATION  06/13/2010   R & LHC: no CAD  . CATARACT EXTRACTION  2008   bilateral  . [place]   left  . US ECHOCARDIOGRAPHY  02/04/2012   LA mod. dilated,RA mild-mod dilated,mild MR,mod. TR    Social History   Socioeconomic History  . Marital status: Widowed    Spouse name: Not on file  . Number of children: Not on file  . Years of education: Not on file  . Highest education level: Not on file  Social Needs  . Financial resource strain: Not on file  . Food insecurity - worry: Not on file  . Food insecurity - inability: Not on file  . Transportation needs - medical: Not on file  . Transportation needs - non-medical: Not on file  Occupational History  . Not on file  Tobacco Use  . Smoking  status: Never Smoker  . Smokeless tobacco: Never Used  Substance and Sexual Activity  . Alcohol use: Yes    Comment: seldom; 03/17/2013-occasionally drinks a glass of wine before bed  . Drug use: No  . Sexual activity: Not on file  Other Topics Concern  . Not on file  Social History Narrative  . Not on file   Family History  Problem Relation Age of Onset  . Heart attack Mother   . Heart attack Father   . Diabetes Brother   . Cancer Brother       VITAL SIGNS BP (!) 148/90   Pulse 98   Temp 97.6 F (36.4 C)   Resp 18   Ht 5\' 4"  (1.626 m)   Wt 119 lb (54 kg)   SpO2 95%   BMI 20.43 kg/m   Outpatient Encounter Medications as of 09/25/2017  Medication Sig  . escitalopram (LEXAPRO) 20 MG tablet Take 20 mg by mouth daily.  . mirtazapine (REMERON) 7.5 MG tablet Take 7.5 mg by mouth at bedtime.  Marland Kitchen acetaminophen (TYLENOL) 325 MG tablet Take 650 mg by mouth every 6 (six) hours as needed for moderate pain.   . Calcium Carb-Cholecalciferol (CALCIUM + D3) 600-200 MG-UNIT TABS Take 1 tablet by mouth daily.   Marland Kitchen  collagenase (SANTYL) ointment Apply topically daily. Apply Santyl to sacrum and left buttock wound Q day, then cover with moist gauze and foam dressing.  (Change foam dressing Q 3 days or PRN soiling.)  . digoxin (LANOXIN) 0.125 MG tablet Take 1 tablet (0.125 mg total) by mouth daily.  . feeding supplement, ENSURE ENLIVE, (ENSURE ENLIVE) LIQD Take 237 mLs by mouth 3 (three) times daily between meals.  Marland Kitchen levothyroxine (SYNTHROID, LEVOTHROID) 137 MCG tablet Take 137 mcg by mouth daily before breakfast.  . metoprolol succinate (TOPROL-XL) 100 MG 24 hr tablet Take 1 tablet (100 mg total) by mouth daily. Take with or immediately following a meal.  . Multiple Vitamin (MULTIVITAMIN WITH MINERALS) TABS tablet Take 1 tablet by mouth daily.  . OXYGEN Inhale 2 L into the lungs continuous.  Derrill Memo ON 11/14/2017] PROLIA 60 MG/ML SOLN injection Inject 60 mg into the skin every 6 (six) months.  .  warfarin (COUMADIN) 2 MG tablet 2mg  by mouth once daily  . [DISCONTINUED] atorvastatin (LIPITOR) 20 MG tablet Take 20 mg by mouth daily.  . [DISCONTINUED] escitalopram (LEXAPRO) 10 MG tablet 10mg  by mouth once daily   No facility-administered encounter medications on file as of 09/25/2017.      SIGNIFICANT DIAGNOSTIC EXAMS   PREVIOUS:   09-04-17: 2-d echo:   - Left ventricle: The cavity size was normal. Systolic function was mildly reduced. The estimated ejection fraction was in the range of 45% to 50%. Wall motion was normal; there were no regional wall motion abnormalities. - Ventricular septum: The contour showed systolic flattening. These changes are consistent with RV pressure overload. - Mitral valve: Mildly calcified, moderately fibrotic annulus. There was moderate regurgitation. - Left atrium: The atrium was moderately dilated. - Right ventricle: The cavity size was moderately dilated. Wall thickness was normal. Systolic function was reduced. - Right atrium: The atrium was severely dilated. - Tricuspid valve: There was moderate regurgitation. - Pulmonary arteries: Systolic pressure was moderately increased. PA peak pressure: 60 mm Hg (S).  09-04-17: ct of head and cervical spine:  CT BRAIN: 1. No acute intracranial process. 2. Moderate generalized age-related cerebral atrophy with moderate chronic small vessel ischemic disease.  CT CERVICAL SPINE: 1. No acute cervical spine injury identified. 2. Acute to subacute compression fracture involving the T3 vertebral body with up to 70% height loss and 3 mm bony retropulsion. There was a mild compression deformity at this level on prior study from 08/11/2017, suggesting that this may be subacute in nature. Correlation with physical exam recommended.  09-04-17: ct angio of chest: 1. No evidence of significant pulmonary embolus. 2. Diffuse cardiac enlargement. 3. Bilateral pleural effusions and airspace infiltrates likely representing  edema due to congestive failure. Pneumonia could also have this appearance. 4. Multiple thoracic compression fractures likely indicating osteoporosis. Progression of compression at T3 since the previous study. 5. Multiple hepatic cysts. Aortic Atherosclerosis     NO NEW EXAMS   LABS REVIEWED: PREVIOUS   09-04-17: wbc 9.2; hgb 14.5; hct 44.8; mcv 87.2; plt 262; glucose 140; bun 24; creat 1.14; k+ 4.2; n++ 135; CA 9.8; TSH 2.545; BNP 285.3; urine culture; no growth 09-05-17: chol 113; ldl 64; trig 49; hdl 39 09-07-17: wbc 7.8; hgb 13.0; hcy 41.8; mcv 88.9 ;plt 196 09-09-17: glucose 104; bun 16; creat 0.64; k+ 4.8; na++ 134; ca 9.3  09-18-17: INR 1.9 coumadin 2 mg   TODAY:   09-25-17: dig 0.98; INR 2.4 on coumadin 2 mg      Review  of Systems  Constitutional: Negative for malaise/fatigue.  Respiratory: Negative for cough and shortness of breath.   Cardiovascular: Negative for chest pain, palpitations and leg swelling.  Gastrointestinal: Negative for abdominal pain, constipation and heartburn.  Musculoskeletal: Negative for back pain, joint pain and myalgias.  Skin: Negative.   Neurological: Negative for dizziness.  Psychiatric/Behavioral: The patient is not nervous/anxious.       Physical Exam  Constitutional: She is oriented to person, place, and time. She appears well-developed. No distress.  Neck: No thyromegaly present.  Cardiovascular: Normal rate and intact distal pulses.  Murmur heard. 1/6 Heart rate irregular irregular   Pulmonary/Chest: Breath sounds normal. No respiratory distress.  02 dependent   Abdominal: Soft. Bowel sounds are normal. She exhibits no distension. There is no tenderness.  Musculoskeletal: She exhibits edema.  Trace lower extremity edema Able to move all extremities Kyphosis   Lymphadenopathy:    She has no cervical adenopathy.  Neurological: She is alert and oriented to person, place, and time.  Skin: Skin is warm and dry. She is not diaphoretic.    Psychiatric: She has a normal mood and affect.    ASSESSMENT/ PLAN:  TODAY:   1. Atrial fibrillation with rvr 2. Chronic anticoagulation  For INR 2.4 will continue coumadin 2 mg daily and will check INR on 10-09-17.   MD is aware of resident's narcotic use and is in agreement with current plan of care. We will attempt to wean resident as apropriate   Ok Edwards NP [facility]  Contact [phone] Monday through Friday 8am- 5pm  After hours call [phone]

## 2017-09-30 ENCOUNTER — Encounter: Payer: Self-pay | Admitting: Adult Health

## 2017-09-30 ENCOUNTER — Non-Acute Institutional Stay (SKILLED_NURSING_FACILITY): Payer: Medicare HMO | Admitting: Adult Health

## 2017-09-30 DIAGNOSIS — J984 Other disorders of lung: Secondary | ICD-10-CM

## 2017-09-30 DIAGNOSIS — M419 Scoliosis, unspecified: Secondary | ICD-10-CM

## 2017-09-30 DIAGNOSIS — I4891 Unspecified atrial fibrillation: Secondary | ICD-10-CM | POA: Diagnosis not present

## 2017-09-30 DIAGNOSIS — I5023 Acute on chronic systolic (congestive) heart failure: Secondary | ICD-10-CM | POA: Diagnosis not present

## 2017-09-30 NOTE — Progress Notes (Signed)
Location:   [facility] Room Number: 973 Place of Service:  SNF (31)    CODE STATUS: dnr  Allergies  Allergen Reactions  . Foradil [Formoterol] Other (See Comments)    Reaction not recalled (??)    Chief Complaint  Patient presents with  . Discharge Note    to ALF     HPI:  She is being discharged to ALF. She will need semi-electric hospital bed. She will need continuous and portable 02 at 2L/[place]. She will follow up medically with the provider at the receiving facility. Her medications will be provided at the ALF.  She had been hospitalized for acute respiratory failure; and acute on chronic heart failure. She was admitted to this facility for short term rehab. She is unable to return home safely and is now ready for discharge to assisted living.     Past Medical History:  Diagnosis Date  . Accident due to mechanical fall without injury   . Chronic systolic heart failure ([place])   . Dementia   . HLD (hyperlipidemia)   . HTN (hypertension)   . Hypertensive left ventricular hypertrophy with heart failure ([place])   . Hypothyroidism   . Lower extremity edema   . Osteoporosis   . Permanent atrial fibrillation ([place])   . Pulmonary hypertension ([place])    dx'd by right heart cath/[place]uro  . Thoracic compression fracture [facility])     Past Surgical History:  Procedure Laterality Date  . BREAST LUMPECTOMY  5329   left  . CARDIAC CATHETERIZATION  06/13/2010   R & LHC: no CAD  . CATARACT EXTRACTION  2008   bilateral  . [place]   left  . US ECHOCARDIOGRAPHY  02/04/2012   LA mod. dilated,RA mild-mod dilated,mild MR,mod. TR    Social History   Socioeconomic History  . Marital status: Widowed    Spouse name: Not on file  . Number of children: Not on file  . Years of education: Not on file  . Highest education level: Not on file  Social Needs  . Financial resource strain: Not on file  . Food insecurity - worry: Not on file  . Food insecurity -  inability: Not on file  . Transportation needs - medical: Not on file  . Transportation needs - non-medical: Not on file  Occupational History  . Not on file  Tobacco Use  . Smoking status: Never Smoker  . Smokeless tobacco: Never Used  Substance and Sexual Activity  . Alcohol use: Yes    Comment: seldom; 03/17/2013-occasionally drinks a glass of wine before bed  . Drug use: No  . Sexual activity: Not on file  Other Topics Concern  . Not on file  Social History Narrative  . Not on file   Family History  Problem Relation Age of Onset  . Heart attack Mother   . Heart attack Father   . Diabetes Brother   . Cancer Brother     VITAL SIGNS BP 129/89   Pulse 88   Temp (!) 97 F (36.1 C)   Resp 16   Ht 5\' 4"  (1.626 m)   Wt 117 lb (53.1 kg)   SpO2 94%   BMI 20.08 kg/m    Patient's Medications  New Prescriptions   No medications on file  Previous Medications   ACETAMINOPHEN (TYLENOL) 325 MG TABLET    Take 650 mg by mouth every 6 (six) hours as needed for moderate pain.    CALCIUM  CARB-CHOLECALCIFEROL (CALCIUM + D3) 600-200 MG-UNIT TABS    Take 1 tablet by mouth daily.    COLLAGENASE (SANTYL) OINTMENT    Apply topically daily. Apply Santyl to sacrum and left buttock wound Q day, then cover with moist gauze and foam dressing.  (Change foam dressing Q 3 days or PRN soiling.)   DIGOXIN (LANOXIN) 0.125 MG TABLET    Take 1 tablet (0.125 mg total) by mouth daily.   ESCITALOPRAM (LEXAPRO) 20 MG TABLET    Take 20 mg by mouth daily.   FEEDING SUPPLEMENT, ENSURE ENLIVE, (ENSURE ENLIVE) LIQD    Take 237 mLs by mouth 3 (three) times daily between meals.   LEVOTHYROXINE (SYNTHROID, LEVOTHROID) 137 MCG TABLET    Take 137 mcg by mouth daily before breakfast.   METOPROLOL SUCCINATE (TOPROL-XL) 100 MG 24 HR TABLET    Take 1 tablet (100 mg total) by mouth daily. Take with or immediately following a meal.   MIRTAZAPINE (REMERON) 7.5 MG TABLET    Take 7.5 mg by mouth at bedtime.   MULTIPLE VITAMIN  (MULTIVITAMIN WITH MINERALS) TABS TABLET    Take 1 tablet by mouth daily.   OXYGEN    Inhale 2 L into the lungs continuous.   PROLIA 60 MG/ML SOLN INJECTION    Inject 60 mg into the skin every 6 (six) months.   WARFARIN (COUMADIN) 2 MG TABLET    2mg  by mouth once daily  Modified Medications   No medications on file  Discontinued Medications   No medications on file     SIGNIFICANT DIAGNOSTIC EXAMS  PREVIOUS:   09-04-17: 2-d echo:   - Left ventricle: The cavity size was normal. Systolic function was mildly reduced. The estimated ejection fraction was in the range of 45% to 50%. Wall motion was normal; there were no regional wall motion abnormalities. - Ventricular septum: The contour showed systolic flattening. These changes are consistent with RV pressure overload. - Mitral valve: Mildly calcified, moderately fibrotic annulus. There was moderate regurgitation. - Left atrium: The atrium was moderately dilated. - Right ventricle: The cavity size was moderately dilated. Wall thickness was normal. Systolic function was reduced. - Right atrium: The atrium was severely dilated. - Tricuspid valve: There was moderate regurgitation. - Pulmonary arteries: Systolic pressure was moderately increased. PA peak pressure: 60 mm Hg (S).  09-04-17: ct of head and cervical spine:  CT BRAIN: 1. No acute intracranial process. 2. Moderate generalized age-related cerebral atrophy with moderate chronic small vessel ischemic disease.  CT CERVICAL SPINE: 1. No acute cervical spine injury identified. 2. Acute to subacute compression fracture involving the T3 vertebral body with up to 70% height loss and 3 mm bony retropulsion. There was a mild compression deformity at this level on prior study from 08/11/2017, suggesting that this may be subacute in nature. Correlation with physical exam recommended.  09-04-17: ct angio of chest: 1. No evidence of significant pulmonary embolus. 2. Diffuse cardiac  enlargement. 3. Bilateral pleural effusions and airspace infiltrates likely representing edema due to congestive failure. Pneumonia could also have this appearance. 4. Multiple thoracic compression fractures likely indicating osteoporosis. Progression of compression at T3 since the previous study. 5. Multiple hepatic cysts. Aortic Atherosclerosis     NO NEW EXAMS   LABS REVIEWED: PREVIOUS   09-04-17: wbc 9.2; hgb 14.5; hct 44.8; mcv 87.2; plt 262; glucose 140; bun 24; creat 1.14; k+ 4.2; n++ 135; CA 9.8; TSH 2.545; BNP 285.3; urine culture; no growth 09-05-17: chol 113; ldl 64; trig  49; hdl 39 09-07-17: wbc 7.8; hgb 13.0; hcy 41.8; mcv 88.9 ;plt 196 09-09-17: glucose 104; bun 16; creat 0.64; k+ 4.8; na++ 134; ca 9.3  09-18-17: INR 1.9 coumadin 2 mg  09-25-17: dig 0.98; INR 2.4 on coumadin 2 mg  NO NEW LABS      Review of Systems  Constitutional: Negative for malaise/fatigue.  Respiratory: Negative for cough and shortness of breath.   Cardiovascular: Negative for chest pain, palpitations and leg swelling.  Gastrointestinal: Negative for abdominal pain, constipation and heartburn.  Musculoskeletal: Negative for back pain, joint pain and myalgias.  Skin: Negative.   Neurological: Negative for dizziness.  Psychiatric/Behavioral: The patient is not nervous/anxious.      Physical Exam  Constitutional: She is oriented to person, place, and time. She appears well-developed and well-nourished. No distress.  Neck: No thyromegaly present.  Cardiovascular: Normal rate and intact distal pulses.  Murmur heard. 1/6  Heart rate irregular irregular   Pulmonary/Chest: Effort normal and breath sounds normal. No respiratory distress.  02 dependent   Abdominal: Soft. Bowel sounds are normal. She exhibits no distension. There is no tenderness.  Musculoskeletal: She exhibits edema.  Trace lower extremity edema Able to move all extremities Kyphosis   Lymphadenopathy:    She has no cervical  adenopathy.  Neurological: She is alert and oriented to person, place, and time.  Skin: Skin is warm and dry. She is not diaphoretic.  Psychiatric: She has a normal mood and affect.    ASSESSMENT/ PLAN:  Patient is being discharged with the following home health services:  Pt/ot: to evaluate and treat as indicated for gait balance strength adl training   Patient is being discharged with the following durable medical equipment:  02at 2L/[place] continuous and portable to maintain 02 sat >90% on room air sat is 83%.  Semi-electric bed patient with chronic systolic heart failure and requires hob elevated >30% in order to maintain 02 sat >90% which cannot be done in a standard bed   Patient has been advised to f/u with their PCP in 1-2 weeks to bring them up to date on their rehab stay.  Social services at facility was responsible for arranging this appointment.  Pt was provided with a 30 day supply of prescriptions for medications and refills must be obtained from their PCP.  For controlled substances, a more limited supply may be provided adequate until PCP appointment only.  Patient has no narcotic Receiving facility will provide medications per list as above  Will need INR on 10-09-17.   Time spent with patient 45 minutes: discussed discharge needs; dme and home health. Expectations upon discharge and medication regimen. Verbalized understanding.    Ok Edwards NP [facility]  Contact [phone] Monday through Friday 8am- 5pm  After hours call [phone]

## 2017-10-06 DIAGNOSIS — Z9981 Dependence on supplemental oxygen: Secondary | ICD-10-CM | POA: Diagnosis not present

## 2017-10-06 DIAGNOSIS — I481 Persistent atrial fibrillation: Secondary | ICD-10-CM | POA: Diagnosis not present

## 2017-10-06 DIAGNOSIS — R69 Illness, unspecified: Secondary | ICD-10-CM | POA: Diagnosis not present

## 2017-10-06 DIAGNOSIS — R600 Localized edema: Secondary | ICD-10-CM | POA: Diagnosis not present

## 2017-10-06 DIAGNOSIS — E039 Hypothyroidism, unspecified: Secondary | ICD-10-CM | POA: Diagnosis not present

## 2017-10-06 DIAGNOSIS — R269 Unspecified abnormalities of gait and mobility: Secondary | ICD-10-CM | POA: Diagnosis not present

## 2017-10-06 DIAGNOSIS — I509 Heart failure, unspecified: Secondary | ICD-10-CM | POA: Diagnosis not present

## 2017-10-07 ENCOUNTER — Ambulatory Visit: Payer: Medicare HMO | Admitting: Neurology

## 2017-10-08 DIAGNOSIS — R41841 Cognitive communication deficit: Secondary | ICD-10-CM | POA: Diagnosis not present

## 2017-10-08 DIAGNOSIS — R69 Illness, unspecified: Secondary | ICD-10-CM | POA: Diagnosis not present

## 2017-10-10 DIAGNOSIS — R41841 Cognitive communication deficit: Secondary | ICD-10-CM | POA: Diagnosis not present

## 2017-10-10 DIAGNOSIS — R278 Other lack of coordination: Secondary | ICD-10-CM | POA: Diagnosis not present

## 2017-10-10 DIAGNOSIS — R262 Difficulty in walking, not elsewhere classified: Secondary | ICD-10-CM | POA: Diagnosis not present

## 2017-10-10 DIAGNOSIS — I509 Heart failure, unspecified: Secondary | ICD-10-CM | POA: Diagnosis not present

## 2017-10-10 DIAGNOSIS — R69 Illness, unspecified: Secondary | ICD-10-CM | POA: Diagnosis not present

## 2017-10-10 DIAGNOSIS — M6281 Muscle weakness (generalized): Secondary | ICD-10-CM | POA: Diagnosis not present

## 2017-10-13 DIAGNOSIS — I509 Heart failure, unspecified: Secondary | ICD-10-CM | POA: Diagnosis not present

## 2017-10-13 DIAGNOSIS — M6281 Muscle weakness (generalized): Secondary | ICD-10-CM | POA: Diagnosis not present

## 2017-10-13 DIAGNOSIS — R41841 Cognitive communication deficit: Secondary | ICD-10-CM | POA: Diagnosis not present

## 2017-10-13 DIAGNOSIS — R278 Other lack of coordination: Secondary | ICD-10-CM | POA: Diagnosis not present

## 2017-10-13 DIAGNOSIS — R69 Illness, unspecified: Secondary | ICD-10-CM | POA: Diagnosis not present

## 2017-10-13 DIAGNOSIS — R262 Difficulty in walking, not elsewhere classified: Secondary | ICD-10-CM | POA: Diagnosis not present

## 2017-10-14 DIAGNOSIS — I509 Heart failure, unspecified: Secondary | ICD-10-CM | POA: Diagnosis not present

## 2017-10-14 DIAGNOSIS — R69 Illness, unspecified: Secondary | ICD-10-CM | POA: Diagnosis not present

## 2017-10-14 DIAGNOSIS — I4891 Unspecified atrial fibrillation: Secondary | ICD-10-CM | POA: Diagnosis not present

## 2017-10-14 DIAGNOSIS — M6281 Muscle weakness (generalized): Secondary | ICD-10-CM | POA: Diagnosis not present

## 2017-10-14 DIAGNOSIS — R41841 Cognitive communication deficit: Secondary | ICD-10-CM | POA: Diagnosis not present

## 2017-10-14 DIAGNOSIS — R262 Difficulty in walking, not elsewhere classified: Secondary | ICD-10-CM | POA: Diagnosis not present

## 2017-10-14 DIAGNOSIS — Z79899 Other long term (current) drug therapy: Secondary | ICD-10-CM | POA: Diagnosis not present

## 2017-10-14 DIAGNOSIS — R278 Other lack of coordination: Secondary | ICD-10-CM | POA: Diagnosis not present

## 2017-10-15 DIAGNOSIS — I509 Heart failure, unspecified: Secondary | ICD-10-CM | POA: Diagnosis not present

## 2017-10-15 DIAGNOSIS — R278 Other lack of coordination: Secondary | ICD-10-CM | POA: Diagnosis not present

## 2017-10-15 DIAGNOSIS — R41841 Cognitive communication deficit: Secondary | ICD-10-CM | POA: Diagnosis not present

## 2017-10-15 DIAGNOSIS — R69 Illness, unspecified: Secondary | ICD-10-CM | POA: Diagnosis not present

## 2017-10-15 DIAGNOSIS — R262 Difficulty in walking, not elsewhere classified: Secondary | ICD-10-CM | POA: Diagnosis not present

## 2017-10-15 DIAGNOSIS — M6281 Muscle weakness (generalized): Secondary | ICD-10-CM | POA: Diagnosis not present

## 2017-10-16 DIAGNOSIS — R262 Difficulty in walking, not elsewhere classified: Secondary | ICD-10-CM | POA: Diagnosis not present

## 2017-10-16 DIAGNOSIS — M6281 Muscle weakness (generalized): Secondary | ICD-10-CM | POA: Diagnosis not present

## 2017-10-16 DIAGNOSIS — R41841 Cognitive communication deficit: Secondary | ICD-10-CM | POA: Diagnosis not present

## 2017-10-16 DIAGNOSIS — R278 Other lack of coordination: Secondary | ICD-10-CM | POA: Diagnosis not present

## 2017-10-16 DIAGNOSIS — I509 Heart failure, unspecified: Secondary | ICD-10-CM | POA: Diagnosis not present

## 2017-10-16 DIAGNOSIS — R69 Illness, unspecified: Secondary | ICD-10-CM | POA: Diagnosis not present

## 2017-10-17 DIAGNOSIS — I509 Heart failure, unspecified: Secondary | ICD-10-CM | POA: Diagnosis not present

## 2017-10-17 DIAGNOSIS — R278 Other lack of coordination: Secondary | ICD-10-CM | POA: Diagnosis not present

## 2017-10-17 DIAGNOSIS — M6281 Muscle weakness (generalized): Secondary | ICD-10-CM | POA: Diagnosis not present

## 2017-10-17 DIAGNOSIS — R69 Illness, unspecified: Secondary | ICD-10-CM | POA: Diagnosis not present

## 2017-10-17 DIAGNOSIS — R262 Difficulty in walking, not elsewhere classified: Secondary | ICD-10-CM | POA: Diagnosis not present

## 2017-10-17 DIAGNOSIS — R41841 Cognitive communication deficit: Secondary | ICD-10-CM | POA: Diagnosis not present

## 2017-10-19 DIAGNOSIS — I509 Heart failure, unspecified: Secondary | ICD-10-CM | POA: Diagnosis not present

## 2017-10-19 DIAGNOSIS — R41841 Cognitive communication deficit: Secondary | ICD-10-CM | POA: Diagnosis not present

## 2017-10-19 DIAGNOSIS — R69 Illness, unspecified: Secondary | ICD-10-CM | POA: Diagnosis not present

## 2017-10-19 DIAGNOSIS — M6281 Muscle weakness (generalized): Secondary | ICD-10-CM | POA: Diagnosis not present

## 2017-10-19 DIAGNOSIS — R278 Other lack of coordination: Secondary | ICD-10-CM | POA: Diagnosis not present

## 2017-10-19 DIAGNOSIS — R262 Difficulty in walking, not elsewhere classified: Secondary | ICD-10-CM | POA: Diagnosis not present

## 2017-10-20 DIAGNOSIS — E8809 Other disorders of plasma-protein metabolism, not elsewhere classified: Secondary | ICD-10-CM | POA: Diagnosis not present

## 2017-10-20 DIAGNOSIS — R41841 Cognitive communication deficit: Secondary | ICD-10-CM | POA: Diagnosis not present

## 2017-10-20 DIAGNOSIS — R262 Difficulty in walking, not elsewhere classified: Secondary | ICD-10-CM | POA: Diagnosis not present

## 2017-10-20 DIAGNOSIS — R269 Unspecified abnormalities of gait and mobility: Secondary | ICD-10-CM | POA: Diagnosis not present

## 2017-10-20 DIAGNOSIS — M810 Age-related osteoporosis without current pathological fracture: Secondary | ICD-10-CM | POA: Diagnosis not present

## 2017-10-20 DIAGNOSIS — I509 Heart failure, unspecified: Secondary | ICD-10-CM | POA: Diagnosis not present

## 2017-10-20 DIAGNOSIS — I482 Chronic atrial fibrillation: Secondary | ICD-10-CM | POA: Diagnosis not present

## 2017-10-20 DIAGNOSIS — R609 Edema, unspecified: Secondary | ICD-10-CM | POA: Diagnosis not present

## 2017-10-20 DIAGNOSIS — M6281 Muscle weakness (generalized): Secondary | ICD-10-CM | POA: Diagnosis not present

## 2017-10-20 DIAGNOSIS — Z7901 Long term (current) use of anticoagulants: Secondary | ICD-10-CM | POA: Diagnosis not present

## 2017-10-20 DIAGNOSIS — R69 Illness, unspecified: Secondary | ICD-10-CM | POA: Diagnosis not present

## 2017-10-20 DIAGNOSIS — R278 Other lack of coordination: Secondary | ICD-10-CM | POA: Diagnosis not present

## 2017-10-22 DIAGNOSIS — R278 Other lack of coordination: Secondary | ICD-10-CM | POA: Diagnosis not present

## 2017-10-22 DIAGNOSIS — I509 Heart failure, unspecified: Secondary | ICD-10-CM | POA: Diagnosis not present

## 2017-10-22 DIAGNOSIS — M6281 Muscle weakness (generalized): Secondary | ICD-10-CM | POA: Diagnosis not present

## 2017-10-22 DIAGNOSIS — R41841 Cognitive communication deficit: Secondary | ICD-10-CM | POA: Diagnosis not present

## 2017-10-22 DIAGNOSIS — R262 Difficulty in walking, not elsewhere classified: Secondary | ICD-10-CM | POA: Diagnosis not present

## 2017-10-22 DIAGNOSIS — R69 Illness, unspecified: Secondary | ICD-10-CM | POA: Diagnosis not present

## 2017-10-23 DIAGNOSIS — M6281 Muscle weakness (generalized): Secondary | ICD-10-CM | POA: Diagnosis not present

## 2017-10-23 DIAGNOSIS — R69 Illness, unspecified: Secondary | ICD-10-CM | POA: Diagnosis not present

## 2017-10-23 DIAGNOSIS — R262 Difficulty in walking, not elsewhere classified: Secondary | ICD-10-CM | POA: Diagnosis not present

## 2017-10-23 DIAGNOSIS — R41841 Cognitive communication deficit: Secondary | ICD-10-CM | POA: Diagnosis not present

## 2017-10-23 DIAGNOSIS — R278 Other lack of coordination: Secondary | ICD-10-CM | POA: Diagnosis not present

## 2017-10-23 DIAGNOSIS — I509 Heart failure, unspecified: Secondary | ICD-10-CM | POA: Diagnosis not present

## 2017-10-28 DIAGNOSIS — R41841 Cognitive communication deficit: Secondary | ICD-10-CM | POA: Diagnosis not present

## 2017-10-28 DIAGNOSIS — R262 Difficulty in walking, not elsewhere classified: Secondary | ICD-10-CM | POA: Diagnosis not present

## 2017-10-28 DIAGNOSIS — R278 Other lack of coordination: Secondary | ICD-10-CM | POA: Diagnosis not present

## 2017-10-28 DIAGNOSIS — J9601 Acute respiratory failure with hypoxia: Secondary | ICD-10-CM | POA: Diagnosis not present

## 2017-10-28 DIAGNOSIS — L8990 Pressure ulcer of unspecified site, unspecified stage: Secondary | ICD-10-CM | POA: Diagnosis not present

## 2017-10-28 DIAGNOSIS — M6281 Muscle weakness (generalized): Secondary | ICD-10-CM | POA: Diagnosis not present

## 2017-10-28 DIAGNOSIS — I509 Heart failure, unspecified: Secondary | ICD-10-CM | POA: Diagnosis not present

## 2017-10-28 DIAGNOSIS — R69 Illness, unspecified: Secondary | ICD-10-CM | POA: Diagnosis not present

## 2017-10-28 DIAGNOSIS — I5023 Acute on chronic systolic (congestive) heart failure: Secondary | ICD-10-CM | POA: Diagnosis not present

## 2017-10-29 DIAGNOSIS — R262 Difficulty in walking, not elsewhere classified: Secondary | ICD-10-CM | POA: Diagnosis not present

## 2017-10-29 DIAGNOSIS — R278 Other lack of coordination: Secondary | ICD-10-CM | POA: Diagnosis not present

## 2017-10-29 DIAGNOSIS — R41841 Cognitive communication deficit: Secondary | ICD-10-CM | POA: Diagnosis not present

## 2017-10-29 DIAGNOSIS — M6281 Muscle weakness (generalized): Secondary | ICD-10-CM | POA: Diagnosis not present

## 2017-10-29 DIAGNOSIS — R69 Illness, unspecified: Secondary | ICD-10-CM | POA: Diagnosis not present

## 2017-10-29 DIAGNOSIS — I509 Heart failure, unspecified: Secondary | ICD-10-CM | POA: Diagnosis not present

## 2017-10-29 DIAGNOSIS — I4891 Unspecified atrial fibrillation: Secondary | ICD-10-CM | POA: Diagnosis not present

## 2017-10-29 DIAGNOSIS — I482 Chronic atrial fibrillation: Secondary | ICD-10-CM | POA: Diagnosis not present

## 2017-10-29 DIAGNOSIS — Z7901 Long term (current) use of anticoagulants: Secondary | ICD-10-CM | POA: Diagnosis not present

## 2017-10-30 DIAGNOSIS — R278 Other lack of coordination: Secondary | ICD-10-CM | POA: Diagnosis not present

## 2017-10-30 DIAGNOSIS — M6281 Muscle weakness (generalized): Secondary | ICD-10-CM | POA: Diagnosis not present

## 2017-10-30 DIAGNOSIS — I509 Heart failure, unspecified: Secondary | ICD-10-CM | POA: Diagnosis not present

## 2017-10-30 DIAGNOSIS — R41841 Cognitive communication deficit: Secondary | ICD-10-CM | POA: Diagnosis not present

## 2017-10-30 DIAGNOSIS — R69 Illness, unspecified: Secondary | ICD-10-CM | POA: Diagnosis not present

## 2017-10-30 DIAGNOSIS — R262 Difficulty in walking, not elsewhere classified: Secondary | ICD-10-CM | POA: Diagnosis not present

## 2017-11-04 DIAGNOSIS — R262 Difficulty in walking, not elsewhere classified: Secondary | ICD-10-CM | POA: Diagnosis not present

## 2017-11-04 DIAGNOSIS — R41841 Cognitive communication deficit: Secondary | ICD-10-CM | POA: Diagnosis not present

## 2017-11-04 DIAGNOSIS — R69 Illness, unspecified: Secondary | ICD-10-CM | POA: Diagnosis not present

## 2017-11-04 DIAGNOSIS — R278 Other lack of coordination: Secondary | ICD-10-CM | POA: Diagnosis not present

## 2017-11-04 DIAGNOSIS — I509 Heart failure, unspecified: Secondary | ICD-10-CM | POA: Diagnosis not present

## 2017-11-04 DIAGNOSIS — M6281 Muscle weakness (generalized): Secondary | ICD-10-CM | POA: Diagnosis not present

## 2017-11-06 DIAGNOSIS — R278 Other lack of coordination: Secondary | ICD-10-CM | POA: Diagnosis not present

## 2017-11-06 DIAGNOSIS — R262 Difficulty in walking, not elsewhere classified: Secondary | ICD-10-CM | POA: Diagnosis not present

## 2017-11-06 DIAGNOSIS — R41841 Cognitive communication deficit: Secondary | ICD-10-CM | POA: Diagnosis not present

## 2017-11-06 DIAGNOSIS — M6281 Muscle weakness (generalized): Secondary | ICD-10-CM | POA: Diagnosis not present

## 2017-11-06 DIAGNOSIS — I509 Heart failure, unspecified: Secondary | ICD-10-CM | POA: Diagnosis not present

## 2017-11-06 DIAGNOSIS — R69 Illness, unspecified: Secondary | ICD-10-CM | POA: Diagnosis not present

## 2017-11-07 DIAGNOSIS — R69 Illness, unspecified: Secondary | ICD-10-CM | POA: Diagnosis not present

## 2017-11-07 DIAGNOSIS — I509 Heart failure, unspecified: Secondary | ICD-10-CM | POA: Diagnosis not present

## 2017-11-07 DIAGNOSIS — R41841 Cognitive communication deficit: Secondary | ICD-10-CM | POA: Diagnosis not present

## 2017-11-07 DIAGNOSIS — R262 Difficulty in walking, not elsewhere classified: Secondary | ICD-10-CM | POA: Diagnosis not present

## 2017-11-07 DIAGNOSIS — R278 Other lack of coordination: Secondary | ICD-10-CM | POA: Diagnosis not present

## 2017-11-07 DIAGNOSIS — M6281 Muscle weakness (generalized): Secondary | ICD-10-CM | POA: Diagnosis not present

## 2017-11-09 ENCOUNTER — Encounter: Payer: Self-pay | Admitting: Internal Medicine

## 2017-11-09 NOTE — Progress Notes (Signed)
Patient ID: Wendy Flowers, female   DOB: Aug 31, 1926, 84 y.o.   MRN: [id number]  Provider:  DR Arletha Grippe Location:  [place] of Service:  SNF (31)  PCP: Deland Pretty, MD Patient Care Team: Deland Pretty, MD as PCP - General (Internal Medicine) Croitoru, Dani Gobble, MD as PCP - Cardiology (Cardiology)  Extended Emergency Contact Information Primary Emergency Contact: Burton,Charles Address: [place], [place] Phone: [phone] Work Phone: [phone] Relation: Nephew  Code Status:  Goals of Care: Advanced Directive information Advanced Directives 09/04/2017  Does Patient Have a Medical Advance Directive? Yes  Type of Advance Directive Living will;Healthcare Power of Attorney  Does patient want to make changes to medical advance directive? No - Patient declined  Copy of [place] in Chart? No - copy requested  Would patient like information on creating a medical advance directive? -      Chief Complaint  Patient presents with  . New Admit To SNF    HPI: Patient is a 84 y.o. female seen today for admission to SNF for afib w RVR,  Acute respiratory failure with hypoxia, pressure ulcer, acute/chronic HF, AKI, severe protein calorie malnutrition, and dementia. Cardio consulted. Imaging revealed thoracic compression fx acute vs subacute. IR consulted but pt not candidate for vertebral augmentation. BNP 258.3; TSH 2.545; respiratory panel neg; Cr 1.14-->0.64; LDL 64 at d/c. Albumin has been as low as 3.0. She presents to SNF for short term rehab.  Today she reports fatigue and weakness. She is a poor historian due to dementia. Hx obtained from chart.   Thyroid - stable on levothyroxine  afib - rate controlled on BB and dig. She takes coumadin for anticoagulation. INR 1.65 at d/c. GOAL INR 2-3. CHADsVASc 5. EF 45-50%. Followed by cardio  HTN - BP stable on metoprolol  Hyperlipidemia  - stable on statin. LDL 64  Dementia - currently not on med for cognition  Protein calorie malnutrition - she gets nutritional supplements per facility protocol  Past Medical History:  Diagnosis Date  . Accident due to mechanical fall without injury   . Chronic systolic heart failure ([place])   . Dementia   . HLD (hyperlipidemia)   . HTN (hypertension)   . Hypertensive left ventricular hypertrophy with heart failure ([place])   . Hypothyroidism   . Lower extremity edema   . Osteoporosis   . Permanent atrial fibrillation ([place])   . Pulmonary hypertension ([place])    dx'd by right heart cath/Croituro  . Thoracic compression fracture [facility])    Past Surgical History:  Procedure Laterality Date  . BREAST LUMPECTOMY  4235   left  . CARDIAC CATHETERIZATION  06/13/2010   R & LHC: no CAD  . CATARACT EXTRACTION  2008   bilateral  . [place]   left  . US ECHOCARDIOGRAPHY  02/04/2012   LA mod. dilated,RA mild-mod dilated,mild MR,mod. TR    reports that she has never smoked. She has never used smokeless tobacco. She reports that she drinks alcohol. She reports that she does not use drugs. Social History   Socioeconomic History  . Marital status: Widowed    Spouse name: Not on file  . Number of children: Not on file  . Years of education: Not on file  . Highest education level: Not on file  Occupational History  . Not on file  Social Needs  .  Financial resource strain: Not on file  . Food insecurity:    Worry: Not on file    Inability: Not on file  . Transportation needs:    Medical: Not on file    Non-medical: Not on file  Tobacco Use  . Smoking status: Never Smoker  . Smokeless tobacco: Never Used  Substance and Sexual Activity  . Alcohol use: Yes    Comment: seldom; 03/17/2013-occasionally drinks a glass of wine before bed  . Drug use: No  . Sexual activity: Not on file  Lifestyle  . Physical activity:    Days per week: Not on file    Minutes per session: Not on file    . Stress: Not on file  Relationships  . Social connections:    Talks on phone: Not on file    Gets together: Not on file    Attends religious service: Not on file    Active member of club or organization: Not on file    Attends meetings of clubs or organizations: Not on file    Relationship status: Not on file  . Intimate partner violence:    Fear of current or ex partner: Not on file    Emotionally abused: Not on file    Physically abused: Not on file    Forced sexual activity: Not on file  Other Topics Concern  . Not on file  Social History Narrative  . Not on file    Functional Status Survey:    Family History  Problem Relation Age of Onset  . Heart attack Mother   . Heart attack Father   . Diabetes Brother   . Cancer Brother     Health Maintenance  Topic Date Due  . DEXA SCAN  05/19/2018 (Originally 01/16/1994)  . TETANUS/TDAP  05/19/2018 (Originally 01/17/1946)  . PNA vac Low Risk Adult (1 of 2 - PCV13) 05/19/2018 (Originally 01/16/1994)  . INFLUENZA VACCINE  Completed    Allergies  Allergen Reactions  . Foradil [Formoterol] Other (See Comments)    Reaction not recalled (??)    Outpatient Encounter Medications as of 09/11/2017  Medication Sig  . acetaminophen (TYLENOL) 325 MG tablet Take 650 mg by mouth every 6 (six) hours as needed for moderate pain.   . Calcium Carb-Cholecalciferol (CALCIUM + D3) 600-200 MG-UNIT TABS Take 1 tablet by mouth daily.   . collagenase (SANTYL) ointment Apply topically daily. Apply Santyl to sacrum and left buttock wound Q day, then cover with moist gauze and foam dressing.  (Change foam dressing Q 3 days or PRN soiling.)  . digoxin (LANOXIN) 0.125 MG tablet Take 1 tablet (0.125 mg total) by mouth daily.  . feeding supplement, ENSURE ENLIVE, (ENSURE ENLIVE) LIQD Take 237 mLs by mouth 3 (three) times daily between meals.  Mar[place] Kitchen levothyroxine (SYNTHROID, LEVOTHROID) 137 MCG tablet Take 137 mcg by mouth daily before breakfast.  . metoprolol  succinate (TOPROL-XL) 100 MG 24 hr tablet Take 1 tablet (100 mg total) by mouth daily. Take with or immediately following a meal.  . Multiple Vitamin (MULTIVITAMIN WITH MINERALS) TABS tablet Take 1 tablet by mouth daily.  . OXYGEN Inhale 2 L into the lungs continuous.  Derrill Memo ON 11/14/2017] PROLIA 60 MG/ML SOLN injection Inject 60 mg into the skin every 6 (six) months.  . warfarin (COUMADIN) 2 MG tablet 2mg  by mouth once daily  . [DISCONTINUED] atorvastatin (LIPITOR) 20 MG tablet Take 20 mg by mouth daily.  . [DISCONTINUED] escitalopram (LEXAPRO) 10 MG tablet 10mg  by  mouth once daily  . [DISCONTINUED] simvastatin (ZOCOR) 40 MG tablet 40mg  by mouth once daily   No facility-administered encounter medications on file as of 09/11/2017.     Review of Systems  Unable to perform ROS: Dementia    Vitals:   09/11/17 1423  BP: 122/65  Pulse: 80  Temp: (!) 97.4 F (36.3 C)  SpO2: 94%  Weight: 119 lb (54 kg)   Body mass index is 23.24 kg/m. Physical Exam  Constitutional: She appears well-developed.  Frail appearing in NAD, lying in bed; Volin O2 intact @ 2L/min  HENT:  Mouth/Throat: Oropharynx is clear and moist. No oropharyngeal exudate.  MM dry  Eyes: Pupils are equal, round, and reactive to light. No scleral icterus.  Neck: Neck supple. Carotid bruit is not present. No tracheal deviation present. No thyromegaly present.  Cardiovascular: Normal rate and intact distal pulses. An irregularly irregular rhythm present. Exam reveals no gallop and no friction rub.  Murmur heard.  Systolic murmur is present with a grade of 1/6. No LE edema b/l. no calf TTP.   Pulmonary/Chest: Effort normal. No stridor. No respiratory distress. She has no wheezes. She has rales (at base b/l).  Abdominal: Soft. Normal appearance and bowel sounds are normal. She exhibits no distension and no mass. There is no hepatomegaly. There is no tenderness. There is no rigidity, no rebound and no guarding. No hernia.    Musculoskeletal: She exhibits edema.  Lymphadenopathy:    She has no cervical adenopathy.  Neurological: She is alert.  Skin: Skin is warm and dry. No rash noted.  Sacral wound per facility wound care provider  Psychiatric: She has a normal mood and affect. Her behavior is normal. Thought content normal.    Labs reviewed: Basic Metabolic Panel: Recent Labs    05/15/17 0636  09/07/17 0722 09/08/17 0725 09/09/17 0448  NA 137   < > 135 134* 134*  K 3.9   < > 5.5* 4.7 4.8  CL 100*   < > 97* 96* 95*  CO2 30   < > 30 28 32  GLUCOSE 104*   < > 113* 108* 104*  BUN 22*   < > 18 16 16   CREATININE 0.95   < > 0.72 0.56 0.64  CALCIUM 9.8   < > 9.7 9.3 9.3  MG 1.8  --   --   --   --    < > = values in this interval not displayed.   Liver Function Tests: Recent Labs    05/16/17 0444 08/11/17 2059 09/06/17 0449  AST 20 28 22   ALT 11* 22 17  ALKPHOS 62 72 97  BILITOT 0.8 1.1 1.0  PROT 5.7* 6.4* 5.3*  ALBUMIN 3.0* 3.6 2.8*   No results for input(s): LIPASE, AMYLASE in the last 8760 hours. No results for input(s): AMMONIA in the last 8760 hours. CBC: Recent Labs    05/13/17 1652  08/11/17 2059  09/05/17 0518 09/06/17 0449 09/07/17 0722  WBC 5.7   < > 10.4   < > 7.1 8.5 7.8  NEUTROABS 4.3  --  9.5*  --   --   --   --   HGB 13.8   < > 11.6*   < > 12.2 12.7 13.0  HCT 41.9   < > 35.4*   < > 38.8 40.3 41.8  MCV 88.8   < > 87.4   < > 89.0 88.0 88.9  PLT 277   < > 271   < >  201 202 196   < > = values in this interval not displayed.   Cardiac Enzymes: Recent Labs    09/04/17 0102  TROPONINI <0.03   BNP: Invalid input(s): POCBNP No results found for: HGBA1C Lab Results  Component Value Date   TSH 2.545 09/04/2017   No results found for: VITAMINB12 No results found for: FOLATE No results found for: IRON, TIBC, FERRITIN  Imaging and Procedures obtained prior to SNF admission: Dg Chest 2 View  Result Date: 09/04/2017 CLINICAL DATA:  Atrial fibrillation and shortness  of breath EXAM: CHEST  2 VIEW COMPARISON:  08/11/2017 CT and chest x-ray, chest x-ray 02/05/2014 FINDINGS: Cardiomegaly with small left greater than right pleural effusions. Left basilar atelectasis or pneumonia. Aortic atherosclerosis. Multiple compression deformities of the spine. IMPRESSION: Cardiomegaly with small left greater than right pleural effusions and left basilar atelectasis or pneumonia. Electronically Signed   By: Donavan Foil M.D.   On: 09/04/2017 01:30   Ct Head Wo Contrast  Result Date: 09/04/2017 CLINICAL DATA:  Initial evaluation for acute trauma, fall. EXAM: CT HEAD WITHOUT CONTRAST CT CERVICAL SPINE WITHOUT CONTRAST TECHNIQUE: Multidetector CT imaging of the head and cervical spine was performed following the standard protocol without intravenous contrast. Multiplanar CT image reconstructions of the cervical spine were also generated. COMPARISON:  Prior CT from 08/11/2017. FINDINGS: CT HEAD FINDINGS Brain: Age-related cerebral volume loss with moderate chronic small vessel ischemic disease no acute intracranial hemorrhage. No acute large vessel territory infarct. No mass lesion, midline shift or mass effect. No hydrocephalus. No extra-axial fluid collection. Vascular: No hyperdense vessel. Scattered vascular calcifications noted within the carotid siphons. Skull: Scalp soft tissues and calvarium within normal limits. Sinuses/Orbits: Globes and orbital soft tissues within normal limits. Paranasal sinuses and mastoid air cells are clear. Other: None. CT CERVICAL SPINE FINDINGS Alignment: Exaggeration of the normal cervical lordosis. Trace anterolisthesis of T2 on T3, stable. Skull base and vertebrae: Skull base intact. Normal C1-2 articulations are preserved in the dens is intact. There is progressive height loss with sclerosis at the superior endplate of T3 as compared to previous, consistent with acute to subacute compression fracture. Height loss of approximate 75% with 3 mm bony  retropulsion. The vertebral body heights otherwise maintained. No other acute fracture. Soft tissues and spinal canal: Soft tissues of the neck demonstrate no acute abnormality. No prevertebral edema. Spinal canal within normal limits. Disc levels: Mild degenerative disc bulging noted at C3-4 and C4-5 without significant stenosis. Left-sided facet arthropathy noted at C4-5 and C5-6. Upper chest: Visualized upper chest within normal limits. Partially visualized lung apices are clear. Other: None. IMPRESSION: CT BRAIN: 1. No acute intracranial process. 2. Moderate generalized age-related cerebral atrophy with moderate chronic small vessel ischemic disease. CT CERVICAL SPINE: 1. No acute cervical spine injury identified. 2. Acute to subacute compression fracture involving the T3 vertebral body with up to 70% height loss and 3 mm bony retropulsion. There was a mild compression deformity at this level on prior study from 08/11/2017, suggesting that this may be subacute in nature. Correlation with physical exam recommended. Electronically Signed   By: Jeannine Boga M.D.   On: 09/04/2017 04:55   Ct Angio Chest Pe W And/or Wo Contrast  Result Date: 09/04/2017 CLINICAL DATA:  Back pain and shortness of breath. Fall, striking head on the floor. No loss of consciousness. History of dementia. Low oxygen saturation. EXAM: CT ANGIOGRAPHY CHEST WITH CONTRAST TECHNIQUE: Multidetector CT imaging of the chest was performed using the standard  protocol during bolus administration of intravenous contrast. Multiplanar CT image reconstructions and MIPs were obtained to evaluate the vascular anatomy. CONTRAST:  19mL ISOVUE-370 IOPAMIDOL (ISOVUE-370) INJECTION 76% COMPARISON:  None. FINDINGS: Cardiovascular: Good opacification of the central and segmental pulmonary arteries. No focal filling defects. No evidence of significant pulmonary embolus. Diffuse cardiac enlargement with particular dilatation of the right heart and left  atrium. No pericardial effusion. Normal caliber thoracic aorta with scattered calcifications. Coronary artery calcifications. Mediastinum/Nodes: Esophagus is decompressed. No significant lymphadenopathy in the chest. Lungs/Pleura: Small bilateral pleural effusions with basilar atelectasis. Emphysematous changes in the lungs. Patchy areas of airspace disease bilaterally likely representing edema. Multifocal pneumonia is another possibility. No pneumothorax. Airways are patent. Upper Abdomen: Multiple low-attenuation lesions in the liver likely representing cysts. Musculoskeletal: Degenerative changes throughout the thoracic spine. Multiple thoracic vertebral compression deformities with ballooning of interspaces, likely indicating osteoporosis. Similar findings were demonstrated on previous CT thoracic spine from 08/11/2017. There is progression of compression at T3 since the previous study. Review of the MIP images confirms the above findings. IMPRESSION: 1. No evidence of significant pulmonary embolus. 2. Diffuse cardiac enlargement. 3. Bilateral pleural effusions and airspace infiltrates likely representing edema due to congestive failure. Pneumonia could also have this appearance. 4. Multiple thoracic compression fractures likely indicating osteoporosis. Progression of compression at T3 since the previous study. 5. Multiple hepatic cysts. Aortic Atherosclerosis (ICD10-I70.0). Electronically Signed   By: Lucienne Capers M.D.   On: 09/04/2017 04:37   Ct Cervical Spine Wo Contrast  Result Date: 09/04/2017 CLINICAL DATA:  Initial evaluation for acute trauma, fall. EXAM: CT HEAD WITHOUT CONTRAST CT CERVICAL SPINE WITHOUT CONTRAST TECHNIQUE: Multidetector CT imaging of the head and cervical spine was performed following the standard protocol without intravenous contrast. Multiplanar CT image reconstructions of the cervical spine were also generated. COMPARISON:  Prior CT from 08/11/2017. FINDINGS: CT HEAD FINDINGS  Brain: Age-related cerebral volume loss with moderate chronic small vessel ischemic disease no acute intracranial hemorrhage. No acute large vessel territory infarct. No mass lesion, midline shift or mass effect. No hydrocephalus. No extra-axial fluid collection. Vascular: No hyperdense vessel. Scattered vascular calcifications noted within the carotid siphons. Skull: Scalp soft tissues and calvarium within normal limits. Sinuses/Orbits: Globes and orbital soft tissues within normal limits. Paranasal sinuses and mastoid air cells are clear. Other: None. CT CERVICAL SPINE FINDINGS Alignment: Exaggeration of the normal cervical lordosis. Trace anterolisthesis of T2 on T3, stable. Skull base and vertebrae: Skull base intact. Normal C1-2 articulations are preserved in the dens is intact. There is progressive height loss with sclerosis at the superior endplate of T3 as compared to previous, consistent with acute to subacute compression fracture. Height loss of approximate 75% with 3 mm bony retropulsion. The vertebral body heights otherwise maintained. No other acute fracture. Soft tissues and spinal canal: Soft tissues of the neck demonstrate no acute abnormality. No prevertebral edema. Spinal canal within normal limits. Disc levels: Mild degenerative disc bulging noted at C3-4 and C4-5 without significant stenosis. Left-sided facet arthropathy noted at C4-5 and C5-6. Upper chest: Visualized upper chest within normal limits. Partially visualized lung apices are clear. Other: None. IMPRESSION: CT BRAIN: 1. No acute intracranial process. 2. Moderate generalized age-related cerebral atrophy with moderate chronic small vessel ischemic disease. CT CERVICAL SPINE: 1. No acute cervical spine injury identified. 2. Acute to subacute compression fracture involving the T3 vertebral body with up to 70% height loss and 3 mm bony retropulsion. There was a mild compression deformity at this level  on prior study from 08/11/2017,  suggesting that this may be subacute in nature. Correlation with physical exam recommended. Electronically Signed   By: Jeannine Boga M.D.   On: 09/04/2017 04:55    Assessment/Plan   ICD-10-CM   1. Chronic systolic CHF (congestive heart failure) (HCC) I50.22   2. Chronic atrial fibrillation (HCC) I48.2   3. Essential hypertension I10   4. Hypothyroidism due to acquired atrophy of thyroid E03.4   5. Mixed hyperlipidemia E78.2   6. Pressure injury of skin of sacral region, unspecified injury stage L89.159      Cont current meds as ordered  PT/OT/ST as ordered  Follow INR with goal 2-3  F/u with specialists as scheduled  Daily weight and record due to HF  Wound care as ordered  GOAL: short term rehab and d/c home when medically appropriate. Communicated with pt and nursing.  Will follow  Labs/tests ordered: none    [place] S. Perlie Gold  [facility] [address] [place], [place] [address] [phone] Cell (Monday-Friday 8 AM - 5 PM) [phone] After 5 PM and follow prompts

## 2017-11-10 DIAGNOSIS — R69 Illness, unspecified: Secondary | ICD-10-CM | POA: Diagnosis not present

## 2017-11-10 DIAGNOSIS — I509 Heart failure, unspecified: Secondary | ICD-10-CM | POA: Diagnosis not present

## 2017-11-10 DIAGNOSIS — R609 Edema, unspecified: Secondary | ICD-10-CM | POA: Diagnosis not present

## 2017-11-10 DIAGNOSIS — Z7901 Long term (current) use of anticoagulants: Secondary | ICD-10-CM | POA: Diagnosis not present

## 2017-11-10 DIAGNOSIS — Z9981 Dependence on supplemental oxygen: Secondary | ICD-10-CM | POA: Diagnosis not present

## 2017-11-11 DIAGNOSIS — R69 Illness, unspecified: Secondary | ICD-10-CM | POA: Diagnosis not present

## 2017-11-11 DIAGNOSIS — R262 Difficulty in walking, not elsewhere classified: Secondary | ICD-10-CM | POA: Diagnosis not present

## 2017-11-11 DIAGNOSIS — I509 Heart failure, unspecified: Secondary | ICD-10-CM | POA: Diagnosis not present

## 2017-11-11 DIAGNOSIS — M6281 Muscle weakness (generalized): Secondary | ICD-10-CM | POA: Diagnosis not present

## 2017-11-11 DIAGNOSIS — R278 Other lack of coordination: Secondary | ICD-10-CM | POA: Diagnosis not present

## 2017-11-13 DIAGNOSIS — I509 Heart failure, unspecified: Secondary | ICD-10-CM | POA: Diagnosis not present

## 2017-11-13 DIAGNOSIS — R262 Difficulty in walking, not elsewhere classified: Secondary | ICD-10-CM | POA: Diagnosis not present

## 2017-11-13 DIAGNOSIS — R69 Illness, unspecified: Secondary | ICD-10-CM | POA: Diagnosis not present

## 2017-11-13 DIAGNOSIS — R278 Other lack of coordination: Secondary | ICD-10-CM | POA: Diagnosis not present

## 2017-11-13 DIAGNOSIS — M6281 Muscle weakness (generalized): Secondary | ICD-10-CM | POA: Diagnosis not present

## 2017-11-14 DIAGNOSIS — M6281 Muscle weakness (generalized): Secondary | ICD-10-CM | POA: Diagnosis not present

## 2017-11-14 DIAGNOSIS — R69 Illness, unspecified: Secondary | ICD-10-CM | POA: Diagnosis not present

## 2017-11-14 DIAGNOSIS — R262 Difficulty in walking, not elsewhere classified: Secondary | ICD-10-CM | POA: Diagnosis not present

## 2017-11-14 DIAGNOSIS — I509 Heart failure, unspecified: Secondary | ICD-10-CM | POA: Diagnosis not present

## 2017-11-14 DIAGNOSIS — R278 Other lack of coordination: Secondary | ICD-10-CM | POA: Diagnosis not present

## 2017-11-17 DIAGNOSIS — J040 Acute laryngitis: Secondary | ICD-10-CM | POA: Diagnosis not present

## 2017-11-17 DIAGNOSIS — E8770 Fluid overload, unspecified: Secondary | ICD-10-CM | POA: Diagnosis not present

## 2017-11-17 DIAGNOSIS — R69 Illness, unspecified: Secondary | ICD-10-CM | POA: Diagnosis not present

## 2017-11-17 DIAGNOSIS — Z9981 Dependence on supplemental oxygen: Secondary | ICD-10-CM | POA: Diagnosis not present

## 2017-11-17 DIAGNOSIS — I481 Persistent atrial fibrillation: Secondary | ICD-10-CM | POA: Diagnosis not present

## 2017-11-17 DIAGNOSIS — I5043 Acute on chronic combined systolic (congestive) and diastolic (congestive) heart failure: Secondary | ICD-10-CM | POA: Diagnosis not present

## 2017-11-18 DIAGNOSIS — R262 Difficulty in walking, not elsewhere classified: Secondary | ICD-10-CM | POA: Diagnosis not present

## 2017-11-18 DIAGNOSIS — R05 Cough: Secondary | ICD-10-CM | POA: Diagnosis not present

## 2017-11-18 DIAGNOSIS — R69 Illness, unspecified: Secondary | ICD-10-CM | POA: Diagnosis not present

## 2017-11-18 DIAGNOSIS — R278 Other lack of coordination: Secondary | ICD-10-CM | POA: Diagnosis not present

## 2017-11-18 DIAGNOSIS — M6281 Muscle weakness (generalized): Secondary | ICD-10-CM | POA: Diagnosis not present

## 2017-11-18 DIAGNOSIS — I509 Heart failure, unspecified: Secondary | ICD-10-CM | POA: Diagnosis not present

## 2017-11-19 DIAGNOSIS — I482 Chronic atrial fibrillation: Secondary | ICD-10-CM | POA: Diagnosis not present

## 2017-11-19 DIAGNOSIS — Z9981 Dependence on supplemental oxygen: Secondary | ICD-10-CM | POA: Diagnosis not present

## 2017-11-19 DIAGNOSIS — R69 Illness, unspecified: Secondary | ICD-10-CM | POA: Diagnosis not present

## 2017-11-19 DIAGNOSIS — R609 Edema, unspecified: Secondary | ICD-10-CM | POA: Diagnosis not present

## 2017-11-19 DIAGNOSIS — I509 Heart failure, unspecified: Secondary | ICD-10-CM | POA: Diagnosis not present

## 2017-11-19 DIAGNOSIS — Z7901 Long term (current) use of anticoagulants: Secondary | ICD-10-CM | POA: Diagnosis not present

## 2017-11-20 ENCOUNTER — Other Ambulatory Visit: Payer: Self-pay

## 2017-11-20 ENCOUNTER — Encounter (HOSPITAL_COMMUNITY): Payer: Self-pay | Admitting: *Deleted

## 2017-11-20 ENCOUNTER — Inpatient Hospital Stay (HOSPITAL_COMMUNITY)
Admission: EM | Admit: 2017-11-20 | Discharge: 2017-12-10 | DRG: 291 | Disposition: E | Payer: Medicare HMO | Attending: Family Medicine | Admitting: Family Medicine

## 2017-11-20 ENCOUNTER — Emergency Department (HOSPITAL_COMMUNITY): Payer: Medicare HMO

## 2017-11-20 DIAGNOSIS — Z79890 Hormone replacement therapy (postmenopausal): Secondary | ICD-10-CM

## 2017-11-20 DIAGNOSIS — I2721 Secondary pulmonary arterial hypertension: Secondary | ICD-10-CM | POA: Diagnosis present

## 2017-11-20 DIAGNOSIS — Z7189 Other specified counseling: Secondary | ICD-10-CM

## 2017-11-20 DIAGNOSIS — R296 Repeated falls: Secondary | ICD-10-CM | POA: Diagnosis present

## 2017-11-20 DIAGNOSIS — I272 Pulmonary hypertension, unspecified: Secondary | ICD-10-CM

## 2017-11-20 DIAGNOSIS — I482 Chronic atrial fibrillation: Secondary | ICD-10-CM | POA: Diagnosis present

## 2017-11-20 DIAGNOSIS — Z66 Do not resuscitate: Secondary | ICD-10-CM | POA: Diagnosis present

## 2017-11-20 DIAGNOSIS — Z86011 Personal history of benign neoplasm of the brain: Secondary | ICD-10-CM | POA: Diagnosis not present

## 2017-11-20 DIAGNOSIS — I5021 Acute systolic (congestive) heart failure: Secondary | ICD-10-CM | POA: Diagnosis not present

## 2017-11-20 DIAGNOSIS — I428 Other cardiomyopathies: Secondary | ICD-10-CM | POA: Diagnosis not present

## 2017-11-20 DIAGNOSIS — E039 Hypothyroidism, unspecified: Secondary | ICD-10-CM | POA: Diagnosis present

## 2017-11-20 DIAGNOSIS — J9601 Acute respiratory failure with hypoxia: Secondary | ICD-10-CM | POA: Diagnosis not present

## 2017-11-20 DIAGNOSIS — Z7901 Long term (current) use of anticoagulants: Secondary | ICD-10-CM

## 2017-11-20 DIAGNOSIS — F0390 Unspecified dementia without behavioral disturbance: Secondary | ICD-10-CM | POA: Diagnosis present

## 2017-11-20 DIAGNOSIS — M810 Age-related osteoporosis without current pathological fracture: Secondary | ICD-10-CM | POA: Diagnosis present

## 2017-11-20 DIAGNOSIS — I5043 Acute on chronic combined systolic (congestive) and diastolic (congestive) heart failure: Secondary | ICD-10-CM

## 2017-11-20 DIAGNOSIS — R402432 Glasgow coma scale score 3-8, at arrival to emergency department: Secondary | ICD-10-CM | POA: Diagnosis present

## 2017-11-20 DIAGNOSIS — I5023 Acute on chronic systolic (congestive) heart failure: Secondary | ICD-10-CM | POA: Diagnosis present

## 2017-11-20 DIAGNOSIS — E785 Hyperlipidemia, unspecified: Secondary | ICD-10-CM | POA: Diagnosis present

## 2017-11-20 DIAGNOSIS — Z888 Allergy status to other drugs, medicaments and biological substances status: Secondary | ICD-10-CM

## 2017-11-20 DIAGNOSIS — J984 Other disorders of lung: Secondary | ICD-10-CM | POA: Diagnosis present

## 2017-11-20 DIAGNOSIS — R000 Tachycardia, unspecified: Secondary | ICD-10-CM | POA: Diagnosis present

## 2017-11-20 DIAGNOSIS — Z9841 Cataract extraction status, right eye: Secondary | ICD-10-CM

## 2017-11-20 DIAGNOSIS — R627 Adult failure to thrive: Secondary | ICD-10-CM | POA: Diagnosis present

## 2017-11-20 DIAGNOSIS — F329 Major depressive disorder, single episode, unspecified: Secondary | ICD-10-CM | POA: Diagnosis present

## 2017-11-20 DIAGNOSIS — Z79899 Other long term (current) drug therapy: Secondary | ICD-10-CM | POA: Diagnosis not present

## 2017-11-20 DIAGNOSIS — I4891 Unspecified atrial fibrillation: Secondary | ICD-10-CM | POA: Diagnosis not present

## 2017-11-20 DIAGNOSIS — I509 Heart failure, unspecified: Secondary | ICD-10-CM | POA: Diagnosis not present

## 2017-11-20 DIAGNOSIS — R0602 Shortness of breath: Secondary | ICD-10-CM

## 2017-11-20 DIAGNOSIS — I110 Hypertensive heart disease with heart failure: Secondary | ICD-10-CM | POA: Diagnosis not present

## 2017-11-20 DIAGNOSIS — I481 Persistent atrial fibrillation: Secondary | ICD-10-CM | POA: Diagnosis present

## 2017-11-20 DIAGNOSIS — J810 Acute pulmonary edema: Secondary | ICD-10-CM

## 2017-11-20 DIAGNOSIS — R69 Illness, unspecified: Secondary | ICD-10-CM | POA: Diagnosis not present

## 2017-11-20 DIAGNOSIS — Z8249 Family history of ischemic heart disease and other diseases of the circulatory system: Secondary | ICD-10-CM

## 2017-11-20 DIAGNOSIS — Z515 Encounter for palliative care: Secondary | ICD-10-CM | POA: Diagnosis not present

## 2017-11-20 DIAGNOSIS — Z9842 Cataract extraction status, left eye: Secondary | ICD-10-CM

## 2017-11-20 DIAGNOSIS — R001 Bradycardia, unspecified: Secondary | ICD-10-CM | POA: Diagnosis not present

## 2017-11-20 DIAGNOSIS — I959 Hypotension, unspecified: Secondary | ICD-10-CM | POA: Diagnosis not present

## 2017-11-20 DIAGNOSIS — M4184 Other forms of scoliosis, thoracic region: Secondary | ICD-10-CM | POA: Diagnosis present

## 2017-11-20 DIAGNOSIS — Z90721 Acquired absence of ovaries, unilateral: Secondary | ICD-10-CM

## 2017-11-20 LAB — I-STAT ARTERIAL BLOOD GAS, ED
ACID-BASE EXCESS: 6 mmol/L — AB (ref 0.0–2.0)
ACID-BASE EXCESS: 9 mmol/L — AB (ref 0.0–2.0)
Bicarbonate: 33.2 mmol/L — ABNORMAL HIGH (ref 20.0–28.0)
Bicarbonate: 36.1 mmol/L — ABNORMAL HIGH (ref 20.0–28.0)
O2 SAT: 94 %
O2 Saturation: 93 %
PH ART: 7.351 (ref 7.350–7.450)
PO2 ART: 77 mmHg — AB (ref 83.0–108.0)
TCO2: 35 mmol/L — ABNORMAL HIGH (ref 22–32)
TCO2: 38 mmol/L — ABNORMAL HIGH (ref 22–32)
pCO2 arterial: 60.1 mmHg — ABNORMAL HIGH (ref 32.0–48.0)
pCO2 arterial: 63.3 mmHg — ABNORMAL HIGH (ref 32.0–48.0)
pH, Arterial: 7.364 (ref 7.350–7.450)
pO2, Arterial: 71 mmHg — ABNORMAL LOW (ref 83.0–108.0)

## 2017-11-20 LAB — BASIC METABOLIC PANEL
ANION GAP: 11 (ref 5–15)
BUN: 14 mg/dL (ref 6–20)
CALCIUM: 8.9 mg/dL (ref 8.9–10.3)
CHLORIDE: 93 mmol/L — AB (ref 101–111)
CO2: 30 mmol/L (ref 22–32)
CREATININE: 0.7 mg/dL (ref 0.44–1.00)
GFR calc non Af Amer: >60 mL/min (ref >60)
Glucose, Bld: 182 mg/dL — ABNORMAL HIGH (ref 65–99)
Potassium: 4.1 mmol/L (ref 3.5–5.1)
SODIUM: 134 mmol/L — AB (ref 135–145)

## 2017-11-20 LAB — CBC WITH DIFFERENTIAL/PLATELET
BASOS PCT: 0 %
Basophils Absolute: 0 10*3/uL (ref 0.0–0.1)
EOS ABS: 0 10*3/uL (ref 0.0–0.7)
Eosinophils Relative: 0 %
HEMATOCRIT: 36.7 % (ref 36.0–46.0)
Hemoglobin: 11.3 g/dL — ABNORMAL LOW (ref 12.0–15.0)
Lymphocytes Relative: 7 %
Lymphs Abs: 0.5 10*3/uL — ABNORMAL LOW (ref 0.7–4.0)
MCH: 28.1 pg (ref 26.0–34.0)
MCHC: 30.8 g/dL (ref 30.0–36.0)
MCV: 91.3 fL (ref 78.0–100.0)
Monocytes Absolute: 0.5 10*3/uL (ref 0.1–1.0)
Monocytes Relative: 7 %
NEUTROS ABS: 6 10*3/uL (ref 1.7–7.7)
NEUTROS PCT: 86 %
Platelets: 289 10*3/uL (ref 150–400)
RBC: 4.02 MIL/uL (ref 3.87–5.11)
RDW: 17 % — ABNORMAL HIGH (ref 11.5–15.5)
WBC: 7 10*3/uL (ref 4.0–10.5)

## 2017-11-20 LAB — I-STAT CHEM 8, ED
BUN: 17 mg/dL (ref 6–20)
CALCIUM ION: 1.13 mmol/L — AB (ref 1.15–1.40)
CHLORIDE: 91 mmol/L — AB (ref 101–111)
Creatinine, Ser: 0.7 mg/dL (ref 0.44–1.00)
GLUCOSE: 178 mg/dL — AB (ref 65–99)
HCT: 36 % (ref 36.0–46.0)
Hemoglobin: 12.2 g/dL (ref 12.0–15.0)
Potassium: 4.1 mmol/L (ref 3.5–5.1)
SODIUM: 134 mmol/L — AB (ref 135–145)
TCO2: 33 mmol/L — AB (ref 22–32)

## 2017-11-20 LAB — TROPONIN I
Troponin I: <0.03 ng/mL (ref <0.03)
Troponin I: <0.03 ng/mL (ref <0.03)

## 2017-11-20 LAB — BRAIN NATRIURETIC PEPTIDE: B NATRIURETIC PEPTIDE 5: 639.3 pg/mL — AB (ref 0.0–100.0)

## 2017-11-20 LAB — I-STAT TROPONIN, ED: TROPONIN I, POC: 0 ng/mL (ref 0.00–0.08)

## 2017-11-20 LAB — PROTIME-INR
INR: 3.71
PROTHROMBIN TIME: 36.5 s — AB (ref 11.4–15.2)

## 2017-11-20 MED ORDER — IPRATROPIUM-ALBUTEROL 0.5-2.5 (3) MG/3ML IN SOLN
3.0000 mL | Freq: Once | RESPIRATORY_TRACT | Status: AC
  Administered 2017-11-20: 3 mL via RESPIRATORY_TRACT
  Filled 2017-11-20: qty 3

## 2017-11-20 MED ORDER — ACETAMINOPHEN 650 MG RE SUPP: 650.0000 mg | Freq: Four times a day (QID) | RECTAL | Status: DC | PRN

## 2017-11-20 MED ORDER — POLYETHYLENE GLYCOL 3350 17 G PO PACK: 17.0000 g | PACK | Freq: Every day | ORAL | Status: DC | PRN

## 2017-11-20 MED ORDER — FUROSEMIDE 10 MG/ML IJ SOLN
40.0000 mg | Freq: Once | INTRAMUSCULAR | Status: AC
  Administered 2017-11-20: 40 mg via INTRAVENOUS
  Filled 2017-11-20: qty 4

## 2017-11-20 MED ORDER — ACETAMINOPHEN 325 MG PO TABS: 650.0000 mg | ORAL_TABLET | Freq: Four times a day (QID) | ORAL | Status: DC | PRN

## 2017-11-20 MED ORDER — METOPROLOL SUCCINATE ER 100 MG PO TB24: 100.0000 mg | ORAL_TABLET | Freq: Every day | ORAL | Status: DC

## 2017-11-20 MED ORDER — LEVOTHYROXINE SODIUM 137 MCG PO TABS: 137.0000 ug | ORAL_TABLET | Freq: Every day | ORAL | Status: DC

## 2017-11-20 MED ORDER — ONDANSETRON HCL 4 MG/2ML IJ SOLN: 4.0000 mg | Freq: Four times a day (QID) | INTRAMUSCULAR | Status: DC | PRN

## 2017-11-20 MED ORDER — ONDANSETRON HCL 4 MG PO TABS: 4.0000 mg | ORAL_TABLET | Freq: Four times a day (QID) | ORAL | Status: DC | PRN

## 2017-11-20 MED ORDER — HEPARIN SODIUM (PORCINE) 5000 UNIT/ML IJ SOLN
5000.0000 [IU] | Freq: Three times a day (TID) | INTRAMUSCULAR | Status: DC
  Administered 2017-11-20: 5000 [IU] via SUBCUTANEOUS

## 2017-11-20 NOTE — ED Notes (Signed)
Admitting MD at bedside.

## 2017-11-20 NOTE — Progress Notes (Signed)
Pt. Was off the bipap before night shift RT began. Dayshift RT stated that pt. Continuously kept taking the mask off and would not wear the bipap. RT noticed pt. Was on a NRB upon assessment and was anxious. RN placed pt. On Nasal cannula and pt. sats finally came up. Pt. Now getting ready to be transported upstairs. RT has passed on information to floor RT for future assessment.

## 2017-11-20 NOTE — Consult Note (Addendum)
Cardiology Consultation:   Patient ID: JERRICA THORMAN; [id number]; [birth year]   Admit date: 12/02/2017 Date of Consult: 11/29/2017  Primary Care Provider: Deland Pretty, MD Primary Cardiologist: Sanda Klein, MD   Patient Profile:   Wendy Flowers is a 84 y.o. female with a hx of persistent atrial fibrillation on coumadin for anticoagulation, pulmonary hypertension, HLD, dementia and Hypothyroidism  who is being seen today for the evaluation of CHF at the request of Dr. Ardelia Mems.   She has mild to moderate pulmonary hypertension confirmed by right heart catheterization (PA pressure 49/20, mean 30, PA wedge pressure mean 16 mm Hg), but this appears to be stable in severity and is not clearly explained. The most likely cause is restrictive lung disease from fairly significant thoracic kyphoscoliosis in turn related to osteoporosis. She does not have a history of smoking nor signs or symptoms of obstructive sleep apnea and has never had known venous thromboembolic events.  Admitted 08/2017 after a fall at home for atrial fibrillation rapid ventricular response. Plan made for rate control by his primary cardiologist Dr. Sallyanne Kuster given sedentary lifestyle and fragility. Echo showed mildly depressed LVEF- 45-50%, stable compared to previous studies (Consider infiltrative cardiomyopathy in view of severe biatrial enlargement). Her PAH felt to be restrictive lung disease from thoracic kyphoscoliosis. There is no indication for invasive evaluation for CAD. She was discharged to SNF.   She was discharged to assisted living facility on 09/30/17.  History of Present Illness:   Wendy Flowers now presenting from [facility] assisted living facility with SOB. Noted hypoxic by EMS and started on CPAP and breathing treatment. Limited hx due to dementia and on BiPAP. CXR showed pulmonary edema with ? Superimposed pneumonia. Norma Scr and K. BNP 639. Troponin negative. She was given IV lasix 40mg  x 1 and Duoneb nebulizer.  Cardiology is asked for further treatment and management. EKG showed afib at rate of 119 bpm.   Past Medical History:  Diagnosis Date  . Accident due to mechanical fall without injury   . Chronic systolic heart failure ([place])   . Dementia   . HLD (hyperlipidemia)   . HTN (hypertension)   . Hypertensive left ventricular hypertrophy with heart failure ([place])   . Hypothyroidism   . Lower extremity edema   . Osteoporosis   . Permanent atrial fibrillation ([place])   . Pulmonary hypertension ([place])    dx'd by right heart cath/Croituro  . Thoracic compression fracture [facility])     Past Surgical History:  Procedure Laterality Date  . BREAST LUMPECTOMY  2202   left  . CARDIAC CATHETERIZATION  06/13/2010   R & LHC: no CAD  . CATARACT EXTRACTION  2008   bilateral  . [place]   left  . US ECHOCARDIOGRAPHY  02/04/2012   LA mod. dilated,RA mild-mod dilated,mild MR,mod. TR     Inpatient Medications: Scheduled Meds:  Continuous Infusions:  PRN Meds:   Allergies:    Allergies  Allergen Reactions  . Foradil [Formoterol] Other (See Comments)    Reaction not recalled (??)    Social History:   Social History   Socioeconomic History  . Marital status: Widowed    Spouse name: Not on file  . Number of children: Not on file  . Years of education: Not on file  . Highest education level: Not on file  Occupational History  . Not on file  Social Needs  . Financial resource strain: Not on file  . Food insecurity:    Worry:  Not on file    Inability: Not on file  . Transportation needs:    Medical: Not on file    Non-medical: Not on file  Tobacco Use  . Smoking status: Never Smoker  . Smokeless tobacco: Never Used  Substance and Sexual Activity  . Alcohol use: Yes    Comment: seldom; 03/17/2013-occasionally drinks a glass of wine before bed  . Drug use: No  . Sexual activity: Not on file  Lifestyle  . Physical activity:    Days per week: Not on file    Minutes per session:  Not on file  . Stress: Not on file  Relationships  . Social connections:    Talks on phone: Not on file    Gets together: Not on file    Attends religious service: Not on file    Active member of club or organization: Not on file    Attends meetings of clubs or organizations: Not on file    Relationship status: Not on file  . Intimate partner violence:    Fear of current or ex partner: Not on file    Emotionally abused: Not on file    Physically abused: Not on file    Forced sexual activity: Not on file  Other Topics Concern  . Not on file  Social History Narrative  . Not on file    Family History:    Family History  Problem Relation Age of Onset  . Heart attack Mother   . Heart attack Father   . Diabetes Brother   . Cancer Brother      ROS:  Please see the history of present illness.  All other ROS reviewed and negative.     Physical Exam/Data:   Vitals:   11/24/2017 1415 11/13/2017 1430 11/26/2017 1445 12/03/2017 1502  BP: 133/89 123/69 111/61   Pulse: 98 99 74   Resp: 17 20 19    Temp:    (!) 97.5 F (36.4 C)  TempSrc:    Temporal  SpO2: 95% 91% 95%     Intake/Output Summary (Last 24 hours) at 12/01/2017 1548 Last data filed at 12/07/2017 1500 Gross per 24 hour  Intake -  Output 1500 ml  Net -1500 ml    General:  Thin frail chronically ill appearing female on BiPAP HEENT: normal Lymph: no adenopathy Neck: no JVD Endocrine:  No thryomegaly Vascular: No carotid bruits; FA pulses 2+ bilaterally without bruits  Cardiac:  normal S1, S2; irregular irregular; no murmur  Lungs:  Course breath sound with  rales  Abd: soft, nontender, no hepatomegaly  Ext: trace to 1 + pitting edema Musculoskeletal:  No deformities, BUE and BLE strength normal and equal Skin: warm and dry  Neuro:  no focal abnormalities noted Psych: lethargic   EKG:  The EKG was personally reviewed and demonstrates:  AFib at rate of 119 bpm  Relevant CV Studies:  Echo 09/04/17 Study  Conclusions  - Left ventricle: The cavity size was normal. Systolic function was   mildly reduced. The estimated ejection fraction was in the range   of 45% to 50%. Wall motion was normal; there were no regional   wall motion abnormalities. - Ventricular septum: The contour showed systolic flattening. These   changes are consistent with RV pressure overload. - Mitral valve: Mildly calcified, moderately fibrotic annulus.   There was moderate regurgitation. - Left atrium: The atrium was moderately dilated. - Right ventricle: The cavity size was moderately dilated. Wall   thickness was normal.  Systolic function was reduced. - Right atrium: The atrium was severely dilated. - Tricuspid valve: There was moderate regurgitation. - Pulmonary arteries: Systolic pressure was moderately increased.   PA peak pressure: 60 mm Hg (S).  Laboratory Data:  Chemistry Recent Labs  Lab 12/09/2017 1148 11/25/2017 1209  NA 134* 134*  K 4.1 4.1  CL 93* 91*  CO2 30  --   GLUCOSE 182* 178*  BUN 14 17  CREATININE 0.70 0.70  CALCIUM 8.9  --   GFRNONAA >60  --   GFRAA >60  --   ANIONGAP 11  --     Hematology Recent Labs  Lab 11/29/2017 1148 11/13/2017 1209  WBC 7.0  --   RBC 4.02  --   HGB 11.3* 12.2  HCT 36.7 36.0  MCV 91.3  --   MCH 28.1  --   MCHC 30.8  --   RDW 17.0*  --   PLT 289  --     Recent Labs  Lab 11/24/2017 1207  TROPIPOC 0.00    BNP Recent Labs  Lab 11/25/2017 1148  BNP 639.3*     Radiology/Studies:  Dg Chest Port 1 View  Result Date: 11/11/2017 CLINICAL DATA:  Hypoxia and shortness of breath EXAM: PORTABLE CHEST 1 VIEW COMPARISON:  Chest radiograph September 04, 2017 and chest CT September 04, 2017 FINDINGS: There is diffuse interstitial and alveolar opacity in a central distribution throughout the lungs bilaterally. There are pleural effusions bilaterally. There is cardiomegaly with pulmonary venous hypertension. No adenopathy. There is aortic atherosclerosis. No bone lesions.  IMPRESSION: Pulmonary vascular congestion with pleural effusions. Diffuse interstitial alveolar opacity in a central/perihilar distribution, likely representing pulmonary edema. The overall appearance is felt to be most consistent with congestive heart failure. Areas of superimposed pneumonia in areas of alveolar opacity cannot be excluded. There is no adenopathy evident.  There is aortic atherosclerosis. Aortic Atherosclerosis (ICD10-I70.0). Electronically Signed   By: Lowella Grip III M.D.   On: 11/22/2017 12:03    Assessment and Plan:   1. Acute on chronic systolic CHF - Echo last admission 08/2017 showed mildly depressed LVEF to 45-50% which was stable from prior. Per Dr. Croitoru>>consider infiltrative cardiomyopathy in view of severe biatrial enlargement. No indication for ischemic evaluation. - Now presenting with acute exacerbation of CHF with BNP of 639. Continue IV diuresis. Strict I & O and daily weight.   2. Pulmonary hypertension/acute respiratory hypoxic failure  -  Her PAH felt to be restrictive lung disease from thoracic kyphoscoliosis during last admission, mild worsening of PAH compared to previous evaluations.  - Pending echo this admission.   3. Persistent atrial fibrillation with rapid ventricular rate - Rate of 119 on arrival, now stable to 80-90s. Likely due to acute respiratory illness. CHA2DS2/VAS Stroke Risk Score 5 (CHF, HTN, age (2), female). Pt is on warfarin anticoagulation for stroke risk reduction.  - Continue home digoxin 0.125mg  qd and Toprol XL 100mg  qd  4. Failure to thrive - Likely she will need SNF discharge. May be palliative care depending on hospital course.      For questions or updates, please contact [place] Please consult [email or web address] for contact info under Cardiology/STEMI.   Mahalia Longest [place], [place]  12/03/2017 3:48 PM   Attending Note:   The patient was seen and examined.  Agree with assessment and plan as noted above.   Changes made to the above note as needed.  Patient seen and independently examined with Robbie Lis, PA .  We discussed all aspects of the encounter. I agree with the assessment and plan as stated above.  1.  Respiratory distress: The patient presented with respiratory distress today.  She has a history of acute on chronic diastolic congestive heart failure.  He is unable to give any further history.  Tinea supportive care for now.  She is a DO NOT RESUSCITATE.  2.  Acute on chronic combined systolic and diastolic congestive heart failure.  Patient's echocardiogram in January shows systolic heart function with an EF of 45-50%.  She has presumed diastolic dysfunction.  She is in atrial fibrillation so we were not able to tell.  She also has moderate to severe pulmonary hypertension with an estimated PA pressure of 60. To new IV Lasix for now. She is a DNR.   I would not pursue any further evaluation She had an echo in January.  I dont think we need another echo   3.  Pulmonary hypertension: Her pulmonary hypertension is likely multifactorial.  She has known systolic and diastolic congestive heart failure.  She has known kyphosis and likely has a fair amount of hypoventilation. Continue diuresis.  4.  Dementia: Further plans per the family medicine team.     I have spent a total of 40 minutes with patient reviewing hospital  notes , telemetry, EKGs, labs and examining patient as well as establishing an assessment and plan that was discussed with the patient. > 50% of time was spent in direct patient care.    Thayer Headings, Brooke Bonito., MD, [facility] 12/01/2017, 5:01 PM [address]  [place] Pager [phone]

## 2017-11-20 NOTE — ED Notes (Signed)
Patient placed back on NRB for transport to floor.

## 2017-11-20 NOTE — Progress Notes (Signed)
RT assessed patient. Per dayshift RT and ED RT, patient has not been tolerating BIPAP. Patient is currently on 100% non rebreather with spo2 97%. Patient keeps reaching for NRB mask however she has not taken it off. Patient is tolerating NRB well at this time. RT will monitor as needed.

## 2017-11-20 NOTE — Progress Notes (Signed)
Pt ripped bipap mask off at this time, however SPO2 71% on Room Air, Pt placed on NRB with improvement to 93%. Pt continues to reach up to take NRB off. RN made aware. RT will continue to closely monitor pt

## 2017-11-20 NOTE — H&P (Addendum)
[facility] Admission History and Physical Service Pager: [phone]  Patient name: Wendy Flowers Medical record number: [id number] Date of birth: 1927/03/10 Age: 84 y.o. Gender: female  Primary Care Provider: Deland Pretty, MD Consultants: Cardiology Code Status: DNR/DNI (discussed with family on admission)   Chief Complaint: AMS, respiratory failure   Assessment and Plan: Wendy Flowers is a 84 y.o. female presenting with hypoxia, SOB, and decreased mental status. PMH is significant for HFpEF, afib on warfarin, HTN, PAH, dementia, HLD, hypothyroidism, kyphosis, h/o thoracic compression fracture and depression.  Acute Respiratory Failure  CHF exacerbation Presented to ED hypoxic to 70% requiring BiPAP. Vitals with stable BP of 122/77 and HR 98. Afebrile without white count.  She appears lethargic on admission with minimal responsiveness.  Most likely due to acute CHF exacerbation as evidenced by leg swelling, crackles on lung exam and elevated BNP of 639.3.  Pulmonary edema visualized on CXR, although areas of superimposed pneumonia in areas of alveolar opacity cannot be excluded.  Infectious process felt less likely given she is afebrile without white count.  S/p IV Lasix 40mg  x1 given in the ED and per history, pt had been started on po Lasix in the last several days. Most recent ECHO 08/2017 with EF 45-50%. ABG obtained in ED with pCO2 60.1, bicarb 33.2. Per nephew's wife in room, she recently began requiring 2L O2 after a recent hospitalization.  Unlikely this is ACS in nature as initial trop negative and EKG in ED without overt signs of ischemia. Home CHF meds include metoprolol succinate 100mg , digoxin 0.125mg , with recent addition of Lasix 40mg .  - admit to stepdown, attending Dr. Ardelia Mems - cardiac monitoring - currently holding home meds while NPO with bipap; may need to convert to IV dosing if she remains on this  - palliative consulted given DNR/DNI status  discussed on admission  - Cardiology consulted; appreciate recs  - s/p IV Lasix 40mg  x1 in ED; monitor fluid status  - continue BiPAP as needed - consider repeat ABG in AM  - repeat ECHO  - trend trops  - am EKG - Mag level - continuous pulse ox - daily weights - strict I&Os - vitals per unit routine   AMS with [place] Presented to ED lethargic with GCS 7 on admission (withdraws from pain, grunts in response). At baseline is verbal and able to perform some ADLs without difficulty. Most likely due to acute respiratory failure in setting of CHF exacerbation but will obtain urine studies to rule out infection as pt unable to endorse symptoms. CBG 178. - monitor mental status  - U/A, urine culture  Permanent Afib Chronically on Coumadin. INR on admission 3.71.  - heparin per pharmacy  Hypothyroidism Stable, at home on Synthroid 137 mcg.  Last TSH wnl 2.5 in 08/2017.  - holding home med given NPO status - consider converting home meds to IV dosing if remains on Bipap tomorrow   Hyperlipidemia Not on statin. Given age and chronic debilitated status, would not recommend starting.  Depression Unable to assess mood on admission. On Lexapro at home - hold home med    FEN/GI: NPO, SLIV  Prophylaxis: heparin  Disposition: admit to stepdown, attending Dr. Ardelia Mems  History of Present Illness:  Wendy Flowers is a 84 y.o. female presenting with respiratory failure and AMS.  Presented via EMS from [facility]. Per [employer] staff, starting noticing she was less active on Sunday. A doctor saw her Monday and ordered  a CXR, noticed wheezing on exam and low O2 sats and increased her O2 to 3.5L. On Tuesday she looked better but when working with PT, noticed she "wasn't herself." She stayed in her bed most of the day because the oxygen company couldn't deliver her O2 concentrator. On Wednesday a PA saw her but the staff member stated she didn't see documentation from that visit,  and she was started on Lasix 40mg  PO, Prednisone 20mg , and duonebs which she has not gotten. She did receive Lasix and Prednisone yesterday and today. This morning she appeared like herself, ate her breakfast as usual. Then around 10am, OT went to work with her but found that she was not responsive.   Per family (nephew's wife), they last visited her on Sunday when they noticed her ankles were slightly swollen more than usual. Have noticed she has had less of an appetite lately. Stays at [facility] due to recent falls, and has resided there for a few months. Nephew's wife received a call from [facility] at 1130am today regarding her AMS.  At baseline is verbal and able to feed herself without difficulty, requires a walker to walk, needs assistance with bathing, dressing. Her nephew is her POA. She is DNR/DNI, confirmed with family.  Review Of Systems: Unable to obtain.   Patient Active Problem List   Diagnosis Date Noted  . Shortness of breath 11/19/2017  . Loss of appetite 09/25/2017  . Depression 09/25/2017  . Protein-calorie malnutrition, severe 09/07/2017  . Pressure injury of skin 09/05/2017  . Atrial fibrillation with RVR ([place]) 09/04/2017  . Acute respiratory failure with hypoxia ([place]) 09/04/2017  . Fall at home 09/04/2017  . Thoracic compression fracture ([place]) 09/04/2017  . Acute on chronic systolic heart failure ([place]) 09/04/2017  . Acute kidney injury ([place]) 09/04/2017  . Hypothyroidism, adult 09/04/2017  . Dyslipidemia 09/04/2017  . Dementia 09/04/2017  . NICM (nonischemic cardiomyopathy) ([place]) 06/27/2017  . Age-related osteoporosis with current pathological fracture 06/01/2017  . Hypothyroidism 05/14/2017  . Fall 05/13/2017  . Supratherapeutic INR 05/13/2017  . Meningioma ([place]) 05/13/2017  . Chronic systolic CHF (congestive heart failure) ([place]) 05/13/2017  . HLD (hyperlipidemia) 05/13/2017  . Compression fracture of lumbar vertebra ([place]) 05/13/2017  . Restrictive lung  disease due to kyphoscoliosis 06/02/2014  . Chronic anticoagulation 02/28/2014  . Kyphosis 02/28/2014  . Memory changes 05/15/2013  . Essential hypertension 03/22/2010  . Pulmonary arterial hypertension ([place]) 03/22/2010  . Permanent atrial fibrillation ([place]) 03/21/2010   Past Medical History: Past Medical History:  Diagnosis Date  . Accident due to mechanical fall without injury   . Chronic systolic heart failure ([place])   . Dementia   . HLD (hyperlipidemia)   . HTN (hypertension)   . Hypertensive left ventricular hypertrophy with heart failure ([place])   . Hypothyroidism   . Lower extremity edema   . Osteoporosis   . Permanent atrial fibrillation ([place])   . Pulmonary hypertension ([place])    dx'd by right heart cath/Croituro  . Thoracic compression fracture [facility])    Past Surgical History: Past Surgical History:  Procedure Laterality Date  . BREAST LUMPECTOMY  7793   left  . CARDIAC CATHETERIZATION  06/13/2010   R & LHC: no CAD  . CATARACT EXTRACTION  2008   bilateral  . [place]   left  . US ECHOCARDIOGRAPHY  02/04/2012   LA mod. dilated,RA mild-mod dilated,mild MR,mod. TR   Social History: Social History   Tobacco Use  . Smoking status: Never  Smoker  . Smokeless tobacco: Never Used  Substance Use Topics  . Alcohol use: Yes    Comment: seldom; 03/17/2013-occasionally drinks a glass of wine before bed  . Drug use: No   Additional social history: Lives at [facility], no h/o tobacco, alcohol or illicit drug use.   Please also refer to relevant sections of EMR.  Family History: Family History  Problem Relation Age of Onset  . Heart attack Mother   . Heart attack Father   . Diabetes Brother   . Cancer Brother    Allergies and Medications: Allergies  Allergen Reactions  . Foradil [Formoterol] Other (See Comments)    Reaction not recalled (??)   No current facility-administered medications on file prior to encounter.    Current Outpatient Medications on  File Prior to Encounter  Medication Sig Dispense Refill  . Calcium Carb-Cholecalciferol (CALCIUM + D3) 600-200 MG-UNIT TABS Take 1 tablet by mouth daily.     . collagenase (SANTYL) ointment Apply topically daily. Apply Santyl to sacrum and left buttock wound Q day, then cover with moist gauze and foam dressing.  (Change foam dressing Q 3 days or PRN soiling.) 15 g 0  . digoxin (LANOXIN) 0.125 MG tablet Take 1 tablet (0.125 mg total) by mouth daily. 30 tablet 0  . escitalopram (LEXAPRO) 20 MG tablet Take 20 mg by mouth daily.    . feeding supplement, ENSURE ENLIVE, (ENSURE ENLIVE) LIQD Take 237 mLs by mouth 3 (three) times daily between meals. 237 mL 12  . levothyroxine (SYNTHROID, LEVOTHROID) 137 MCG tablet Take 137 mcg by mouth daily before breakfast.    . metoprolol succinate (TOPROL-XL) 100 MG 24 hr tablet Take 1 tablet (100 mg total) by mouth daily. Take with or immediately following a meal. 30 tablet 0  . Multiple Vitamin (MULTIVITAMIN WITH MINERALS) TABS tablet Take 1 tablet by mouth daily.    . OXYGEN Inhale 2 L into the lungs continuous.    Marland Kitchen warfarin (COUMADIN) 2 MG tablet 2mg  by mouth once daily  2  . acetaminophen (TYLENOL) 325 MG tablet Take 650 mg by mouth every 6 (six) hours as needed for moderate pain.     . mirtazapine (REMERON) 7.5 MG tablet Take 7.5 mg by mouth at bedtime.    Marland Kitchen PROLIA 60 MG/ML SOLN injection Inject 60 mg into the skin every 6 (six) months.     Objective: BP 118/70   Pulse (!) 115   Temp (!) 97.5 F (36.4 C) (Temporal)   Resp 19   SpO2 92%    Exam: General: 84 yo lethargic female, lying in bed with Bipap mask on, awakens on exam and able to respond to commands Eyes: EOMI, PERRL Cardiovascular: normal rate and rhythm  Respiratory: crackles appreciated bilaterally in mid to lower lungs, no increased work of breathing and appears comfortable on bipap   Gastrointestinal: soft, nondistended. +BS Derm: no rashes or lesions noted Neuro: GCS 7. Withdraws from  pain, grunts in response. Psych: unable to evaluate   Labs and Imaging: CBC BMET  Recent Labs  Lab 11/19/2017 1148 11/26/2017 1209  WBC 7.0  --   HGB 11.3* 12.2  HCT 36.7 36.0  PLT 289  --    Recent Labs  Lab 11/27/2017 1148 11/18/2017 1209  NA 134* 134*  K 4.1 4.1  CL 93* 91*  CO2 30  --   BUN 14 17  CREATININE 0.70 0.70  GLUCOSE 182* 178*  CALCIUM 8.9  --  Dg Chest Port 1 View  Result Date: 11/15/2017 CLINICAL DATA:  Hypoxia and shortness of breath EXAM: PORTABLE CHEST 1 VIEW COMPARISON:  Chest radiograph September 04, 2017 and chest CT September 04, 2017 FINDINGS: There is diffuse interstitial and alveolar opacity in a central distribution throughout the lungs bilaterally. There are pleural effusions bilaterally. There is cardiomegaly with pulmonary venous hypertension. No adenopathy. There is aortic atherosclerosis. No bone lesions. IMPRESSION: Pulmonary vascular congestion with pleural effusions. Diffuse interstitial alveolar opacity in a central/perihilar distribution, likely representing pulmonary edema. The overall appearance is felt to be most consistent with congestive heart failure. Areas of superimposed pneumonia in areas of alveolar opacity cannot be excluded. There is no adenopathy evident.  There is aortic atherosclerosis. Aortic Atherosclerosis (ICD10-I70.0). Electronically Signed   By: Lowella Grip III M.D.   On: 11/19/2017 12:03   Lovenia Kim, MD 11/26/2017, 9:35 PM PGY-1, [place] [facility] pager: [phone], text pages welcome   I have seen the above patient with Dr. Ky Barban and agree with her documentation.  I have included my edits in blue.   Lovenia Kim, MD [place], PGY -2

## 2017-11-20 NOTE — ED Provider Notes (Signed)
[facility] EMERGENCY DEPARTMENT Provider Note   CSN: [id number] Arrival date & time: 11/18/2017  1146     History   Chief Complaint Chief Complaint  Patient presents with  . Respiratory Distress    HPI Wendy Flowers is a 84 y.o. female.  HPI  84 year old female with a history of A. fib, systolic heart failure, dementia presents with shortness of breath.  The history is very limited by the patient's dyspnea, dementia.  According to EMS they were called because the patient has become progressively dyspneic today.  She was apparently normal this morning.  Now she is less active than typical and short of breath.  When the ambulance arrived, fire department had to put on a nonrebreather and her sats were in the 90s.  She was given a breathing treatment which helped her lung sounds but did not do much for work of breathing.  Was put on CPAP but now tried to take it off.  The patient denies chest pain.  She is otherwise unable to help significantly in the history.  Past Medical History:  Diagnosis Date  . Accident due to mechanical fall without injury   . Chronic systolic heart failure ([place])   . Dementia   . HLD (hyperlipidemia)   . HTN (hypertension)   . Hypertensive left ventricular hypertrophy with heart failure ([place])   . Hypothyroidism   . Lower extremity edema   . Osteoporosis   . Permanent atrial fibrillation ([place])   . Pulmonary hypertension ([place])    dx'd by right heart cath/Croituro  . Thoracic compression fracture [facility])     Patient Active Problem List   Diagnosis Date Noted  . Shortness of breath 12/03/2017  . Loss of appetite 09/25/2017  . Depression 09/25/2017  . Protein-calorie malnutrition, severe 09/07/2017  . Pressure injury of skin 09/05/2017  . Atrial fibrillation with RVR ([place]) 09/04/2017  . Acute respiratory failure with hypoxia ([place]) 09/04/2017  . Fall at home 09/04/2017  . Thoracic compression fracture ([place]) 09/04/2017  . Acute on chronic  systolic heart failure ([place]) 09/04/2017  . Acute kidney injury ([place]) 09/04/2017  . Hypothyroidism, adult 09/04/2017  . Dyslipidemia 09/04/2017  . Dementia 09/04/2017  . NICM (nonischemic cardiomyopathy) ([place]) 06/27/2017  . Age-related osteoporosis with current pathological fracture 06/01/2017  . Hypothyroidism 05/14/2017  . Fall 05/13/2017  . Supratherapeutic INR 05/13/2017  . Meningioma ([place]) 05/13/2017  . Chronic systolic CHF (congestive heart failure) ([place]) 05/13/2017  . HLD (hyperlipidemia) 05/13/2017  . Compression fracture of lumbar vertebra ([place]) 05/13/2017  . Restrictive lung disease due to kyphoscoliosis 06/02/2014  . Chronic anticoagulation 02/28/2014  . Kyphosis 02/28/2014  . Memory changes 05/15/2013  . Essential hypertension 03/22/2010  . Pulmonary arterial hypertension ([place]) 03/22/2010  . Permanent atrial fibrillation ([place]) 03/21/2010    Past Surgical History:  Procedure Laterality Date  . BREAST LUMPECTOMY  9702   left  . CARDIAC CATHETERIZATION  06/13/2010   R & LHC: no CAD  . CATARACT EXTRACTION  2008   bilateral  . [place]   left  . US ECHOCARDIOGRAPHY  02/04/2012   LA mod. dilated,RA mild-mod dilated,mild MR,mod. TR     OB History   None      Home Medications    Prior to Admission medications   Medication Sig Start Date End Date Taking? Authorizing Provider  Calcium Carb-Cholecalciferol (CALCIUM + D3) 600-200 MG-UNIT TABS Take 1 tablet by mouth daily.    Yes [provider]  collagenase (  SANTYL) ointment Apply topically daily. Apply Santyl to sacrum and left buttock wound Q day, then cover with moist gauze and foam dressing.  (Change foam dressing Q 3 days or PRN soiling.) 09/10/17  Yes Mikhail, [place], DO  digoxin (LANOXIN) 0.125 MG tablet Take 1 tablet (0.125 mg total) by mouth daily. 09/10/17  Yes Mikhail, Maryann, DO  escitalopram (LEXAPRO) 20 MG tablet Take 20 mg by mouth daily.   Yes [provider]  feeding  supplement, ENSURE ENLIVE, (ENSURE ENLIVE) LIQD Take 237 mLs by mouth 3 (three) times daily between meals. 09/09/17  Yes Mikhail, [place], DO  levothyroxine (SYNTHROID, LEVOTHROID) 137 MCG tablet Take 137 mcg by mouth daily before breakfast.   Yes [provider]  metoprolol succinate (TOPROL-XL) 100 MG 24 hr tablet Take 1 tablet (100 mg total) by mouth daily. Take with or immediately following a meal. 09/10/17  Yes Mikhail, [place], DO  Multiple Vitamin (MULTIVITAMIN WITH MINERALS) TABS tablet Take 1 tablet by mouth daily. 09/09/17  Yes Mikhail, Maryann, DO  OXYGEN Inhale 2 L into the lungs continuous.   Yes [provider]  warfarin (COUMADIN) 2 MG tablet 2mg  by mouth once daily 06/10/17  Yes [provider]  acetaminophen (TYLENOL) 325 MG tablet Take 650 mg by mouth every 6 (six) hours as needed for moderate pain.     [provider]  mirtazapine (REMERON) 7.5 MG tablet Take 7.5 mg by mouth at bedtime. 09/24/17 10/25/17  [provider]  PROLIA 60 MG/ML SOLN injection Inject 60 mg into the skin every 6 (six) months. 11/14/17   [provider]    Family History Family History  Problem Relation Age of Onset  . Heart attack Mother   . Heart attack Father   . Diabetes Brother   . Cancer Brother     Social History Social History   Tobacco Use  . Smoking status: Never Smoker  . Smokeless tobacco: Never Used  Substance Use Topics  . Alcohol use: Yes    Comment: seldom; 03/17/2013-occasionally drinks a glass of wine before bed  . Drug use: No     Allergies   Foradil [formoterol]   Review of Systems Review of Systems  Unable to perform ROS: Dementia     Physical Exam Updated Vital Signs BP 118/72   Pulse 97   Temp (!) 97.5 F (36.4 C) (Temporal)   Resp (!) 22   SpO2 97%   Physical Exam  Constitutional: She is oriented to person, place, and time. She appears well-developed.  Frail, chronically ill appearing  HENT:  Head:  Normocephalic and atraumatic.  Right Ear: External ear normal.  Left Ear: External ear normal.  Nose: Nose normal.  Eyes: Right eye exhibits no discharge. Left eye exhibits no discharge.  Cardiovascular: Normal heart sounds. An irregular rhythm present. Tachycardia present.  Pulmonary/Chest: Accessory muscle usage present. Tachypnea noted. She is in respiratory distress. She has decreased breath sounds. She has wheezes. She has rales.  Abdominal: Soft. There is no tenderness.  Musculoskeletal: She exhibits no edema.  Neurological: She is alert and oriented to person, place, and time.  Skin: Skin is warm and dry.  Nursing note and vitals reviewed.    ED Treatments / Results  Labs (all labs ordered are listed, but only abnormal results are displayed) Labs Reviewed  BASIC METABOLIC PANEL - Abnormal; Notable for the following components:      Result Value   Sodium 134 (*)    Chloride 93 (*)  Glucose, Bld 182 (*)    All other components within normal limits  CBC WITH DIFFERENTIAL/PLATELET - Abnormal; Notable for the following components:   Hemoglobin 11.3 (*)    RDW 17.0 (*)    Lymphs Abs 0.5 (*)    All other components within normal limits  BRAIN NATRIURETIC PEPTIDE - Abnormal; Notable for the following components:   B Natriuretic Peptide 639.3 (*)    All other components within normal limits  PROTIME-INR - Abnormal; Notable for the following components:   Prothrombin Time 36.5 (*)    All other components within normal limits  I-STAT ARTERIAL BLOOD GAS, ED - Abnormal; Notable for the following components:   pCO2 arterial 60.1 (*)    pO2, Arterial 71.0 (*)    Bicarbonate 33.2 (*)    TCO2 35 (*)    Acid-Base Excess 6.0 (*)    All other components within normal limits  I-STAT CHEM 8, ED - Abnormal; Notable for the following components:   Sodium 134 (*)    Chloride 91 (*)    Glucose, Bld 178 (*)    Calcium, Ion 1.13 (*)    TCO2 33 (*)    All other components within normal  limits  I-STAT ARTERIAL BLOOD GAS, ED - Abnormal; Notable for the following components:   pCO2 arterial 63.3 (*)    pO2, Arterial 77.0 (*)    Bicarbonate 36.1 (*)    TCO2 38 (*)    Acid-Base Excess 9.0 (*)    All other components within normal limits  TROPONIN I  TROPONIN I  TROPONIN I  I-STAT TROPONIN, ED    EKG EKG Interpretation  Date/Time:  Thursday November 20 2017 11:50:34 EDT Ventricular Rate:  119 PR Interval:    QRS Duration: 92 QT Interval:  313 QTC Calculation: 441 R Axis:   105 Text Interpretation:  Atrial fibrillation with RVR Right axis deviation Nonspecific repol abnormality, lateral leads similar to Jan 2019 Confirmed by Sherwood Gambler [phone]) on 11/17/2017 12:11:53 PM Also confirmed by Sherwood Gambler [phone]), editor Hattie Perch [phone])  on 11/21/2017 12:44:07 PM   Radiology Dg Chest Port 1 View  Result Date: 11/29/2017 CLINICAL DATA:  Hypoxia and shortness of breath EXAM: PORTABLE CHEST 1 VIEW COMPARISON:  Chest radiograph September 04, 2017 and chest CT September 04, 2017 FINDINGS: There is diffuse interstitial and alveolar opacity in a central distribution throughout the lungs bilaterally. There are pleural effusions bilaterally. There is cardiomegaly with pulmonary venous hypertension. No adenopathy. There is aortic atherosclerosis. No bone lesions. IMPRESSION: Pulmonary vascular congestion with pleural effusions. Diffuse interstitial alveolar opacity in a central/perihilar distribution, likely representing pulmonary edema. The overall appearance is felt to be most consistent with congestive heart failure. Areas of superimposed pneumonia in areas of alveolar opacity cannot be excluded. There is no adenopathy evident.  There is aortic atherosclerosis. Aortic Atherosclerosis (ICD10-I70.0). Electronically Signed   By: Lowella Grip III M.D.   On: 11/18/2017 12:03    Procedures .Critical Care Performed by: Sherwood Gambler, MD Authorized by: Sherwood Gambler, MD    Critical care provider statement:    Critical care time (minutes):  35   Critical care time was exclusive of:  Separately billable procedures and treating other patients   Critical care was necessary to treat or prevent imminent or life-threatening deterioration of the following conditions:  Cardiac failure and respiratory failure   Critical care was time spent personally by me on the following activities:  Development of treatment plan with patient or surrogate,  discussions with consultants, evaluation of patient's response to treatment, examination of patient, obtaining history from patient or surrogate, ordering and performing treatments and interventions, ordering and review of laboratory studies, ordering and review of radiographic studies, pulse oximetry, re-evaluation of patient's condition and review of old charts   (including critical care time)  Medications Ordered in ED Medications  ipratropium-albuterol (DUONEB) 0.5-2.5 (3) MG/3ML nebulizer solution 3 mL (3 mLs Nebulization Given 12/02/2017 1205)  furosemide (LASIX) injection 40 mg (40 mg Intravenous Given 11/30/2017 1246)     Initial Impression / Assessment and Plan / ED Course  I have reviewed the triage vital signs and the nursing notes.  Pertinent labs & imaging results that were available during my care of the patient were reviewed by me and considered in my medical decision making (see chart for details).     Patient placed on BiPAP after she took the EMS CPAP off.  She does seem to be breathing easier with this.  She has a significant oxygen requirement.  Chest x-ray confirms pulmonary edema and pleural effusions.  With stable electrolytes she was given IV Lasix as this appears to be acute on chronic CHF with significant pulmonary edema.  There is no hypertension.  She does not have any chest pain although the history is quite limited.  While in the ED she did seem to be getting a little sleepier which was hard to tell if she  was more comfortable or getting worse.  Repeat ABG obtained but shows no significant change.  We discussed with family multiple times and they would not want intubation or CPR.  However they okay with BiPAP and trying to reverse this acute process.  However I did discuss the severity of her illness and that she may need more palliative care if not improving.  Final Clinical Impressions(s) / ED Diagnoses   Final diagnoses:  Acute pulmonary edema ([place])  Acute systolic congestive heart failure [facility])    ED Discharge Orders    None       Sherwood Gambler, MD 12/02/2017 1641

## 2017-11-20 NOTE — ED Triage Notes (Signed)
Per EMS- [facility]. Staff called for SOB. Upon EMS arrival pt was decreased LOC responsive to verbal. baseline is dementia. Hx of chf and COPD. Pt reviecved albuterol 5mg . 2 nitro given after rales were heard en route. CPAP applied. 18g LFA.

## 2017-11-20 NOTE — ED Notes (Signed)
Dr. Regenia Skeeter at bedside with family discussing pts condition and decreased mental status.

## 2017-11-21 ENCOUNTER — Other Ambulatory Visit (HOSPITAL_COMMUNITY): Payer: Medicare HMO

## 2017-11-21 DIAGNOSIS — Z7189 Other specified counseling: Secondary | ICD-10-CM

## 2017-11-21 DIAGNOSIS — I5023 Acute on chronic systolic (congestive) heart failure: Secondary | ICD-10-CM

## 2017-11-21 DIAGNOSIS — I2721 Secondary pulmonary arterial hypertension: Secondary | ICD-10-CM

## 2017-11-21 DIAGNOSIS — I5021 Acute systolic (congestive) heart failure: Secondary | ICD-10-CM

## 2017-11-21 DIAGNOSIS — R0602 Shortness of breath: Secondary | ICD-10-CM

## 2017-11-21 LAB — BASIC METABOLIC PANEL
ANION GAP: 10 (ref 5–15)
BUN: 16 mg/dL (ref 6–20)
CALCIUM: 8.6 mg/dL — AB (ref 8.9–10.3)
CHLORIDE: 93 mmol/L — AB (ref 101–111)
CO2: 35 mmol/L — AB (ref 22–32)
Creatinine, Ser: 0.65 mg/dL (ref 0.44–1.00)
GFR calc non Af Amer: >60 mL/min (ref >60)
Glucose, Bld: 117 mg/dL — ABNORMAL HIGH (ref 65–99)
Potassium: 3.6 mmol/L (ref 3.5–5.1)
Sodium: 138 mmol/L (ref 135–145)

## 2017-11-21 LAB — CBC
HEMATOCRIT: 37.6 % (ref 36.0–46.0)
Hemoglobin: 11.2 g/dL — ABNORMAL LOW (ref 12.0–15.0)
MCH: 27.4 pg (ref 26.0–34.0)
MCHC: 29.8 g/dL — ABNORMAL LOW (ref 30.0–36.0)
MCV: 91.9 fL (ref 78.0–100.0)
PLATELETS: 283 10*3/uL (ref 150–400)
RBC: 4.09 MIL/uL (ref 3.87–5.11)
RDW: 17.1 % — ABNORMAL HIGH (ref 11.5–15.5)
WBC: 8 10*3/uL (ref 4.0–10.5)

## 2017-11-21 LAB — URINALYSIS, ROUTINE W REFLEX MICROSCOPIC
BILIRUBIN URINE: NEGATIVE
Glucose, UA: NEGATIVE mg/dL
Hgb urine dipstick: NEGATIVE
KETONES UR: NEGATIVE mg/dL
Leukocytes, UA: NEGATIVE
Nitrite: NEGATIVE
PH: 6 (ref 5.0–8.0)
Protein, ur: NEGATIVE mg/dL
Specific Gravity, Urine: 1.016 (ref 1.005–1.030)

## 2017-11-21 LAB — PROTIME-INR
INR: 3.5
Prothrombin Time: 34.9 seconds — ABNORMAL HIGH (ref 11.4–15.2)

## 2017-11-21 LAB — MAGNESIUM: Magnesium: 1.6 mg/dL — ABNORMAL LOW (ref 1.7–2.4)

## 2017-11-21 LAB — MRSA PCR SCREENING: MRSA by PCR: NEGATIVE

## 2017-11-21 LAB — TROPONIN I: Troponin I: <0.03 ng/mL (ref <0.03)

## 2017-11-21 MED ORDER — CHLORHEXIDINE GLUCONATE 0.12 % MT SOLN
15.0000 mL | Freq: Two times a day (BID) | OROMUCOSAL | Status: DC
  Administered 2017-11-21 – 2017-11-22 (×4): 15 mL via OROMUCOSAL
  Filled 2017-11-21 (×4): qty 15

## 2017-11-21 MED ORDER — MORPHINE SULFATE (CONCENTRATE) 10 MG/0.5ML PO SOLN
2.5000 mg | ORAL | Status: DC | PRN
  Administered 2017-11-22 (×3): 2.6 mg via SUBLINGUAL
  Filled 2017-11-21 (×3): qty 0.5

## 2017-11-21 MED ORDER — MAGNESIUM SULFATE 2 GM/50ML IV SOLN
2.0000 g | Freq: Once | INTRAVENOUS | Status: AC
  Administered 2017-11-21: 2 g via INTRAVENOUS
  Filled 2017-11-21: qty 50

## 2017-11-21 MED ORDER — DIGOXIN 0.25 MG/ML IJ SOLN
100.0000 ug | Freq: Every day | INTRAMUSCULAR | Status: DC
  Administered 2017-11-21 – 2017-11-22 (×2): 100 ug via INTRAVENOUS
  Filled 2017-11-21 (×2): qty 2

## 2017-11-21 MED ORDER — LEVOTHYROXINE SODIUM 100 MCG IV SOLR
75.0000 ug | Freq: Every day | INTRAVENOUS | Status: DC
  Administered 2017-11-21 – 2017-11-22 (×2): 75 ug via INTRAVENOUS
  Filled 2017-11-21 (×3): qty 5

## 2017-11-21 MED ORDER — FUROSEMIDE 10 MG/ML IJ SOLN
40.0000 mg | Freq: Once | INTRAMUSCULAR | Status: AC
  Administered 2017-11-21: 40 mg via INTRAVENOUS
  Filled 2017-11-21: qty 4

## 2017-11-21 MED ORDER — METOPROLOL TARTRATE 5 MG/5ML IV SOLN
5.0000 mg | Freq: Four times a day (QID) | INTRAVENOUS | Status: DC
  Administered 2017-11-21 – 2017-11-22 (×5): 5 mg via INTRAVENOUS
  Filled 2017-11-21 (×5): qty 5

## 2017-11-21 MED ORDER — ORAL CARE MOUTH RINSE
15.0000 mL | Freq: Two times a day (BID) | OROMUCOSAL | Status: DC
  Administered 2017-11-21 – 2017-11-22 (×4): 15 mL via OROMUCOSAL

## 2017-11-21 NOTE — Progress Notes (Addendum)
Family Medicine Teaching Service Daily Progress Note Intern Pager: [phone]  Patient name: Wendy Flowers Medical record number: [id number] Date of birth: 03/25/1927 Age: 84 y.o. Gender: female  Primary Care Provider: Deland Pretty, MD Consultants: Cardiology Code Status: DNR/DNI  Pt Overview and Major Events to Date:  4/11 - admitted for acute respiratory failure  Assessment and Plan: Wendy Flowers is a 84 y.o. female presenting with hypoxia, SOB, and decreased mental status. PMH is significant for HFpEF, afib on warfarin, HTN, PAH, dementia, HLD, hypothyroidism, kyphosis, h/o thoracic compression fracture and depression.  Acute Respiratory Failure  CHF exacerbation Transitioned to NRB mask after self-removing BiPAP yesterday evening, sats wnl overnight, however 82% noted this morning. Slightly tachycardic and tachypneic overnight. Diuresed well yesterday with total output 2.1L, s/p 1 dose IV Lasix 40mg  after receiving home dose 40mg  PO Lasix prior to admission with stable weights. Fluid overloaded on exam today, will provide additional IV Lasix. Trops neg x3. Mag low at 1.6, will replete. U/A negative. Although CXR on admission could not exclude presence on pneumonia, believe this is less likely contributing as patient is afebrile without leukocytosis. Can continue withholding antibiotics. Cardiology consulted who did not recommend obtaining an ECHO given her DNR/DNI status and debilitated state. Did recommend continuing home digoxin and metoprolol, converted to IV. No family in the room at the time of exam, improved mentation today. - replete Mag - restart Metoprolol and digoxin per Cardiology - ECHO ordered - am Mag level - palliative consult - cardiac monitoring - Cardiology consulted; appreciate recs  - s/p IV Lasix 40mg  x1 in ED; monitor fluid status  - continue NRB as needed - IS  - EKG - continuous pulse ox - daily weights - strict I&Os  AMS with Demenia Mentation improved  today. Alert and oriented to self, place. States April for month, March for year. Baseline cognitive impairment with difficulties with short term memory per nephew's wife on admission. Reported to have been pulling off BiPAP last night with hypoxia on RA. Tolerated NRB overnight. U/A not indicative of infection. SLP consulted who recommends continued NPO status - monitor mental status  - f/u urine culture, although no infection seen on U/A - reorientation as needed  Permanent Afib Chronically on Coumadin at home. INR 3.5 today.  - heparin per pharmacy given NPO status  Hypothyroidism Stable, at home on Synthroid 137 mcg.  Last TSH wnl 2.5 in 08/2017.  - continue home Synthroid IV   Hyperlipidemia Not on statin. Given age and chronic debilitated status, would not recommend starting.  Depression Not agitated at time of exam. On Lexapro at home.  - hold home med given NPO status  FEN/GI: NPO, SLIV  Prophylaxis: heparin  Disposition: continue inpatient management of acute resp failure  Subjective:  Patient mentating much better this morning. Alert and oriented to self, place. States April for month, March for year. Denies pain.  Objective: Temp:  [97.5 F (36.4 C)-98.6 F (37 C)] 98.6 F (37 C) (04/12 0815) Pulse Rate:  [52-160] 112 (04/12 0815) Resp:  [15-33] 22 (04/12 0815) BP: (95-147)/(54-95) 131/84 (04/12 0815) SpO2:  [67 %-100 %] 99 % (04/12 0815) FiO2 (%):  [50 %] 50 % (04/11 1527) Weight:  [126 lb 8.7 oz (57.4 kg)-126 lb 15.8 oz (57.6 kg)] 126 lb 8.7 oz (57.4 kg) (04/12 0405) Physical Exam: General: elderly frail female lying in bed, in NAD Cardiovascular: tachycardic, irregular rhythm, no murmurs appreciated Respiratory: bilateral crackles mid to lower lungs, comfortable WOB on  10L Venturi mask  Abdomen: soft, NTND, +BS Extremities: warm and well perfused, 1-2+ pitting edema to shins.  Laboratory: Recent Labs  Lab 11/18/2017 1148 12/05/2017 1209 11/21/17 0341   WBC 7.0  --  8.0  HGB 11.3* 12.2 11.2*  HCT 36.7 36.0 37.6  PLT 289  --  283   Recent Labs  Lab 11/26/2017 1148 11/14/2017 1209 11/21/17 0341  NA 134* 134* 138  K 4.1 4.1 3.6  CL 93* 91* 93*  CO2 30  --  35*  BUN 14 17 16   CREATININE 0.70 0.70 0.65  CALCIUM 8.9  --  8.6*  GLUCOSE 182* 178* 117*    Imaging/Diagnostic Tests: Dg Chest Port 1 View  Result Date: 11/16/2017 CLINICAL DATA:  Hypoxia and shortness of breath EXAM: PORTABLE CHEST 1 VIEW COMPARISON:  Chest radiograph September 04, 2017 and chest CT September 04, 2017 FINDINGS: There is diffuse interstitial and alveolar opacity in a central distribution throughout the lungs bilaterally. There are pleural effusions bilaterally. There is cardiomegaly with pulmonary venous hypertension. No adenopathy. There is aortic atherosclerosis. No bone lesions. IMPRESSION: Pulmonary vascular congestion with pleural effusions. Diffuse interstitial alveolar opacity in a central/perihilar distribution, likely representing pulmonary edema. The overall appearance is felt to be most consistent with congestive heart failure. Areas of superimposed pneumonia in areas of alveolar opacity cannot be excluded. There is no adenopathy evident.  There is aortic atherosclerosis. Aortic Atherosclerosis (ICD10-I70.0). Electronically Signed   By: Lowella Grip III M.D.   On: 11/11/2017 12:03   Rory Percy, DO 11/21/2017, 9:17 AM PGY-1, [place] Intern pager: [phone], text pages welcome

## 2017-11-21 NOTE — Progress Notes (Signed)
  Speech Language Pathology Treatment: Dysphagia  Patient Details Name: Wendy Flowers MRN: [id number] DOB: 08-03-1927 Today's Date: 11/21/2017 Time: 1550-1600 SLP Time Calculation (min) (ACUTE ONLY): 10 min  Assessment / Plan / Recommendation Clinical Impression  New orders were placed for ST to reconsult for possible PO intake d/t increase alertness. SLP responded to emergent request and pt nurse stated that pt continues to be very lethargic with little overall change in alertness. SLP received pt in bed, asleep with 15 liters O2 delivered via non-rebreather. Despite Total A multimodal cues, pt with no increase in alertness, only noticeable change with cues was pt smiled. Recommendation for pt to remain NPO with ice chips following oral care. ST will continue to follow for trials of PO intake. Education provided to nursing.    HPI HPI: Wendy Flowers is a 84 y.o. female presenting with hypoxia, SOB, and decreased mental status. PMH is significant for HFpEF, afib on warfarin, HTN, PAH, dementia, HLD, hypothyroidism, kyphosis, h/o thoracic compression fracture and depression.  Patient was found be be in acute respiratory failure and is currently on a non re-breather.  Most recent chest xray is showing pulmonary vascular congestion with pleural effusions, diffuse insterstial alveolar opacity central/perihilar distribution likely pulmonary edema but can not exclude PNA.        SLP Plan  Continue with current plan of care       Recommendations  Diet recommendations: NPO Medication Administration: Via alternative means                Oral Care Recommendations: Oral care QID;Oral care prior to ice chip/H20 Follow up Recommendations: Other (comment)(TBD- palliative care consulted) SLP Visit Diagnosis: Dysphagia, unspecified (R13.10) Plan: Continue with current plan of care       [place] 11/21/2017, 4:10 PM

## 2017-11-21 NOTE — Progress Notes (Addendum)
Progress Note  Patient Name: Wendy Flowers Date of Encounter: 11/21/2017  Primary Cardiologist:  Sanda Klein, MD  Subjective   84 yo female w/ perm Afib on coumadin, PAH, HLD, dementia andHypothyroidism was admitted 04/11 w/ acute resp failure, cards seeing for CHF  Pt on non-rebreather, poor cough and throat clearing, rouses to verbal, says breathing better today, O2 sats drop when she takes off NRB  Inpatient Medications    Scheduled Meds: . digoxin  100 mcg Intravenous Daily  . levothyroxine  75 mcg Intravenous Daily  . metoprolol tartrate  5 mg Intravenous Q6H   Continuous Infusions:  PRN Meds: acetaminophen **OR** acetaminophen, ondansetron **OR** ondansetron (ZOFRAN) IV, polyethylene glycol   Vital Signs    Vitals:   11/21/17 0405 11/21/17 0438 11/21/17 0500 11/21/17 0815  BP:  123/76  131/84  Pulse:  (!) 133 (!) 118 (!) 112  Resp:  (!) 21 (!) 29 (!) 22  Temp:  (!) 97.5 F (36.4 C)  98.6 F (37 C)  TempSrc:  Oral  Oral  SpO2:  100% (!) 82% 99%  Weight: 126 lb 8.7 oz (57.4 kg)     Height:        Intake/Output Summary (Last 24 hours) at 11/21/2017 1059 Last data filed at 11/21/2017 9892 Gross per 24 hour  Intake -  Output 2100 ml  Net -2100 ml   Filed Weights   11/26/2017 2100 11/21/17 0405  Weight: 126 lb 15.8 oz (57.6 kg) 126 lb 8.7 oz (57.4 kg)    Telemetry    Atrial fib, rate generally > 100 - Personally Reviewed  ECG    04/11 Atrial fib, HR 119, diffuse lateral T wave flattening - Personally Reviewed  Physical Exam   General: frail, elderly female appearing in respiratory distress. Head: Normocephalic, atraumatic.  Neck: Supple without bruits, JVD 12 cm. Lungs:  Resp regular and unlabored, bibasilar rales. Heart: Irreg R&R, S1, S2, no S3, S4, 2/6 murmur; no rub. Abdomen: Soft, non-tender, non-distended with normoactive bowel sounds. No hepatomegaly. No rebound/guarding. No obvious abdominal masses. Extremities: No clubbing, cyanosis, 2+  edema. Distal pedal pulses are 2+ bilaterally. Neuro: Alert and oriented X 2, name and place. Moves all extremities spontaneously.   Labs    Hematology Recent Labs  Lab 11/29/2017 1148 11/22/2017 1209 11/21/17 0341  WBC 7.0  --  8.0  RBC 4.02  --  4.09  HGB 11.3* 12.2 11.2*  HCT 36.7 36.0 37.6  MCV 91.3  --  91.9  MCH 28.1  --  27.4  MCHC 30.8  --  29.8*  RDW 17.0*  --  17.1*  PLT 289  --  283    Chemistry Recent Labs  Lab 12/02/2017 1148 12/03/2017 1209 11/21/17 0341  NA 134* 134* 138  K 4.1 4.1 3.6  CL 93* 91* 93*  CO2 30  --  35*  GLUCOSE 182* 178* 117*  BUN 14 17 16   CREATININE 0.70 0.70 0.65  CALCIUM 8.9  --  8.6*  GFRNONAA >60  --  >60  GFRAA >60  --  >60  ANIONGAP 11  --  10     Cardiac Enzymes Recent Labs  Lab 11/26/2017 1808 11/21/2017 2202 11/21/17 0341  TROPONINI <0.03 <0.03 <0.03    Recent Labs  Lab 11/21/2017 1207  TROPIPOC 0.00     BNP Recent Labs  Lab 11/22/2017 1148  BNP 639.3*    Lab Results  Component Value Date   INR 3.50 11/21/2017   INR  3.71 12/04/2017   INR 2.4 (A) 09/25/2017   PROTIME 25.4 (A) 09/25/2017   PROTIME 44.0 (A) 05/29/2017   PROTIME 18.4 (A) 05/19/2017     Radiology    Dg Chest Port 1 View  Result Date: 12/08/2017 CLINICAL DATA:  Hypoxia and shortness of breath EXAM: PORTABLE CHEST 1 VIEW COMPARISON:  Chest radiograph September 04, 2017 and chest CT September 04, 2017 FINDINGS: There is diffuse interstitial and alveolar opacity in a central distribution throughout the lungs bilaterally. There are pleural effusions bilaterally. There is cardiomegaly with pulmonary venous hypertension. No adenopathy. There is aortic atherosclerosis. No bone lesions. IMPRESSION: Pulmonary vascular congestion with pleural effusions. Diffuse interstitial alveolar opacity in a central/perihilar distribution, likely representing pulmonary edema. The overall appearance is felt to be most consistent with congestive heart failure. Areas of superimposed  pneumonia in areas of alveolar opacity cannot be excluded. There is no adenopathy evident.  There is aortic atherosclerosis. Aortic Atherosclerosis (ICD10-I70.0). Electronically Signed   By: Lowella Grip III M.D.   On: 11/11/2017 12:03     Cardiac Studies   ECHO:  ordered  Patient Profile     84 y.o. female w/ hx perm Afib on coumadin, PAH, HLD, dementia andHypothyroidism was admitted 04/11 w/ acute resp failure, cards seeing for CHF  Assessment & Plan    1. Acute on chronic systolic CHF - Echo last admission 08/2017 showed mildly depressed LVEF to 45-50% which was stable from prior.  - Per Dr. Croitoru>>consider infiltrative cardiomyopathy in view of severe biatrial enlargement. No indication for ischemic evaluation. - Now presenting with acute exacerbation of CHF with BNP of 639.  - I/O net neg 2.1 L so far, wt no change - IM has ordered Continued IV diuresis.  - Strict I & O and daily weight.   2. Pulmonary hypertension/acute respiratory hypoxic failure  -  Her PAH felt to be restrictive lung disease from thoracic kyphoscoliosis during last admission, mild worsening of PAH compared to previous evaluations.  - Pending echo this admission.   3. Persistent atrial fibrillation with rapid ventricular rate - Rate of 119 on arrival, elevated consitently. Likely due to acute respiratory illness. - CHA2DS2/VAS Stroke RiskScore 5 (CHF, HTN, age (83), female). Pt is on warfarin anticoagulation for stroke risk reduction.  - Continue home digoxin 0.125mg  qd and Toprol XL 100mg  qd  4. Failure to thrive - Likely she will need SNF discharge. May need Palliative Care consult, depending on hospital course.   Otherwise, per IM Principal Problem:   Shortness of breath Active Problems:   Pulmonary arterial hypertension (HCC)   Chronic anticoagulation   Acute on chronic systolic heart failure ([place])   Acute pulmonary edema (HCC)  Signed, Rosaria Ferries , PA-C 10:59  AM 11/21/2017 Pager: [phone]  Attending Note:   The patient was seen and examined.  Agree with assessment and plan as noted above.  Changes made to the above note as needed.  Patient seen and independently examined with Rosaria Ferries, PA .   We discussed all aspects of the encounter. I agree with the assessment and plan as stated above.  1.  Acute on chronic systolic congestive heart failure: She is diuresed 2.1 L since admission.  She is off BiPAP and now has a facemask.  O2 saturation remains around 99%.  She denies any chest pain or shortness of breath.  May be very close to baseline.  She is not a candidate for any further invasive evaluation.  2.  Pulmonary hypertension :  Stable.  3.  Persistent atrial fibrillation: Heart rate is slightly better controlled.  Is occasionally above 100 but frequently below 100.  I would agree with palliative care consult.  It appears that she will need to go back to skilled nursing facility.   I have spent a total of 40 minutes with patient reviewing hospital  notes , telemetry, EKGs, labs and examining patient as well as establishing an assessment and plan that was discussed with the patient. > 50% of time was spent in direct patient care.    Thayer Headings, Brooke Bonito., MD, [facility] 11/21/2017, 1:56 PM [address]  [place] Pager [phone]

## 2017-11-21 NOTE — Progress Notes (Signed)
Pt has not been tolerating the BIPAP. The patient is currently on the NRB mask with o2 sats 96%. Will continue to monitor.

## 2017-11-21 NOTE — Evaluation (Signed)
Clinical/Bedside Swallow Evaluation Patient Details  Name: Wendy Flowers MRN: [id number] Date of Birth: 1926-11-21  Today's Date: 11/21/2017 Time: SLP Start Time (ACUTE ONLY): 0850 SLP Stop Time (ACUTE ONLY): 0910 SLP Time Calculation (min) (ACUTE ONLY): 20 min  Past Medical History:  Past Medical History:  Diagnosis Date  . Accident due to mechanical fall without injury   . Chronic systolic heart failure ([place])   . Dementia   . HLD (hyperlipidemia)   . HTN (hypertension)   . Hypertensive left ventricular hypertrophy with heart failure ([place])   . Hypothyroidism   . Lower extremity edema   . Osteoporosis   . Permanent atrial fibrillation ([place])   . Pulmonary hypertension ([place])    dx'd by right heart cath/Croituro  . Thoracic compression fracture [facility])    Past Surgical History:  Past Surgical History:  Procedure Laterality Date  . BREAST LUMPECTOMY  3785   left  . CARDIAC CATHETERIZATION  06/13/2010   R & LHC: no CAD  . CATARACT EXTRACTION  2008   bilateral  . [place]   left  . US ECHOCARDIOGRAPHY  02/04/2012   LA mod. dilated,RA mild-mod dilated,mild MR,mod. TR   HPI:  Wendy Flowers is a 84 y.o. female presenting with hypoxia, SOB, and decreased mental status. PMH is significant for HFpEF, afib on warfarin, HTN, PAH, dementia, HLD, hypothyroidism, kyphosis, h/o thoracic compression fracture and depression.  Patient was found be be in acute respiratory failure and is currently on a non re-breather.  Most recent chest xray is showing pulmonary vascular congestion with pleural effusions, diffuse insterstial alveolar opacity central/perihilar distribution likely pulmonary edema but can not exclude PNA.     Assessment / Plan / Recommendation Clinical Impression  Clinical swallowing evaluation was completed using ice chips, thin liquids via spoon and assisted self fed cup sips and pureed material.  The patient kept her eyes closed entire session but did respond to verbal stimuli  and she attempted to follow commands.  However, she was lethargic requiring constant stimulation to fully participate.  Extensive oral care was provided prior to any PO intake and patient was noted to have oral debris in her mouth that needed to be removed.  Limited oral mechanism exam was completed as the patient struggled to follow all commands.  Lingual and labial range of motion appeared to be adequate.  Strength was unable to assessed.  Volitional cough sounded weak but did allow the patient to clear upper respiratory congestion that was tan and creamy in appearance.   Given PO's the patient was noted to masticate ice chips well in what appeared to be a timely manner.  Swallow trigger appeared to be timely and hyo-laryngeal excursion was appreciated to palpation.  Overt s/s of aspiration were not seen.   However, given the patient's current alertness level and O2 needs recommend that she remain NPO except ice chips with nursing supervision with good oral care.  Suggest use of an incentive spirometer to clearance of secretions.   ST will follow up next date for readiness for PO's vs need for instrumental exam to fully assess swallowing physiology.   SLP Visit Diagnosis: Dysphagia, unspecified (R13.10)    Aspiration Risk  Moderate aspiration risk    Diet Recommendation   NPO  Medication Administration: Via alternative means    Other  Recommendations Oral Care Recommendations: Oral care QID;Oral care prior to ice chip/H20   Follow up Recommendations Other (comment)(TBD)      Frequency and Duration  min 2x/week  2 weeks       Prognosis Prognosis for Safe Diet Advancement: Good      Swallow Study   General Date of Onset: 11/30/2017 HPI: Wendy Flowers is a 84 y.o. female presenting with hypoxia, SOB, and decreased mental status. PMH is significant for HFpEF, afib on warfarin, HTN, PAH, dementia, HLD, hypothyroidism, kyphosis, h/o thoracic compression fracture and depression.  Patient was found be  be in acute respiratory failure and is currently on a non re-breather.  Most recent chest xray is showing pulmonary vascular congestion with pleural effusions, diffuse insterstial alveolar opacity central/perihilar distribution likely pulmonary edema but can not exclude PNA.   Type of Study: Bedside Swallow Evaluation Previous Swallow Assessment: None noted at [facility] Diet Prior to this Study: NPO Temperature Spikes Noted: No Respiratory Status: Non-rebreather History of Recent Intubation: No Behavior/Cognition: Cooperative;Requires cueing;Lethargic/Drowsy Oral Cavity Assessment: Dry;Dried secretions Oral Care Completed by SLP: Yes Oral Cavity - Dentition: Adequate natural dentition Vision: Functional for self-feeding Self-Feeding Abilities: Total assist Patient Positioning: Upright in bed Baseline Vocal Quality: Low vocal intensity Volitional Cough: Weak;Other (Comment)(but allowed for productive clearance of secretions) Volitional Swallow: Able to elicit    Oral/Motor/Sensory Function Overall Oral Motor/Sensory Function: Other (comment)(limited assessment)   Ice Chips Ice chips: Within functional limits Presentation: Spoon   Thin Liquid Thin Liquid: Within functional limits Presentation: Cup;Spoon    Nectar Thick Nectar Thick Liquid: Not tested   Honey Thick Honey Thick Liquid: Not tested   Puree Puree: Within functional limits Presentation: Spoon   Solid   GO   Solid: Not tested        Wendy Flatten, MA, [place] [facility] SLP [phone]  Wendy Flowers 11/21/2017,9:34 AM

## 2017-11-21 NOTE — Consult Note (Addendum)
Consultation Note Date: 11/21/2017   Patient Name: Wendy Flowers  DOB: 1927-07-06  MRN: [id number]  Age / Sex: 84 y.o., female  PCP: Deland Pretty, MD Referring Physician: Leeanne Rio, MD  Reason for Consultation: Establishing goals of care  HPI/Patient Profile: 83 y.o. female  with past medical history of CHF, perm A fib (on coumadin), pulmonary hypertension worsened by kyphosis, dementia, hypothyroidism admitted on 11/26/2017 with SOB, hypoxia requiring Bipap. Workup revealed pulmonary edema, hypoxic respiratory failure r/t CHF exacerbation. Pt currently on NRB. Has advanced directives and DNR in place. Palliative medicine consulted for further [place].    Clinical Assessment and Goals of Care: Patient lying on her side in bed. Smiles when I speak to her. Asks me if I expected to be here today. Cannot tell me where she is. Tells me she feels better, and is "feeling more like I am here now". Wendy Flowers is unable to participate in [place] conversation.   I have reviewed medical records including EPIC notes, labs and imaging,  assessed the patient and then met at the bedside along with patient's niece, nephew and nephew's spouse to discuss diagnosis prognosis, GOC, EOL wishes, disposition and options.  I introduced Palliative Medicine as specialized medical care for people living with serious illness. It focuses on providing relief from the symptoms and stress of a serious illness. The goal is to improve quality of life for both the patient and the family.  We discussed a brief life review of the patient. She is retired from working at [employer]	[place].  She loved being outdoors and animals. She used to have 11 cats. She had a son who was murdered. Her nephews and niece are her only remaining family.   As far as functional and nutritional status - prior to this admission she was living at ALF memory care unit. Walking  with walker- requiring oxygen at all times. There has been notable decrease in appetite. She has good and bad days as far as dementia- can still feed herself, needs some assistance with bathing and dressing.   We discussed their current illness and what it means in the larger context of their on-going co-morbidities.  Natural disease trajectory and expectations at EOL were discussed. Her family notes that priority number one is that their aunt does not suffer. They wish to do nothing to prolong her life that would cause her to suffer.   The difference between aggressive medical intervention and comfort care was considered in light of the patient's goals of care.   Advanced directives, concepts specific to code status, artifical feeding and hydration, and rehospitalization were considered and discussed.   Hospice and Palliative Care services outpatient were explained and offered.  Questions and concerns were addressed.  Hard Choices booklet left for review. The family was encouraged to call with questions or concerns.   Primary Decision Maker NEXT OF KIN- patient's nephew- Wendy Flowers    SUMMARY OF RECOMMENDATIONS   - Continue current level of care without escalating- avoid bipap if possible  as patient did not tolerate-  -Morphine concentrated SL 2.'5mg'$  q2hr prn for SOB symptoms -Recommend trying morphine prior to bipap to increase tolerance and improve SOB symptoms -Family wishes to continue to try and return pt to baseline for next few days, and if she starts to decline or fails to improve then consider transitioning to full comfort care -If possible and she can return to ALF- they are interested in d/c with Hospice care -SLP re-eval as pt much more awake and alert and wanting to drink  Code Status/Advance Care Planning:  DNR  Palliative Prophylaxis:   Delirium Protocol, Frequent Pain Assessment and Turn Reposition  Prognosis:    < 6 months due to advanced pulmonary  hypertension and heart failure, functional decline over the last six months, on chronic O2, increasing hospitalizations over the last several months  Discharge Planning: Home with Hospice  Primary Diagnoses: Present on Admission: . Shortness of breath . Pulmonary arterial hypertension ([place]) . Acute on chronic systolic heart failure ([place])   I have reviewed the medical record, interviewed the patient and family, and examined the patient. The following aspects are pertinent.  Past Medical History:  Diagnosis Date  . Accident due to mechanical fall without injury   . Chronic systolic heart failure ([place])   . Dementia   . HLD (hyperlipidemia)   . HTN (hypertension)   . Hypertensive left ventricular hypertrophy with heart failure ([place])   . Hypothyroidism   . Lower extremity edema   . Osteoporosis   . Permanent atrial fibrillation ([place])   . Pulmonary hypertension ([place])    dx'd by right heart cath/Croituro  . Thoracic compression fracture [facility])    Social History   Socioeconomic History  . Marital status: Widowed    Spouse name: Not on file  . Number of children: Not on file  . Years of education: Not on file  . Highest education level: Not on file  Occupational History  . Not on file  Social Needs  . Financial resource strain: Not on file  . Food insecurity:    Worry: Not on file    Inability: Not on file  . Transportation needs:    Medical: Not on file    Non-medical: Not on file  Tobacco Use  . Smoking status: Never Smoker  . Smokeless tobacco: Never Used  Substance and Sexual Activity  . Alcohol use: Yes    Comment: seldom; 03/17/2013-occasionally drinks a glass of wine before bed  . Drug use: No  . Sexual activity: Not on file  Lifestyle  . Physical activity:    Days per week: Not on file    Minutes per session: Not on file  . Stress: Not on file  Relationships  . Social connections:    Talks on phone: Not on file    Gets together: Not on file    Attends  religious service: Not on file    Active member of club or organization: Not on file    Attends meetings of clubs or organizations: Not on file    Relationship status: Not on file  Other Topics Concern  . Not on file  Social History Narrative  . Not on file   Family History  Problem Relation Age of Onset  . Heart attack Mother   . Heart attack Father   . Diabetes Brother   . Cancer Brother    Scheduled Meds: . chlorhexidine  15 mL Mouth Rinse BID  . digoxin  100 mcg Intravenous Daily  .  levothyroxine  75 mcg Intravenous Daily  . mouth rinse  15 mL Mouth Rinse q12n4p  . metoprolol tartrate  5 mg Intravenous Q6H   Continuous Infusions: PRN Meds:.acetaminophen **OR** acetaminophen, morphine CONCENTRATE, ondansetron **OR** ondansetron (ZOFRAN) IV, polyethylene glycol Medications Prior to Admission:  Prior to Admission medications   Medication Sig Start Date End Date Taking? Authorizing Provider  Calcium Carb-Cholecalciferol (CALCIUM + D3) 600-200 MG-UNIT TABS Take 1 tablet by mouth daily.    Yes [provider]  collagenase (SANTYL) ointment Apply topically daily. Apply Santyl to sacrum and left buttock wound Q day, then cover with moist gauze and foam dressing.  (Change foam dressing Q 3 days or PRN soiling.) 09/10/17  Yes Mikhail, [place], DO  digoxin (LANOXIN) 0.125 MG tablet Take 1 tablet (0.125 mg total) by mouth daily. 09/10/17  Yes Mikhail, Maryann, DO  escitalopram (LEXAPRO) 20 MG tablet Take 20 mg by mouth daily.   Yes [provider]  feeding supplement, ENSURE ENLIVE, (ENSURE ENLIVE) LIQD Take 237 mLs by mouth 3 (three) times daily between meals. 09/09/17  Yes Mikhail, [place], DO  levothyroxine (SYNTHROID, LEVOTHROID) 137 MCG tablet Take 137 mcg by mouth daily before breakfast.   Yes [provider]  metoprolol succinate (TOPROL-XL) 100 MG 24 hr tablet Take 1 tablet (100 mg total) by mouth daily. Take with or immediately following a meal. 09/10/17  Yes  Mikhail, [place], DO  Multiple Vitamin (MULTIVITAMIN WITH MINERALS) TABS tablet Take 1 tablet by mouth daily. 09/09/17  Yes Mikhail, Maryann, DO  OXYGEN Inhale 2 L into the lungs continuous.   Yes [provider]  warfarin (COUMADIN) 2 MG tablet '2mg'$  by mouth once daily 06/10/17  Yes [provider]  acetaminophen (TYLENOL) 325 MG tablet Take 650 mg by mouth every 6 (six) hours as needed for moderate pain.     [provider]  mirtazapine (REMERON) 7.5 MG tablet Take 7.5 mg by mouth at bedtime. 09/24/17 10/25/17  [provider]  PROLIA 60 MG/ML SOLN injection Inject 60 mg into the skin every 6 (six) months. 11/14/17   [provider]   Allergies  Allergen Reactions  . Foradil [Formoterol] Other (See Comments)    Reaction not recalled (??)   Review of Systems  Unable to perform ROS: Dementia    Physical Exam  Constitutional: No distress.  frail  Cardiovascular:  Tachycardic, irregular  Pulmonary/Chest:  Increased RR  Abdominal: Soft.  Neurological:  Answers questions, but not oriented to place or situation  Skin: Skin is warm and dry.  Psychiatric:  Pleasant   Nursing note and vitals reviewed.   Vital Signs: BP 131/84 (BP Location: Left Arm)   Pulse (!) 112   Temp 98.6 F (37 C) (Oral)   Resp (!) 22   Ht '5\' 6"'$  (1.676 m)   Wt 57.4 kg (126 lb 8.7 oz)   SpO2 99%   BMI 20.42 kg/m  Pain Scale: CPOT       SpO2: SpO2: 99 % O2 Device:SpO2: 99 % O2 Flow Rate: .O2 Flow Rate (L/min): 8 L/min  IO: Intake/output summary:   Intake/Output Summary (Last 24 hours) at 11/21/2017 1339 Last data filed at 11/21/2017 0512 Gross per 24 hour  Intake -  Output 2100 ml  Net -2100 ml    LBM: Last BM Date: 11/19/2017 Baseline Weight: Weight: 57.6 kg (126 lb 15.8 oz) Most recent weight: Weight: 57.4 kg (126 lb 8.7 oz)     Palliative Assessment/Data: PPS: 10%  Thank you for this consult. Palliative medicine will continue to follow and  assist as needed.   Time In: 1230 Time Out: 1345 Time Total: 75 mins Greater than 50%  of this time was spent counseling and coordinating care related to the above assessment and plan.  Signed by: Mariana Kaufman, AGNP-C Palliative Medicine    Please contact Palliative Medicine Team phone at [phone] for questions and concerns.  For individual provider: See Shea Evans

## 2017-11-21 NOTE — Plan of Care (Signed)
Patient currently minimally responsive and not interactive to commands and questions. Patient not awake and alert enough to swallow so patinet is still NPO per speech therapy. HR still in afib with a rate in 110a and 120s.

## 2017-11-21 NOTE — Care Management Note (Signed)
Case Management Note  Patient Details  Name: Wendy Flowers MRN: [id number] Date of Birth: 03/21/1927  Subjective/Objective:   Pt admitted with Resp Distress                  Action/Plan:  PTA at ALF.  Palliative is involved and pt may be appropriate for SNF with hospice at discharge.  CSW following   Expected Discharge Date:  11/24/17               Expected Discharge Plan:  Assisted Living / Rest Home  In-House Referral:  Clinical Social Work  Discharge planning Services  CM Consult  Post Acute Care Choice:    Choice offered to:     DME Arranged:    DME Agency:     HH Arranged:    HH Agency:     Status of Service:     If discussed at [employer], dates discussed:    Additional Comments:  Maryclare Labrador, RN 11/21/2017, 4:14 PM

## 2017-11-22 ENCOUNTER — Inpatient Hospital Stay (HOSPITAL_COMMUNITY): Payer: Medicare HMO

## 2017-11-22 DIAGNOSIS — I4891 Unspecified atrial fibrillation: Secondary | ICD-10-CM

## 2017-11-22 LAB — URINE CULTURE: Culture: NO GROWTH

## 2017-11-22 LAB — CBC
HCT: 37.2 % (ref 36.0–46.0)
HEMOGLOBIN: 11.2 g/dL — AB (ref 12.0–15.0)
MCH: 27.7 pg (ref 26.0–34.0)
MCHC: 30.1 g/dL (ref 30.0–36.0)
MCV: 91.9 fL (ref 78.0–100.0)
PLATELETS: 277 10*3/uL (ref 150–400)
RBC: 4.05 MIL/uL (ref 3.87–5.11)
RDW: 16.6 % — AB (ref 11.5–15.5)
WBC: 9.1 10*3/uL (ref 4.0–10.5)

## 2017-11-22 LAB — PROTIME-INR
INR: 3.53
PROTHROMBIN TIME: 35.1 s — AB (ref 11.4–15.2)

## 2017-11-22 LAB — BASIC METABOLIC PANEL
Anion gap: 12 (ref 5–15)
BUN: 18 mg/dL (ref 6–20)
CALCIUM: 8.1 mg/dL — AB (ref 8.9–10.3)
CHLORIDE: 92 mmol/L — AB (ref 101–111)
CO2: 33 mmol/L — ABNORMAL HIGH (ref 22–32)
CREATININE: 0.58 mg/dL (ref 0.44–1.00)
GFR calc Af Amer: >60 mL/min (ref >60)
Glucose, Bld: 79 mg/dL (ref 65–99)
Potassium: 3.4 mmol/L — ABNORMAL LOW (ref 3.5–5.1)
SODIUM: 137 mmol/L (ref 135–145)

## 2017-11-22 LAB — MAGNESIUM: Magnesium: 2 mg/dL (ref 1.7–2.4)

## 2017-11-22 MED ORDER — METOPROLOL TARTRATE 5 MG/5ML IV SOLN
7.5000 mg | Freq: Four times a day (QID) | INTRAVENOUS | Status: DC
  Administered 2017-11-22 – 2017-11-23 (×4): 7.5 mg via INTRAVENOUS
  Filled 2017-11-22 (×4): qty 10

## 2017-11-22 MED ORDER — LORAZEPAM 1 MG PO TABS: 1.0000 mg | ORAL_TABLET | ORAL | Status: DC | PRN

## 2017-11-22 MED ORDER — HALOPERIDOL LACTATE 2 MG/ML PO CONC
0.5000 mg | ORAL | Status: DC | PRN
  Filled 2017-11-22: qty 0.3

## 2017-11-22 MED ORDER — SCOPOLAMINE 1 MG/3DAYS TD PT72
1.0000 | MEDICATED_PATCH | TRANSDERMAL | Status: DC
  Administered 2017-11-22: 1.5 mg via TRANSDERMAL
  Filled 2017-11-22 (×2): qty 1

## 2017-11-22 MED ORDER — AMIODARONE HCL IN DEXTROSE 360-4.14 MG/200ML-% IV SOLN
60.0000 mg/h | INTRAVENOUS | Status: AC
  Administered 2017-11-22 (×2): 60 mg/h via INTRAVENOUS
  Filled 2017-11-22 (×2): qty 200

## 2017-11-22 MED ORDER — AMIODARONE HCL IN DEXTROSE 360-4.14 MG/200ML-% IV SOLN
30.0000 mg/h | INTRAVENOUS | Status: DC
  Administered 2017-11-22: 30 mg/h via INTRAVENOUS

## 2017-11-22 MED ORDER — LORAZEPAM 2 MG/ML IJ SOLN
1.0000 mg | INTRAMUSCULAR | Status: DC | PRN
  Administered 2017-11-22 (×2): 1 mg via INTRAVENOUS
  Filled 2017-11-22 (×2): qty 1

## 2017-11-22 MED ORDER — AMIODARONE LOAD VIA INFUSION
150.0000 mg | Freq: Once | INTRAVENOUS | Status: AC
  Administered 2017-11-22: 150 mg via INTRAVENOUS
  Filled 2017-11-22: qty 83.34

## 2017-11-22 MED ORDER — MORPHINE SULFATE (PF) 2 MG/ML IV SOLN
1.0000 mg | INTRAVENOUS | Status: DC | PRN
  Administered 2017-11-22 – 2017-11-23 (×3): 1 mg via INTRAVENOUS
  Filled 2017-11-22 (×3): qty 1

## 2017-11-22 MED ORDER — HALOPERIDOL 0.5 MG PO TABS: 0.5000 mg | ORAL_TABLET | ORAL | Status: DC | PRN

## 2017-11-22 MED ORDER — FUROSEMIDE 10 MG/ML IJ SOLN
80.0000 mg | Freq: Two times a day (BID) | INTRAMUSCULAR | Status: DC
  Administered 2017-11-22: 80 mg via INTRAVENOUS
  Filled 2017-11-22: qty 8

## 2017-11-22 MED ORDER — LORAZEPAM 2 MG/ML PO CONC: 1.0000 mg | ORAL | Status: DC | PRN

## 2017-11-22 MED ORDER — POLYVINYL ALCOHOL 1.4 % OP SOLN
1.0000 [drp] | Freq: Four times a day (QID) | OPHTHALMIC | Status: DC | PRN
  Filled 2017-11-22: qty 15

## 2017-11-22 MED ORDER — HALOPERIDOL LACTATE 5 MG/ML IJ SOLN: 0.5000 mg | INTRAMUSCULAR | Status: DC | PRN

## 2017-11-22 NOTE — Progress Notes (Signed)
SLP Cancellation Note  Patient Details Name: Wendy Flowers MRN: [id number] DOB: 1927-06-05   Cancelled treatment:       Reason Eval/Treat Not Completed: Fatigue/lethargy limiting ability to participate. Checked in with RN. Will plan to f/u on Monday.    Yoshimi Sarr, Katherene Ponto 11/22/2017, 10:47 AM

## 2017-11-22 NOTE — Progress Notes (Signed)
Progress Note  Patient Name: Wendy Flowers Date of Encounter: 11/22/2017  Primary Cardiologist: Wendy Klein, MD   Subjective   Unable to obtain.  Patient sleepy but arousable.   Inpatient Medications    Scheduled Meds: . chlorhexidine  15 mL Mouth Rinse BID  . digoxin  100 mcg Intravenous Daily  . levothyroxine  75 mcg Intravenous Daily  . mouth rinse  15 mL Mouth Rinse q12n4p  . metoprolol tartrate  5 mg Intravenous Q6H   Continuous Infusions:  PRN Meds: acetaminophen **OR** acetaminophen, morphine CONCENTRATE, ondansetron **OR** ondansetron (ZOFRAN) IV, polyethylene glycol   Vital Signs    Vitals:   11/22/17 0400 11/22/17 0500 11/22/17 0600 11/22/17 0811  BP:    (!) 148/78  Pulse: 96 (!) 114 (!) 113 (!) 111  Resp: (!) 28 (!) 21 19 (!) 24  Temp:    98.4 F (36.9 C)  TempSrc:    Oral  SpO2: 92% 96% 98% 98%  Weight:      Height:        Intake/Output Summary (Last 24 hours) at 11/22/2017 1119 Last data filed at 11/21/2017 1526 Gross per 24 hour  Intake -  Output 1000 ml  Net -1000 ml   Filed Weights   11/22/2017 2100 11/21/17 0405 11/22/17 0301  Weight: 126 lb 15.8 oz (57.6 kg) 126 lb 8.7 oz (57.4 kg) 126 lb 1.7 oz (57.2 kg)    Telemetry    Atrial fibrillation.  Rate 110-150s.  - Personally Reviewed  ECG    N/a  - Personally Reviewed  Physical Exam   VS:  BP (!) 148/78   Pulse (!) 111   Temp 98.4 F (36.9 C) (Oral)   Resp (!) 24   Ht 5\' 6"  (1.676 m)   Wt 126 lb 1.7 oz (57.2 kg)   SpO2 98%   BMI 20.35 kg/m  , BMI Body mass index is 20.35 kg/m. GENERAL:  Frail, elderly woman sleeping in no acute distress.  HEENT: Pupils equal round and reactive, fundi not visualized, oral mucosa unremarkable NECK:  No jugular venous distention, waveform within normal limits, carotid upstroke brisk and symmetric, no bruits LUNGS:  Clear to auscultation bilaterally on anterior exam.  HEART:  Tachycardic.  Irregularly irregular.   PMI not displaced or sustained,S1  and S2 within normal limits, no S3, no S4, no clicks, no rubs, no murmurs ABD:  Flat, positive bowel sounds normal in frequency in pitch, no bruits, no rebound, no guarding, no midline pulsatile mass, no hepatomegaly, no splenomegaly EXT:  2 plus pulses throughout, no edema, no cyanosis no clubbing SKIN:  No rashes no nodules NEURO:  Cranial nerves II through XII grossly intact, motor grossly intact throughout [facility]:  Cognitively intact, oriented to person place and time   Labs    Chemistry Recent Labs  Lab 11/26/2017 1148 11/27/2017 1209 11/21/17 0341 11/22/17 0233  NA 134* 134* 138 137  K 4.1 4.1 3.6 3.4*  CL 93* 91* 93* 92*  CO2 30  --  35* 33*  GLUCOSE 182* 178* 117* 79  BUN 14 17 16 18   CREATININE 0.70 0.70 0.65 0.58  CALCIUM 8.9  --  8.6* 8.1*  GFRNONAA >60  --  >60 >60  GFRAA >60  --  >60 >60  ANIONGAP 11  --  10 12     Hematology Recent Labs  Lab 11/26/2017 1148 11/28/2017 1209 11/21/17 0341 11/22/17 0233  WBC 7.0  --  8.0 9.1  RBC 4.02  --  4.09 4.05  HGB 11.3* 12.2 11.2* 11.2*  HCT 36.7 36.0 37.6 37.2  MCV 91.3  --  91.9 91.9  MCH 28.1  --  27.4 27.7  MCHC 30.8  --  29.8* 30.1  RDW 17.0*  --  17.1* 16.6*  PLT 289  --  283 277    Cardiac Enzymes Recent Labs  Lab 11/16/2017 1808 12/02/2017 2202 11/21/17 0341  TROPONINI <0.03 <0.03 <0.03    Recent Labs  Lab 11/10/2017 1207  TROPIPOC 0.00     BNP Recent Labs  Lab 12/09/2017 1148  BNP 639.3*     DDimer No results for input(s): DDIMER in the last 168 hours.   Radiology    Dg Chest Port 1 View  Result Date: 11/10/2017 CLINICAL DATA:  Hypoxia and shortness of breath EXAM: PORTABLE CHEST 1 VIEW COMPARISON:  Chest radiograph September 04, 2017 and chest CT September 04, 2017 FINDINGS: There is diffuse interstitial and alveolar opacity in a central distribution throughout the lungs bilaterally. There are pleural effusions bilaterally. There is cardiomegaly with pulmonary venous hypertension. No adenopathy. There  is aortic atherosclerosis. No bone lesions. IMPRESSION: Pulmonary vascular congestion with pleural effusions. Diffuse interstitial alveolar opacity in a central/perihilar distribution, likely representing pulmonary edema. The overall appearance is felt to be most consistent with congestive heart failure. Areas of superimposed pneumonia in areas of alveolar opacity cannot be excluded. There is no adenopathy evident.  There is aortic atherosclerosis. Aortic Atherosclerosis (ICD10-I70.0). Electronically Signed   By: Lowella Grip III M.D.   On: 11/22/2017 12:03    Cardiac Studies   Echo 09/04/17: Study Conclusions  - Left ventricle: The cavity size was normal. Systolic function was   mildly reduced. The estimated ejection fraction was in the range   of 45% to 50%. Wall motion was normal; there were no regional   wall motion abnormalities. - Ventricular septum: The contour showed systolic flattening. These   changes are consistent with RV pressure overload. - Mitral valve: Mildly calcified, moderately fibrotic annulus.   There was moderate regurgitation. - Left atrium: The atrium was moderately dilated. - Right ventricle: The cavity size was moderately dilated. Wall   thickness was normal. Systolic function was reduced. - Right atrium: The atrium was severely dilated. - Tricuspid valve: There was moderate regurgitation. - Pulmonary arteries: Systolic pressure was moderately increased.   PA peak pressure: 60 mm Hg (S).   Patient Profile     84 y.o. female heart failure, persistent atrial fibrillation, pulmonary arterial hypertensionwith chronic admitted with acute hypoxic respiratory failure and diastolic heart failure.  Assessment & Plan    # Acute on chronic systolic and diastolic heart failure: Ms. Wendy Flowers appears to be euvolemic on exam.  Renal function is stable.  Diuresis has been held.  She is not taking any IV medications.  Continue digoxin and increase metoprolol to 7.5 mg IV  every 6 hours.  She has been seen by palliative care and there are plans not to escalate her care.  She is  not a candidate for any further cardiac interventions.  Are poorly controlled.  Rates are poorly controlled.  Continue digoxin and increase metoprolol to 30 mg every 6 hours.  If this does not control her heart rate remained to start amiodarone.   # PAH: # Acute hypoxic respiratory failure:  She does not want BiPap.  continue non-rebreather.   For questions or updates, please contact [place] Please consult [email or web address] for contact info under Cardiology/STEMI.  Signed, Skeet Latch, MD  11/22/2017, 11:19 AM

## 2017-11-22 NOTE — Progress Notes (Signed)
Family Medicine Teaching Service Daily Progress Note Intern Pager: [phone]  Patient name: Wendy Flowers Medical record number: [id number] Date of birth: [birth year] Age: 84 y.o. Gender: female  Primary Care Provider: Deland Pretty, MD Consultants: Cardiology Code Status: DNR/DNI  Pt Overview and Major Events to Date:  4/11 - admitted for acute respiratory failure  Assessment and Plan: Wendy Flowers is a 84 y.o. female presenting with hypoxia, SOB, and decreased mental status. PMH is significant for HFpEF, afib on warfarin, HTN, PAH, dementia, HLD, hypothyroidism, kyphosis, h/o thoracic compression fracture and depression.  Acute Respiratory Failure  CHF exacerbation Remains on NRB mask and persistently tachycardic. Diuresed well the last 2 days 3.1L, s/p 2 doses IV Lasix 40mg . Appears euvolemic today but still intermittently tachypneic. Trops neg x3. Although CXR on admission could not exclude presence of pneumonia, believe this is less likely contributing as patient remains afebrile without leukocytosis. Can continue withholding antibiotics. Cardiology consulted who did not recommend obtaining an ECHO given her DNR/DNI status and debilitated state. Did recommend continuing home digoxin and metoprolol, converted to IV. - Increase IV Metoprolol to 7.5 mg q6h and continue digoxin per Cardiology; remained tachycardic this afternoon, so started amiodarone after discussing with Dr. Oval Linsey - am Mag level - palliative consulted, appreciate recs; family confirmed again today that they would like SL morphine to be given in hopes of helping patient get off of NRB mask - cardiac monitoring - Cardiology consulted; appreciate recs  - s/p IV Lasix 40mg  x2; will order 80 mg IV - continue NRB as needed - IS  - continuous pulse ox - daily weights - strict I&Os - Consider repeat CXR in a.m.  AMS with Demenia Mentation somewhat worsened from yesterday, arousable and alert and oriented to self, place. Does  not know month. Baseline cognitive impairment with difficulties with short term memory per nephew's wife on admission. Tolerated NRB overnight; pulled off bipap night prior. U/A not indicative of infection. SLP consulted who recommends continued NPO status - monitor mental status  - urine culture negative - reorientation as needed  Permanent Afib Chronically on Coumadin at home. INR 3.5 today.  - heparin per pharmacy given NPO status  Hypothyroidism Stable, at home on Synthroid 137 mcg.  Last TSH wnl 2.5 in 08/2017.  - continue home Synthroid IV   Hyperlipidemia Not on statin. Given age and chronic debilitated status, would not recommend starting.  Depression Not agitated at time of exam. On Lexapro at home.  - hold home med given NPO status  FEN/GI: NPO, SLIV  Prophylaxis: heparin  Disposition: continue inpatient management of acute resp failure  Subjective:  Patient arousable but quickly goes back to sleep. Denies pain. Denies shortness of breath. Family members who are present say they would not expect her to complain, however. Family members (wife of nephew who is HCPOA and niece) understand patient's status is tenuous but hope she may recover, as presentation this hospitalization is similar to one earlier this year.   Objective: Temp:  [97.5 F (36.4 C)-98.6 F (37 C)] 97.8 F (36.6 C) (04/13 1637) Pulse Rate:  [49-130] 127 (04/13 1637) Resp:  [17-30] 24 (04/13 1637) BP: (123-148)/(70-93) 138/93 (04/13 1637) SpO2:  [92 %-98 %] 97 % (04/13 1637) FiO2 (%):  [100 %] 100 % (04/12 2218) Weight:  [126 lb 1.7 oz (57.2 kg)] 126 lb 1.7 oz (57.2 kg) (04/13 0301) Physical Exam: General: elderly frail female lying in bed, in NAD Cardiovascular: tachycardic, irregular rhythm, no murmurs appreciated Respiratory:  coarse breath sounds anteriorly and decreased breath sounds posteriorly, on NRB Abdomen: soft, NTND, +BS Extremities: warm and well perfused, no pitting edema    Laboratory: Recent Labs  Lab 11/22/2017 1148 12/08/2017 1209 11/21/17 0341 11/22/17 0233  WBC 7.0  --  8.0 9.1  HGB 11.3* 12.2 11.2* 11.2*  HCT 36.7 36.0 37.6 37.2  PLT 289  --  283 277   Recent Labs  Lab 11/19/2017 1148 11/18/2017 1209 11/21/17 0341 11/22/17 0233  NA 134* 134* 138 137  K 4.1 4.1 3.6 3.4*  CL 93* 91* 93* 92*  CO2 30  --  35* 33*  BUN 14 17 16 18   CREATININE 0.70 0.70 0.65 0.58  CALCIUM 8.9  --  8.6* 8.1*  GLUCOSE 182* 178* 117* 79    Imaging/Diagnostic Tests: Dg Chest Port 1 View  Result Date: 11/10/2017 CLINICAL DATA:  Hypoxia and shortness of breath EXAM: PORTABLE CHEST 1 VIEW COMPARISON:  Chest radiograph September 04, 2017 and chest CT September 04, 2017 FINDINGS: There is diffuse interstitial and alveolar opacity in a central distribution throughout the lungs bilaterally. There are pleural effusions bilaterally. There is cardiomegaly with pulmonary venous hypertension. No adenopathy. There is aortic atherosclerosis. No bone lesions. IMPRESSION: Pulmonary vascular congestion with pleural effusions. Diffuse interstitial alveolar opacity in a central/perihilar distribution, likely representing pulmonary edema. The overall appearance is felt to be most consistent with congestive heart failure. Areas of superimposed pneumonia in areas of alveolar opacity cannot be excluded. There is no adenopathy evident.  There is aortic atherosclerosis. Aortic Atherosclerosis (ICD10-I70.0). Electronically Signed   By: Lowella Grip III M.D.   On: 11/15/2017 12:03   Rogue Bussing, MD 11/22/2017, 5:04 PM PGY-3, [place] Intern pager: [phone], text pages welcome

## 2017-11-22 NOTE — Progress Notes (Signed)
Spoke to FMTS regarding patient's decrease in heart rate.  Order to stop amiodarone was obtained.  New order with perameters was obtained.  Call will be placed to [name] POA to update him on patient's status.

## 2017-11-22 NOTE — Plan of Care (Signed)
Patient is in the end of life phase. Patient is not participating in education or care at this time.

## 2017-11-22 NOTE — Progress Notes (Signed)
Visited patient at bedside due to decreased SpO2 to mid-80s on NRB mask. Patient with somewhat increased WOB and agitation. Had received SL morphine within the hour. Family (nephew, Derry Skill, his wife, and niece) present and in agreement that patient would want to be comfortable. They are still hopeful she may improve given recent hospitalization when she presented similarly and was able to discharge to SNF. However, they acknowledge that her work of breathing seems worsened and that medications have not done much to improve tachycardia. Lasix 80 IV given. CXR ordered. Discussed why antibiotics did not seem like a therapeutic option (no leukocytosis or fever and downside of worsening pulmonary congestion). Family would like to continue medications to lower heart heart (amiodarone drip, IV metoprolol, IV digoxin). They would also like bipap to be tried if O2 sats stayed below 90 and if patient can tolerate it. Family in agreement with ordering comfort care medications (discussed IV morphine, ativan, haldol). Nephew would like to be called if patient has significant status change.  About 1.5 hours later, CXR showed worsening pulmonary edema compared to CXR 4/11. Patient alert and somewhat agitated, occasionally grabbing mask and handrails of bed. Discussed patient with her evening nurse who also had noted some agitation. To give morphine and ativan prn.   Olene Floss, MD [place], PGY-3

## 2017-11-22 NOTE — Progress Notes (Signed)
Attempted to call [name], patient's nephew and POA for update in patient's condition.  No answer at this time.

## 2017-11-22 NOTE — Progress Notes (Signed)
Call placed to [name], nephew and POA.  No answer with no telephone answering machine.

## 2017-11-23 MED ORDER — FUROSEMIDE 10 MG/ML IJ SOLN
40.0000 mg | Freq: Once | INTRAMUSCULAR | Status: AC
  Administered 2017-11-23: 40 mg via INTRAVENOUS
  Filled 2017-11-23: qty 4

## 2017-11-28 DIAGNOSIS — L8990 Pressure ulcer of unspecified site, unspecified stage: Secondary | ICD-10-CM | POA: Diagnosis not present

## 2017-11-28 DIAGNOSIS — I5023 Acute on chronic systolic (congestive) heart failure: Secondary | ICD-10-CM | POA: Diagnosis not present

## 2017-11-28 DIAGNOSIS — J9601 Acute respiratory failure with hypoxia: Secondary | ICD-10-CM | POA: Diagnosis not present

## 2017-12-10 NOTE — Discharge Summary (Signed)
[facility] Death Summary  Patient name: Wendy Flowers Medical record number: [id number] Date of birth: 1927/06/20 Age: 84 y.o. Gender: female Date of Admission: 12-18-17  Date of Death: 21-Dec-2017 Admitting Physician: Leeanne Rio, MD  Primary Care Provider: Deland Pretty, MD Consultants: Cardiology  Indication for Hospitalization: acute respiratory failure  Discharge Diagnoses/Problem List:  Acute Respiratory Failure CHF exacerbation AMS with Dementia Permanent Afib Hypothyroidism Hyperlipidemia Depression  Physical Exam:  General: elderly frail female lying in bed, not responsive to voice Cardiovascular: irregular rhythm, regular rate. no murmurs appreciated Respiratory:distant breath sounds, on BiPAP Abdomen: soft, NTND, +BS Extremities: nonpitting edematous ankles, cold lower extremities (performed earlier on the day of passing)  Brief Hospital Course:  Wendy Flowers a 84 y.o.female with PMH significant forHFpEF, afib on warfarin, HTN, PAH, dementia, HLD, hypothyroidism,kyphosis, h/o thoracic compression fracture and depression whopresented hypoxic to 70%, fluid overloaded with minimal responsiveness requiring BiPAP. CXR on admission with diffuse pulmonary edema but could not exclude superimposed pneumonia, however remained afebrile with no leukocytosis throughout hospitalization, therefore antibiotics were not initiated. She responded well initially to a few doses of IV Lasix with good urine output and was able to transition to a nonrebreather mask. However, patient again became hypoxic to low 70s and again required BiPAP and PRN morphine for air hunger. Initially requiring amiodarone drip for tachycardia, her heart rate also dropped to 80s and amiodarone drip was stopped. Patient became hypotensive and bradycardic shortly before passing. Palliative consulted early in hospitalization and met with family who confirmed patient's DNR/DNI status.  Family was at bedside at the time of death, requesting removal of BiPAP. Comfort was provided to the family.  Significant Procedures: None  Significant Labs and Imaging:  Recent Labs  Lab Dec 18, 2017 1148 Dec 18, 2017 1209 11/21/17 0341 11/22/17 0233  WBC 7.0  --  8.0 9.1  HGB 11.3* 12.2 11.2* 11.2*  HCT 36.7 36.0 37.6 37.2  PLT 289  --  283 277   Recent Labs  Lab 12-18-17 1148 12-18-2017 1209 11/21/17 0341 11/22/17 0233  NA 134* 134* 138 137  K 4.1 4.1 3.6 3.4*  CL 93* 91* 93* 92*  CO2 30  --  35* 33*  GLUCOSE 182* 178* 117* 79  BUN '14 17 16 18  '$ CREATININE 0.70 0.70 0.65 0.58  CALCIUM 8.9  --  8.6* 8.1*  MG  --   --  1.6* 2.0   Dg Chest Port 1 View  Result Date: 12/18/2017 CLINICAL DATA:  Hypoxia and shortness of breath EXAM: PORTABLE CHEST 1 VIEW COMPARISON:  Chest radiograph September 04, 2017 and chest CT September 04, 2017 FINDINGS: There is diffuse interstitial and alveolar opacity in a central distribution throughout the lungs bilaterally. There are pleural effusions bilaterally. There is cardiomegaly with pulmonary venous hypertension. No adenopathy. There is aortic atherosclerosis. No bone lesions. IMPRESSION: Pulmonary vascular congestion with pleural effusions. Diffuse interstitial alveolar opacity in a central/perihilar distribution, likely representing pulmonary edema. The overall appearance is felt to be most consistent with congestive heart failure. Areas of superimposed pneumonia in areas of alveolar opacity cannot be excluded. There is no adenopathy evident.  There is aortic atherosclerosis. Aortic Atherosclerosis (ICD10-I70.0). Electronically Signed   By: Lowella Grip III M.D.   On: 2017-12-18 12:03   Results/Tests Pending at Time of Death: None  Discharge Medications:  Allergies as of 2017-12-21      Reactions   Foradil [formoterol] Other (See Comments)   Reaction not recalled (??)  Medication List    ASK your doctor about these medications    acetaminophen 325 MG tablet Commonly known as:  TYLENOL Take 650 mg by mouth every 6 (six) hours as needed for moderate pain.   Calcium + D3 600-200 MG-UNIT Tabs Take 1 tablet by mouth daily.   collagenase ointment Commonly known as:  SANTYL Apply topically daily. Apply Santyl to sacrum and left buttock wound Q day, then cover with moist gauze and foam dressing.  (Change foam dressing Q 3 days or PRN soiling.)   digoxin 0.125 MG tablet Commonly known as:  LANOXIN Take 1 tablet (0.125 mg total) by mouth daily.   escitalopram 20 MG tablet Commonly known as:  LEXAPRO Take 20 mg by mouth daily.   feeding supplement (ENSURE ENLIVE) Liqd Take 237 mLs by mouth 3 (three) times daily between meals.   levothyroxine 137 MCG tablet Commonly known as:  SYNTHROID, LEVOTHROID Take 137 mcg by mouth daily before breakfast.   metoprolol succinate 100 MG 24 hr tablet Commonly known as:  TOPROL-XL Take 1 tablet (100 mg total) by mouth daily. Take with or immediately following a meal.   mirtazapine 7.5 MG tablet Commonly known as:  REMERON Take 7.5 mg by mouth at bedtime.   multivitamin with minerals Tabs tablet Take 1 tablet by mouth daily.   OXYGEN Inhale 2 L into the lungs continuous.   PROLIA 60 MG/ML Soln injection Generic drug:  denosumab Inject 60 mg into the skin every 6 (six) months.   warfarin 2 MG tablet Commonly known as:  COUMADIN '2mg'$  by mouth once daily       Rory Percy, DO 12-16-17, 6:20 PM PGY-1, [place]

## 2017-12-10 NOTE — Progress Notes (Addendum)
Family Medicine Teaching Service Daily Progress Note Intern Pager: [phone]  Patient name: Wendy Flowers Medical record number: [id number] Date of birth: 1927-03-06 Age: 84 y.o. Gender: female  Primary Care Provider: Deland Pretty, MD Consultants: Cardiology Code Status: DNR/DNI  Pt Overview and Major Events to Date:  4/11 - admitted for acute respiratory failure  Assessment and Plan: Wendy Flowers is a 84 y.o. female presenting with hypoxia, SOB, and decreased mental status. PMH is significant for HFpEF, afib on warfarin, HTN, PAH, dementia, HLD, hypothyroidism, kyphosis, h/o thoracic compression fracture and depression.  Acute Respiratory Failure  CHF exacerbation Placed on BiPAP last night due to persistent desats to 70-80s on NRB. Tolerating BiPAP currently with sats 90s%. Received IV Lasix 80mg  with not much UOP. Euvolemic on exam with cold extremities. Patient continues to be afebrile with no leukocytosis. PRN morphine provided for air hunger. Amiodarone started yesterday due to persistent tachycardia but held when heart rate dropped to 70s. Currently 80s off of amio. Home digoxin and metoprolol continued. No air hunger noted this am while on BiPAP. - continue IV Metoprolol 7.5 mg q6h and digoxin per Cardiology - currently holding amio drip  - palliative consulted, appreciate recs - cardiac monitoring - Cardiology consulted; appreciate recs  - s/p IV Lasix 40mg  x3, 80mg  IV x1   - continue BiPAP with hopes of transitioning back to NRB - continuous pulse ox - strict I&Os  AMS with Demenia Not responsive to voice, does not open eyes. Baseline cognitive impairment with difficulties with short term memory per nephew's wife on admission. On BiPAP currently. U/A not indicative of infection. SLP consulted who recommends continued NPO status - monitor mental status  - urine culture negative - reorientation as needed  Permanent Afib Chronically on Coumadin at home. INR 3.5 today.  -  heparin per pharmacy given NPO status  Hypothyroidism Stable, at home on Synthroid 137 mcg.  Last TSH wnl 2.5 in 08/2017.  - continue home Synthroid IV   Hyperlipidemia Not on statin. Given age and chronic debilitated status, would not recommend starting.  Depression Not agitated at time of exam. On Lexapro at home.  - hold home med given NPO status  FEN/GI: NPO, SLIV  Prophylaxis: heparin  Disposition: continue inpatient management of acute resp failure  Subjective:  Patient not arousable this morning, however tolerating BiPAP with improvement in sats.  Objective: Temp:  [97.6 F (36.4 C)-98.6 F (37 C)] 97.9 F (36.6 C) (04/13 2304) Pulse Rate:  [31-129] 31 (04/14 0825) Resp:  [12-25] 18 (04/14 0825) BP: (56-138)/(41-93) 61/44 (04/14 0825) SpO2:  [82 %-97 %] 83 % (04/14 0825) FiO2 (%):  [100 %] 100 % (04/14 0825) Weight:  [125 lb 10.6 oz (57 kg)-127 lb 10.3 oz (57.9 kg)] 127 lb 10.3 oz (57.9 kg) (04/14 0439) Physical Exam: General: elderly frail female lying in bed, not responsive to voice Cardiovascular: irregular rhythm, regular rate. no murmurs appreciated Respiratory:distant breath sounds, on BiPAP Abdomen: soft, NTND, +BS Extremities: nonpitting edematous ankles, cold lower extremities  Laboratory: Recent Labs  Lab 11/29/2017 1148 11/18/2017 1209 11/21/17 0341 11/22/17 0233  WBC 7.0  --  8.0 9.1  HGB 11.3* 12.2 11.2* 11.2*  HCT 36.7 36.0 37.6 37.2  PLT 289  --  283 277   Recent Labs  Lab 11/21/2017 1148 11/15/2017 1209 11/21/17 0341 11/22/17 0233  NA 134* 134* 138 137  K 4.1 4.1 3.6 3.4*  CL 93* 91* 93* 92*  CO2 30  --  35* 33*  BUN 14 17 16 18   CREATININE 0.70 0.70 0.65 0.58  CALCIUM 8.9  --  8.6* 8.1*  GLUCOSE 182* 178* 117* 79    Imaging/Diagnostic Tests: Dg Chest 1 View  Result Date: 11/22/2017 CLINICAL DATA:  Dyspnea EXAM: CHEST  1 VIEW COMPARISON:  11/29/2017 chest radiograph. FINDINGS: Limited chest radiographs with right rotated kyphotic  positioning. Stable cardiomediastinal silhouette with mild cardiomegaly. No pneumothorax. Small to moderate bilateral pleural effusions appear increased bilaterally. Moderate pulmonary edema appears worsened. Bibasilar atelectasis is worsened. IMPRESSION: 1. Worsened moderate congestive heart failure. 2. Increased small to moderate bilateral pleural effusions with worsened bibasilar atelectasis. Electronically Signed   By: Ilona Sorrel M.D.   On: 11/22/2017 19:08   Dg Chest Port 1 View  Result Date: 11/26/2017 CLINICAL DATA:  Hypoxia and shortness of breath EXAM: PORTABLE CHEST 1 VIEW COMPARISON:  Chest radiograph September 04, 2017 and chest CT September 04, 2017 FINDINGS: There is diffuse interstitial and alveolar opacity in a central distribution throughout the lungs bilaterally. There are pleural effusions bilaterally. There is cardiomegaly with pulmonary venous hypertension. No adenopathy. There is aortic atherosclerosis. No bone lesions. IMPRESSION: Pulmonary vascular congestion with pleural effusions. Diffuse interstitial alveolar opacity in a central/perihilar distribution, likely representing pulmonary edema. The overall appearance is felt to be most consistent with congestive heart failure. Areas of superimposed pneumonia in areas of alveolar opacity cannot be excluded. There is no adenopathy evident.  There is aortic atherosclerosis. Aortic Atherosclerosis (ICD10-I70.0). Electronically Signed   By: Lowella Grip III M.D.   On: 11/10/2017 12:03   Rory Percy, DO December 08, 2017, 8:36 AM PGY-1, [facility] Intern pager: [phone], text pages welcome

## 2017-12-10 NOTE — Progress Notes (Signed)
Was notified of Ms. Fontes's passing at 1036 by her nurse, and I confirmed death with exam.  Offered condolences to family at bedside.  Filled out death certificate and placed in patient's chart.

## 2017-12-10 NOTE — Progress Notes (Signed)
Dr. Shan Levans paged and alerted of patient's passing. Dr. Shan Levans came by room with family present and confirmed death. Comfort cart was ordered for family and this RN offered emotional support to family.

## 2017-12-10 NOTE — Progress Notes (Signed)
  Wendy Flowers's work phone number was called, message was left.  Return call was requested.

## 2017-12-10 NOTE — Progress Notes (Signed)
Family requested this nurse remove Bipap from patient. This RN educated family about the purpose of the Bipap and they agreed to remove Bipap. They requested the patient to transition to full comfort. This RN paged and alerted family medicine of their decision.

## 2017-12-10 NOTE — Progress Notes (Signed)
Placed pt on bipap. Will cont to monitor patient closely.

## 2017-12-10 NOTE — Progress Notes (Signed)
Attempted another contact number.  Wendy Flowers's that was found on the patient's paper chart.  No answer and voicemail not identifying the phone to be Susan's.  No voicemail left.

## 2017-12-10 NOTE — Progress Notes (Signed)
FPTS Interim Progress Note  Patient's heart rate dropping to 70s, amiodarone drip held. O2 sats dropping to 70s on NRB with some air hunger noted and distant lung sounds on exam. Last received Morphine around 8pm. Not responsive to voice. Nonpitting edema noted to ankles, will give one dose IV Lasix 40mg  and trial BiPAP to maintain sats until family is able to be at bedside. Multiple attempts to contact family by nurse, left voicemail.   Rory Percy, DO 12/15/2017, 12:30 AM PGY-1, [facility] pager [phone]

## 2017-12-10 DEATH — deceased

## 2019-03-18 IMAGING — CR DG LUMBAR SPINE COMPLETE 4+V
6 series · 6 of 6 positions shown · non-contrast
Comparison: CT lumbar spine dated May 13, 2017.

CLINICAL DATA: Lower back pain after fall.

EXAM:
LUMBAR SPINE - COMPLETE 4+ VIEW

[t lumbar spine ap]
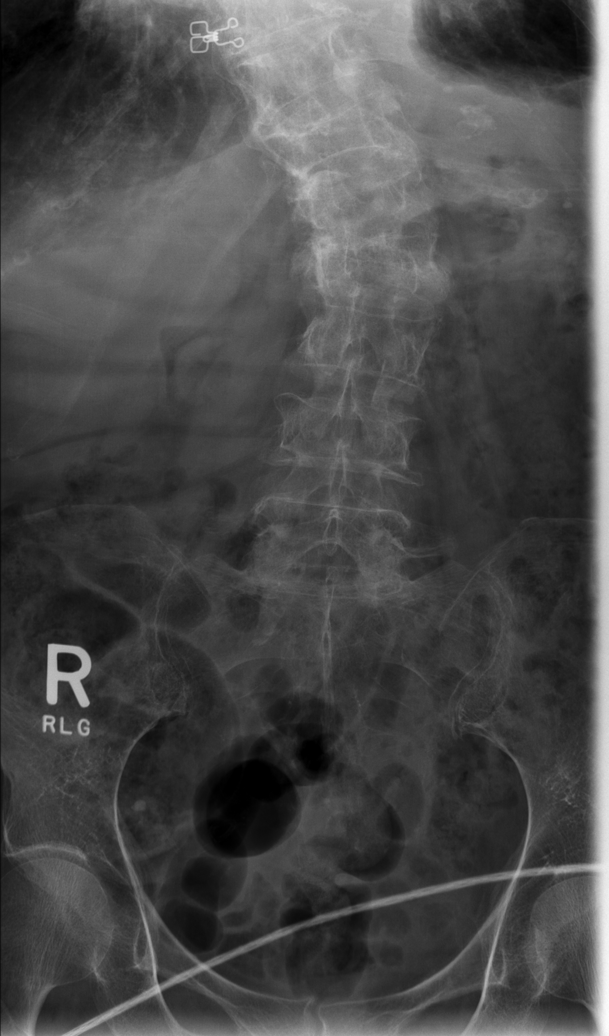

[t lumbar spine obl (1 of 2)]
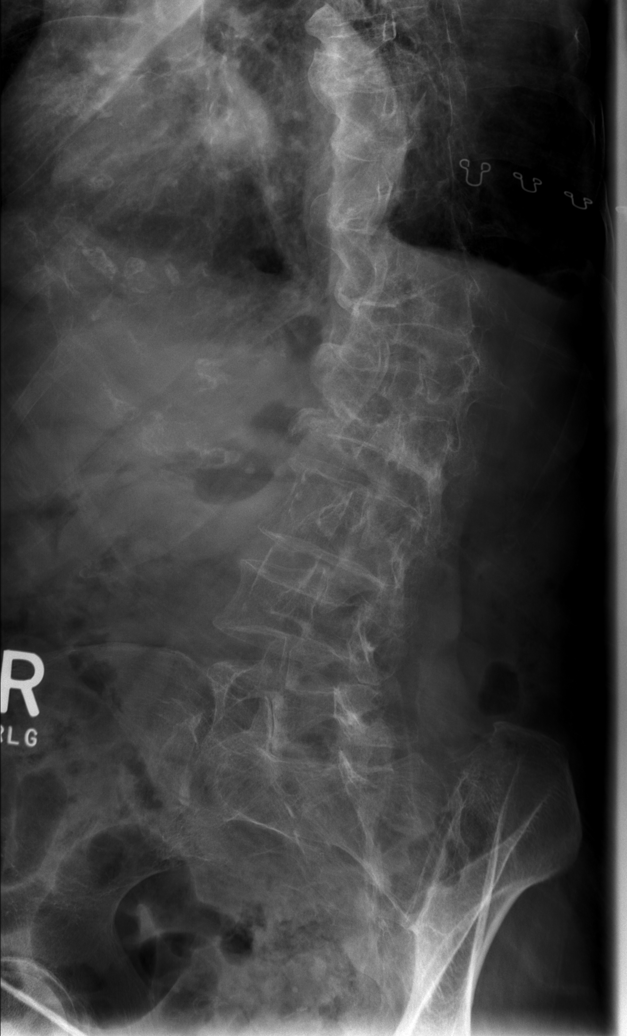

[t lumbar spine obl (2 of 2)]
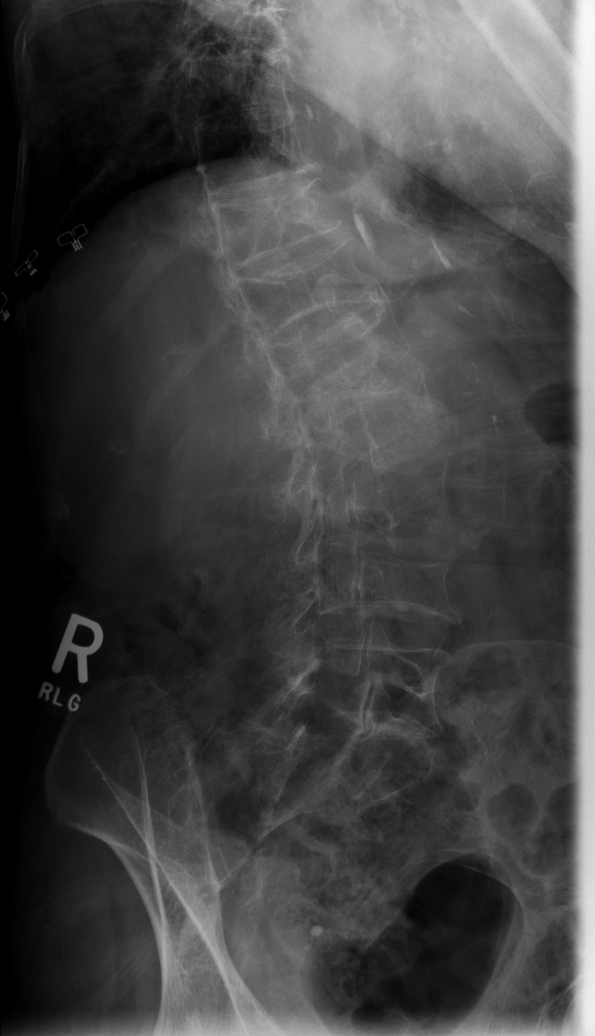

[t lumbar spine lat (1 of 2)]
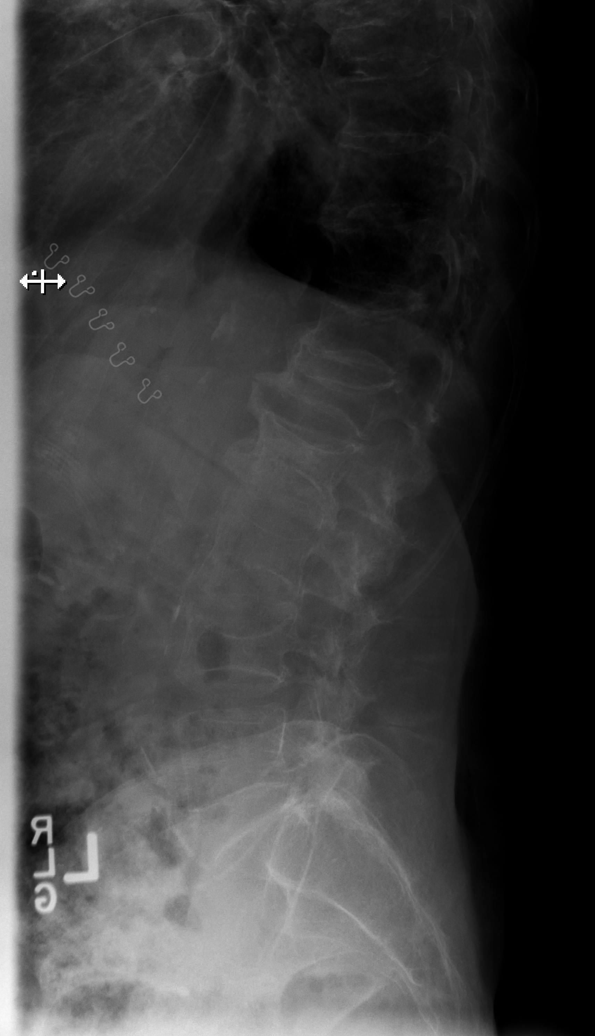

[t lumbar spine lat (2 of 2)]
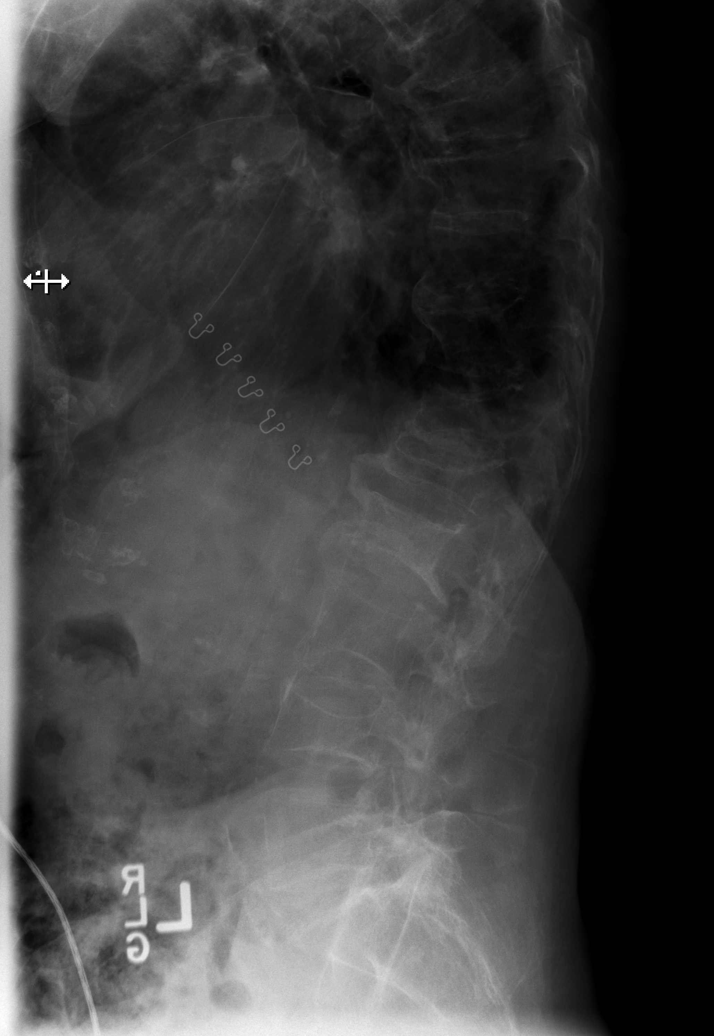

[t lumbar l-5 s-1 spot]
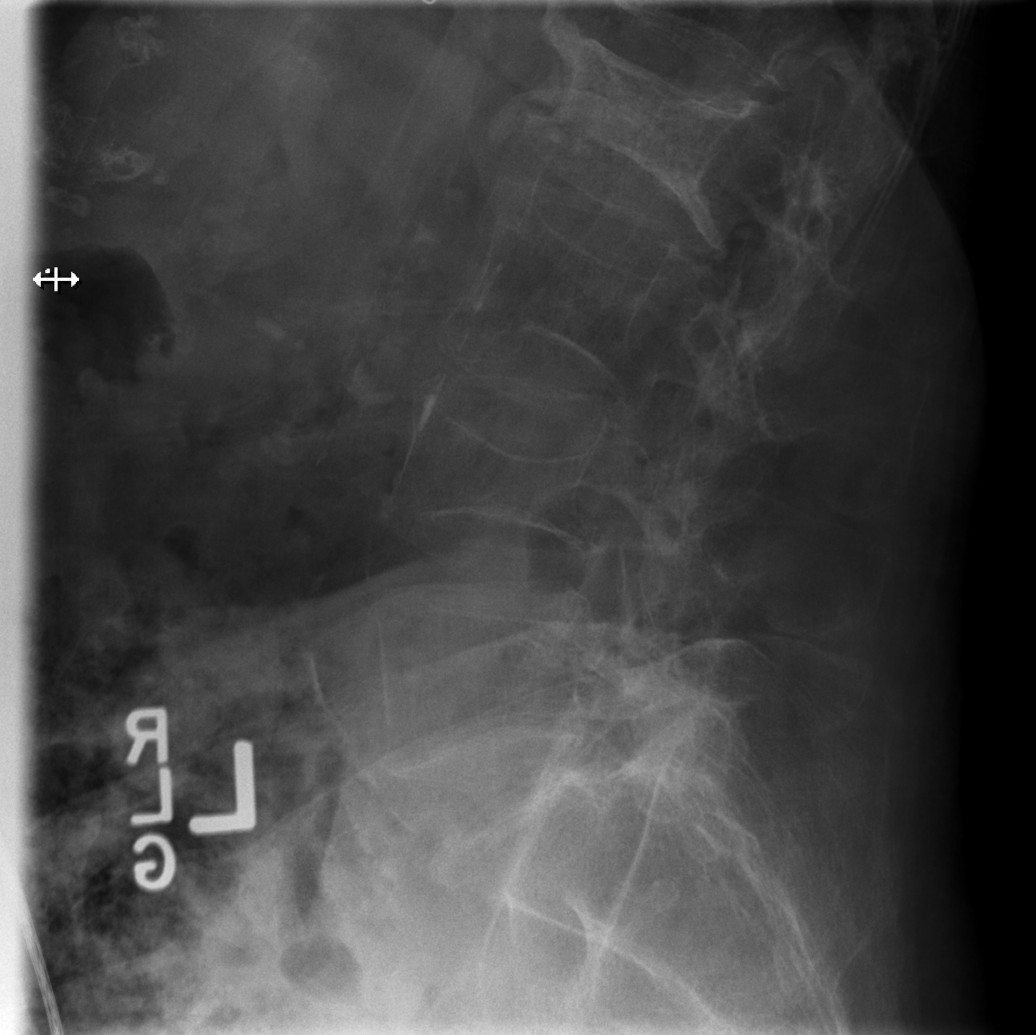

[6 of 6 positions shown; findings below may reference images not displayed]

FINDINGS: Unchanged levoscoliosis of the lower thoracic and upper lumbar
spine. Relatively unchanged compression deformities involving the
T12, L1, and L2 vertebral bodies. No definite new vertebral body
height loss. Alignment is normal. Mild lumbar facet arthropathy is
unchanged. The bones are diffusely osteopenic.
IMPRESSION: No definite acute compression deformity, although evaluation is
limited due to severe osteopenia and scoliosis. If there is strong
clinical concern for fracture, recommend CT for further evaluation.

## 2019-03-18 IMAGING — CR DG THORACIC SPINE 2V
2 series · 2 of 2 positions shown · non-contrast
Comparison: CT CHEST 02/05/2014

CLINICAL DATA: Fall back pain

EXAM:
THORACIC SPINE 2 VIEWS

[t thoracic spine ap]
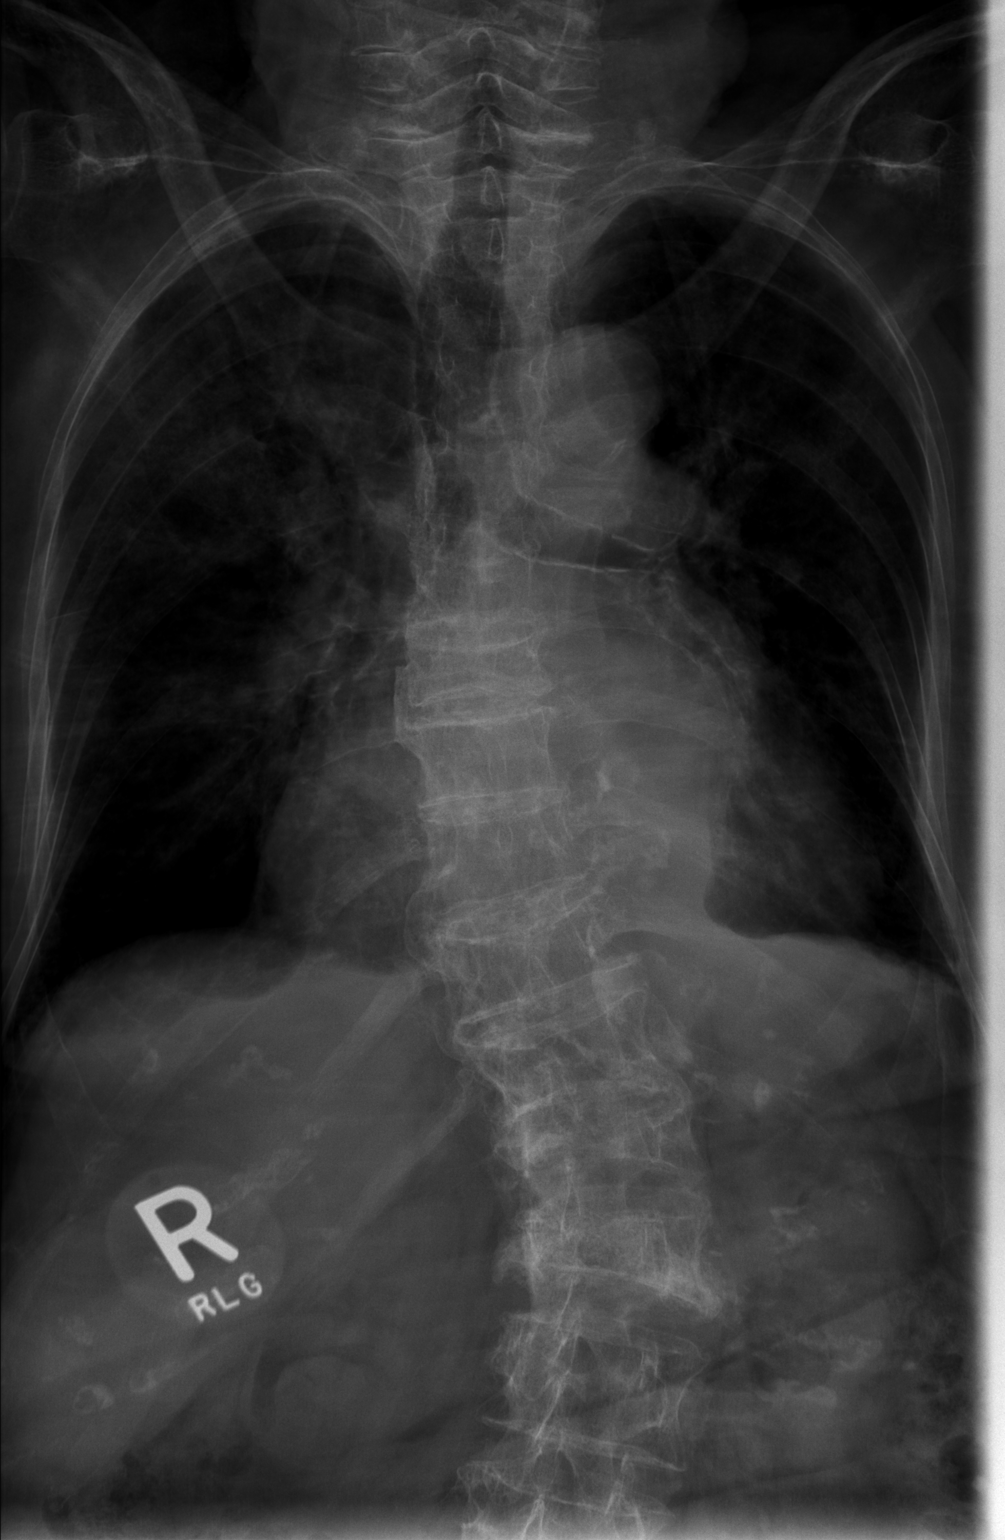

[t thoracic spine lat]
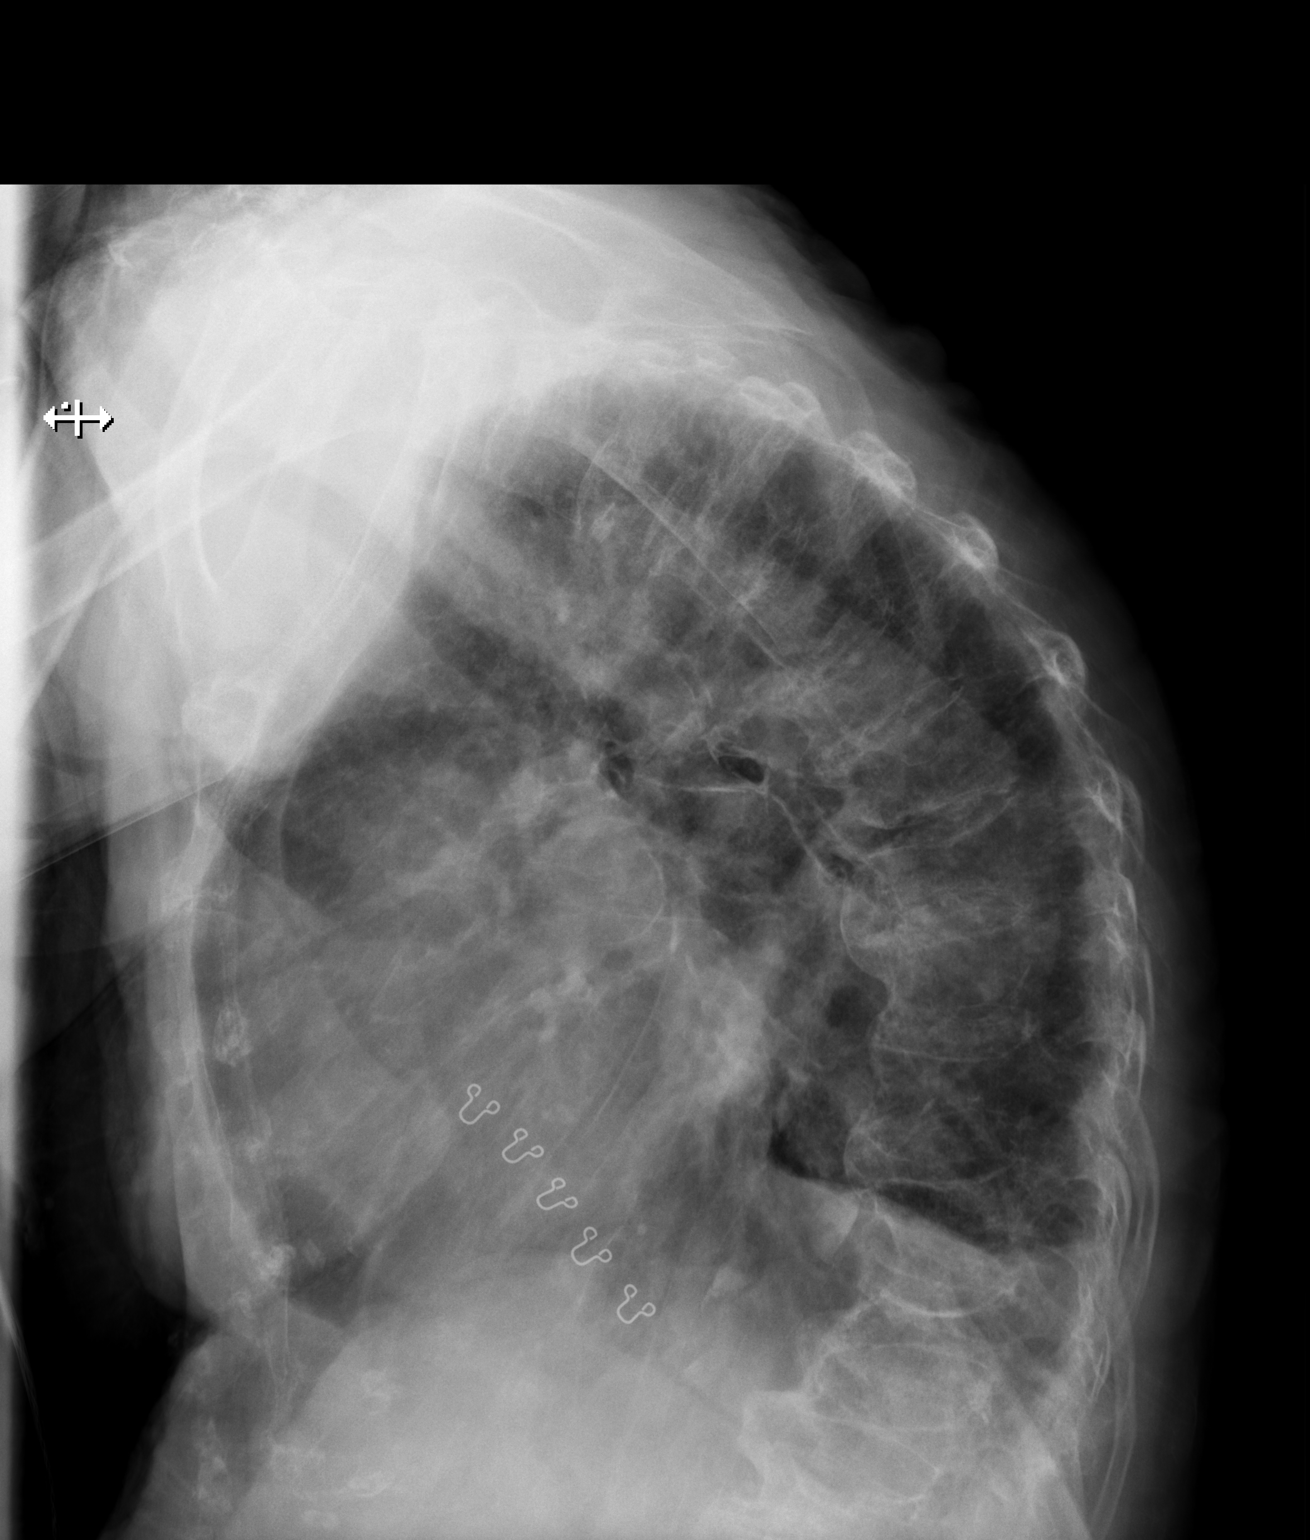

[2 of 2 positions shown; findings below may reference images not displayed]

FINDINGS: Thoracolumbar scoliosis. Generalized osteopenia. These factors limit
evaluation for fracture.

Compression fractures in the mid and lower thoracic spine including
T7, T8, T11 are noted on the prior study.
IMPRESSION: Image quality degraded by scoliosis and osteopenia. Multiple
thoracic fractures appear chronic. If the patient has acute pain,
MRI suggested for further evaluation.

## 2019-04-11 IMAGING — CR DG CHEST 2V
2 series · 2 of 2 positions shown · non-contrast
Comparison: 08/11/2017 CT and chest x-ray, chest x-ray 02/05/2014

CLINICAL DATA: Atrial fibrillation and shortness of breath

EXAM:
CHEST  2 VIEW

[chest lat]
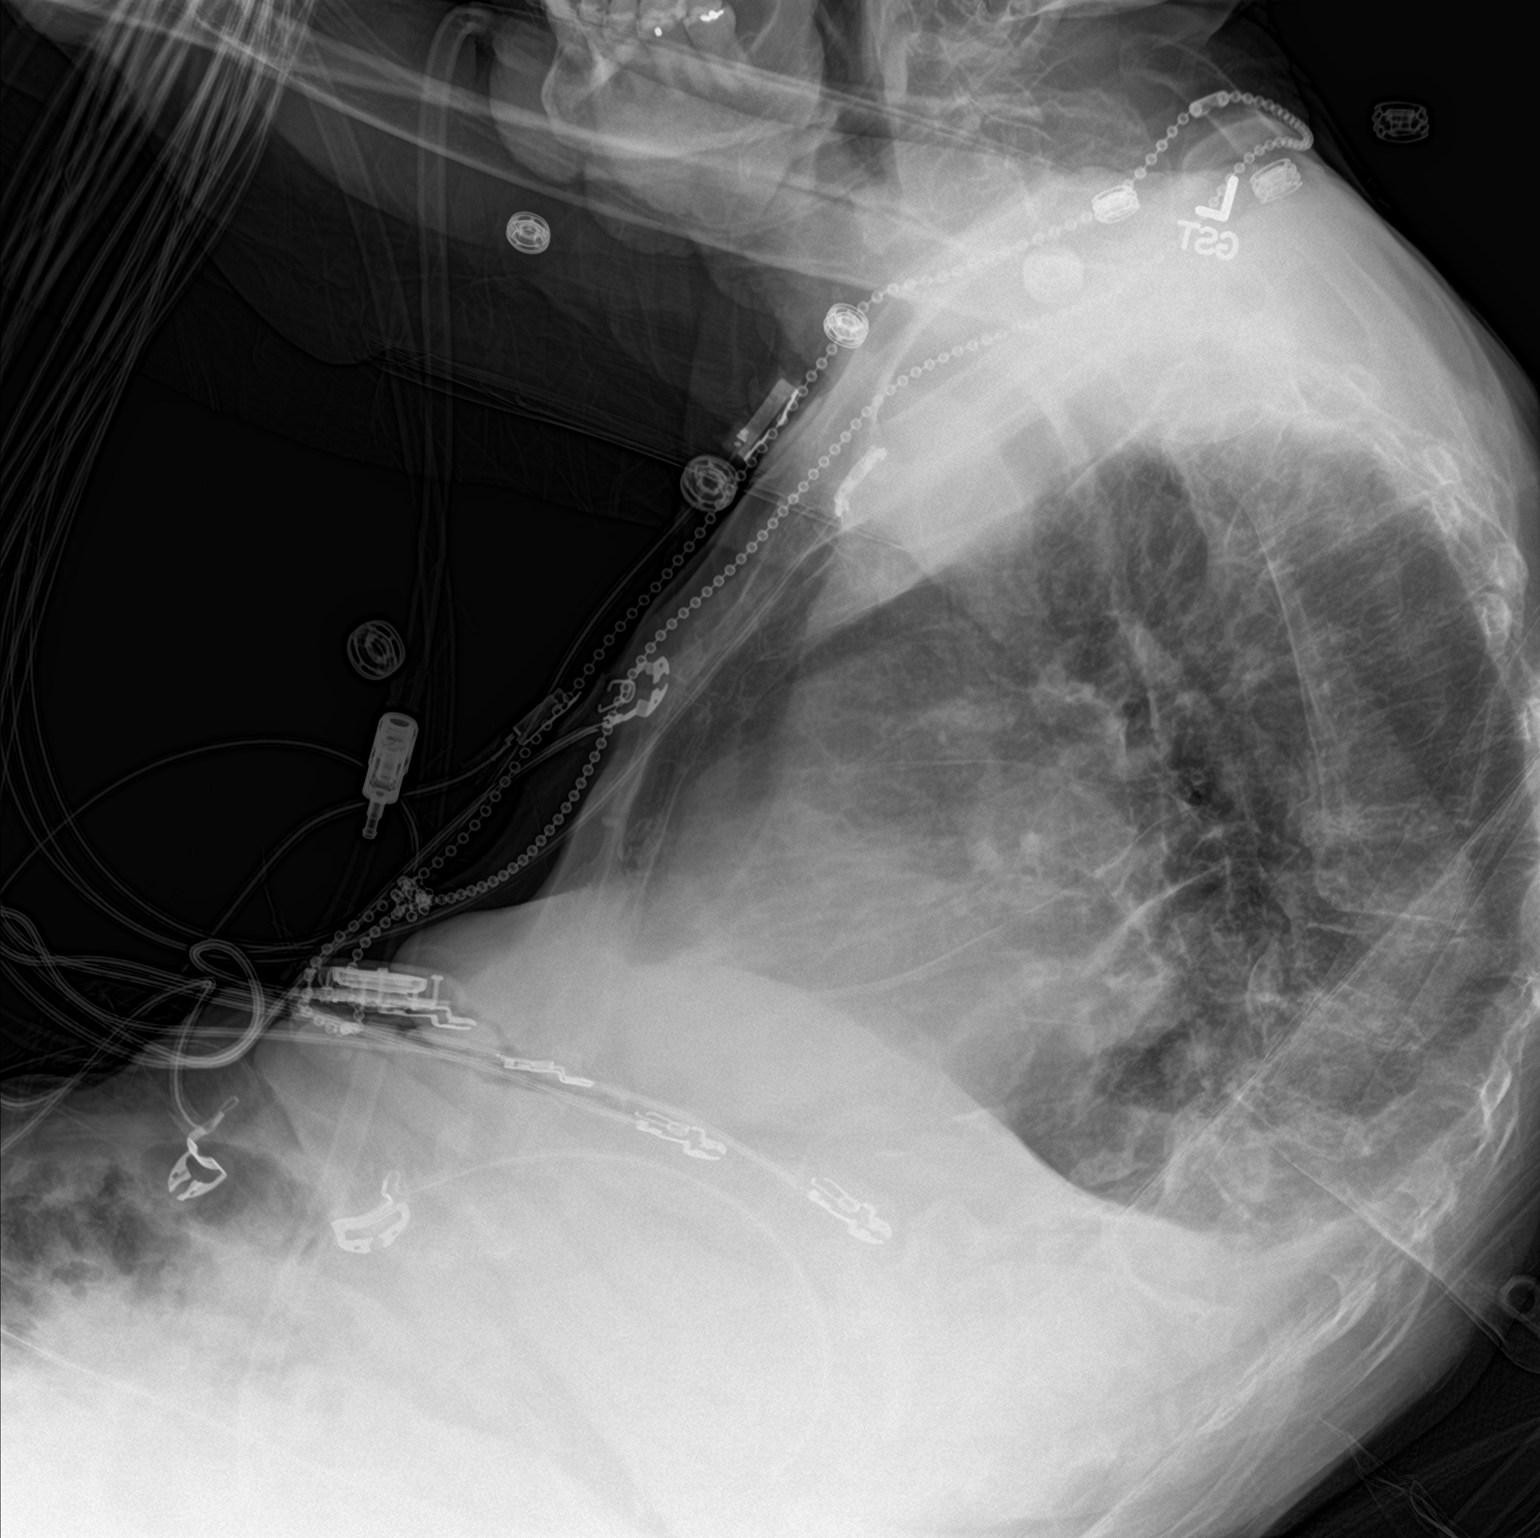

[chest ap]
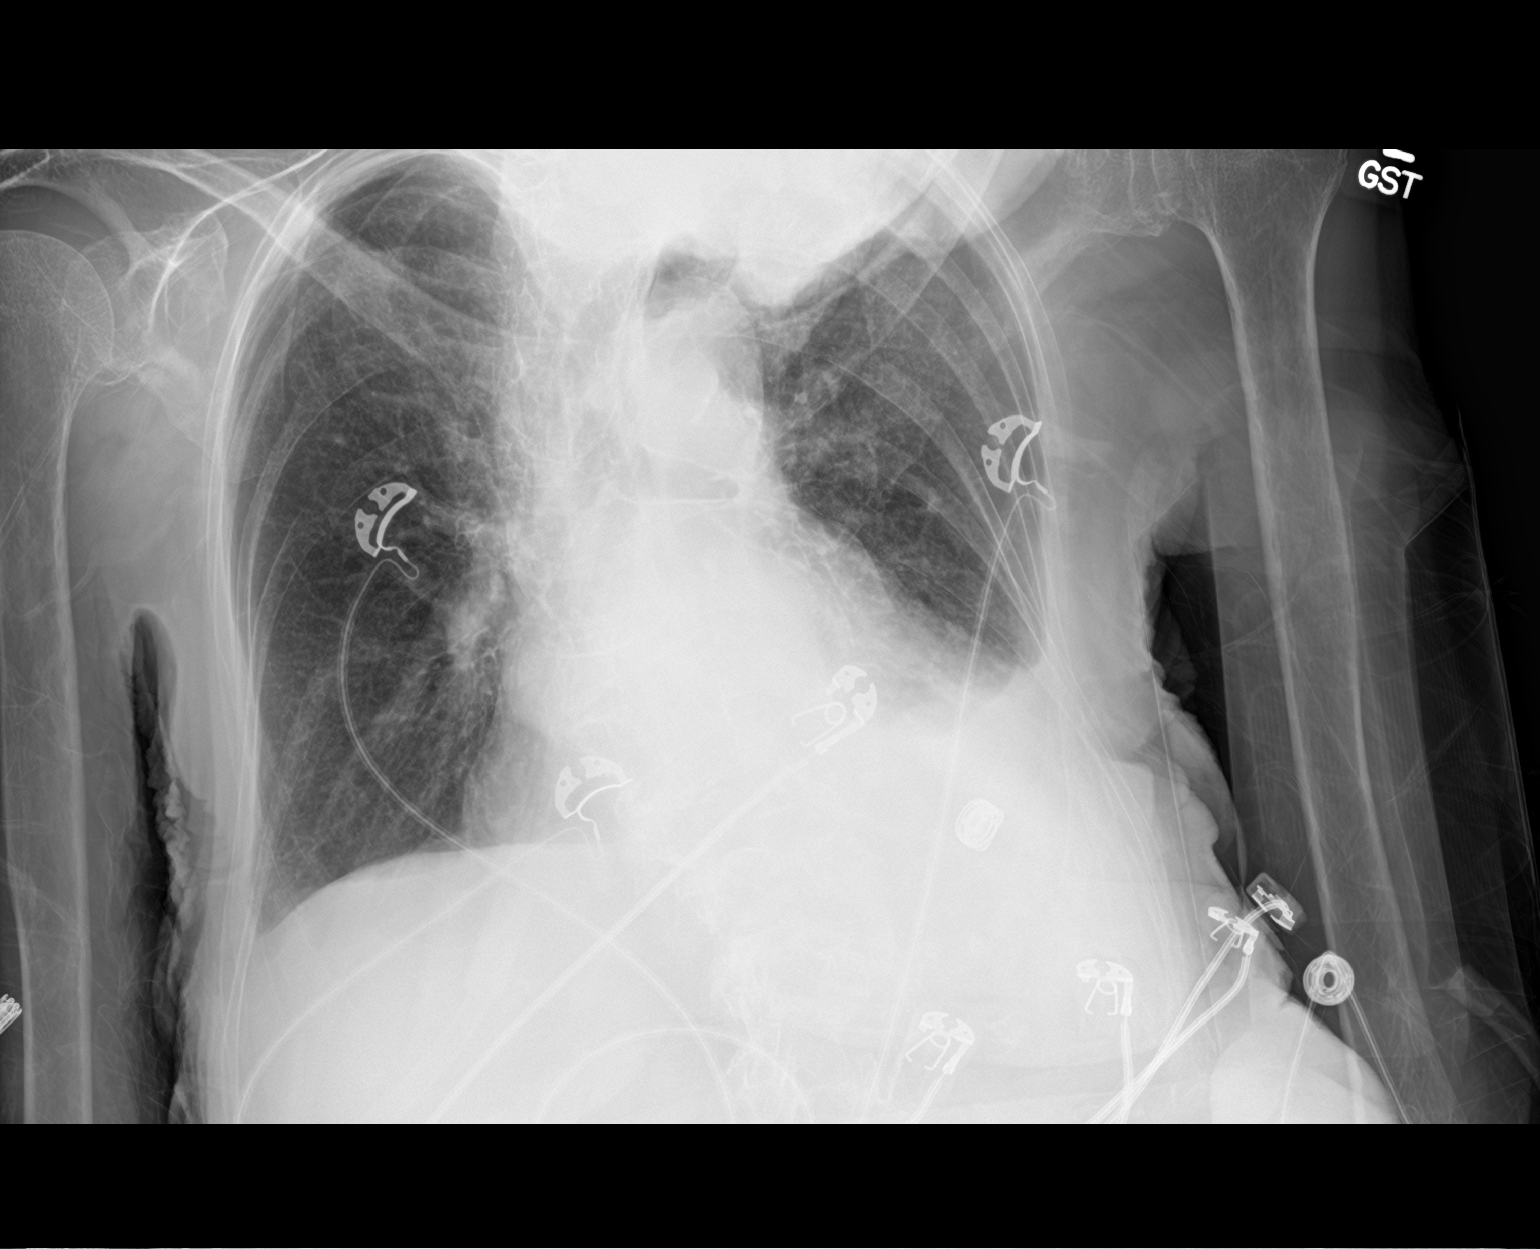

[2 of 2 positions shown; findings below may reference images not displayed]

FINDINGS: Cardiomegaly with small left greater than right pleural effusions.
Left basilar atelectasis or pneumonia. Aortic atherosclerosis.
Multiple compression deformities of the spine.
IMPRESSION: Cardiomegaly with small left greater than right pleural effusions
and left basilar atelectasis or pneumonia.
